# Patient Record
Sex: Female | Born: 1945 | Race: White | Hispanic: No | Marital: Single
Health system: Southern US, Community
[De-identification: ages and names within clinical notes are randomized; demographics above are authoritative.]

## PROBLEM LIST (undated history)

## (undated) DIAGNOSIS — M1990 Unspecified osteoarthritis, unspecified site: Secondary | ICD-10-CM

## (undated) DIAGNOSIS — H524 Presbyopia: Secondary | ICD-10-CM

## (undated) DIAGNOSIS — E119 Type 2 diabetes mellitus without complications: Secondary | ICD-10-CM

## (undated) DIAGNOSIS — E7800 Pure hypercholesterolemia, unspecified: Secondary | ICD-10-CM

## (undated) DIAGNOSIS — H269 Unspecified cataract: Secondary | ICD-10-CM

## (undated) DIAGNOSIS — F0390 Unspecified dementia without behavioral disturbance: Secondary | ICD-10-CM

## (undated) DIAGNOSIS — H9190 Unspecified hearing loss, unspecified ear: Secondary | ICD-10-CM

## (undated) DIAGNOSIS — I10 Essential (primary) hypertension: Secondary | ICD-10-CM

## (undated) DIAGNOSIS — G119 Hereditary ataxia, unspecified: Secondary | ICD-10-CM

## (undated) DIAGNOSIS — IMO0001 Reserved for inherently not codable concepts without codable children: Secondary | ICD-10-CM

## (undated) DIAGNOSIS — F29 Unspecified psychosis not due to a substance or known physiological condition: Secondary | ICD-10-CM

## (undated) DIAGNOSIS — Z794 Long term (current) use of insulin: Secondary | ICD-10-CM

## (undated) HISTORY — DX: Presbyopia: H52.4

## (undated) HISTORY — DX: Unspecified osteoarthritis, unspecified site: M19.90

## (undated) HISTORY — PX: OTHER SURGICAL HISTORY: SHX169

## (undated) HISTORY — DX: Essential (primary) hypertension: I10

---

## 1997-10-23 ENCOUNTER — Encounter (HOSPITAL_COMMUNITY): Admission: RE | Admit: 1997-10-23 | Discharge: 1998-01-21 | Payer: Self-pay | Admitting: Family Medicine

## 1999-06-06 ENCOUNTER — Emergency Department (HOSPITAL_COMMUNITY): Admission: EM | Admit: 1999-06-06 | Discharge: 1999-06-06 | Payer: Self-pay | Admitting: Emergency Medicine

## 2000-05-10 ENCOUNTER — Encounter: Payer: Self-pay | Admitting: Family Medicine

## 2000-05-10 ENCOUNTER — Encounter: Admission: RE | Admit: 2000-05-10 | Discharge: 2000-05-10 | Payer: Self-pay | Admitting: Family Medicine

## 2001-05-16 ENCOUNTER — Encounter: Payer: Self-pay | Admitting: Family Medicine

## 2001-05-16 ENCOUNTER — Encounter: Admission: RE | Admit: 2001-05-16 | Discharge: 2001-05-16 | Payer: Self-pay | Admitting: Family Medicine

## 2001-10-05 ENCOUNTER — Ambulatory Visit (HOSPITAL_COMMUNITY): Admission: RE | Admit: 2001-10-05 | Discharge: 2001-10-05 | Payer: Self-pay | Admitting: Family Medicine

## 2001-10-05 ENCOUNTER — Encounter: Payer: Self-pay | Admitting: Family Medicine

## 2002-08-15 ENCOUNTER — Encounter: Payer: Self-pay | Admitting: Family Medicine

## 2002-08-15 ENCOUNTER — Encounter: Admission: RE | Admit: 2002-08-15 | Discharge: 2002-08-15 | Payer: Self-pay | Admitting: Family Medicine

## 2003-08-18 ENCOUNTER — Encounter: Admission: RE | Admit: 2003-08-18 | Discharge: 2003-08-18 | Payer: Self-pay | Admitting: Family Medicine

## 2004-03-25 ENCOUNTER — Encounter: Admission: RE | Admit: 2004-03-25 | Discharge: 2004-03-25 | Payer: Self-pay | Admitting: General Surgery

## 2004-05-26 ENCOUNTER — Ambulatory Visit (HOSPITAL_COMMUNITY): Admission: RE | Admit: 2004-05-26 | Discharge: 2004-05-26 | Payer: Self-pay | Admitting: General Surgery

## 2004-05-26 ENCOUNTER — Encounter (INDEPENDENT_AMBULATORY_CARE_PROVIDER_SITE_OTHER): Payer: Self-pay | Admitting: *Deleted

## 2005-07-22 ENCOUNTER — Other Ambulatory Visit: Admission: RE | Admit: 2005-07-22 | Discharge: 2005-07-22 | Payer: Self-pay | Admitting: General Surgery

## 2008-06-16 ENCOUNTER — Encounter: Admission: RE | Admit: 2008-06-16 | Discharge: 2008-06-16 | Payer: Self-pay | Admitting: Otolaryngology

## 2008-11-21 ENCOUNTER — Encounter: Admission: RE | Admit: 2008-11-21 | Discharge: 2008-11-21 | Payer: Self-pay | Admitting: Family Medicine

## 2009-01-22 ENCOUNTER — Encounter: Admission: RE | Admit: 2009-01-22 | Discharge: 2009-01-22 | Payer: Self-pay | Admitting: Internal Medicine

## 2009-05-20 ENCOUNTER — Encounter: Admission: RE | Admit: 2009-05-20 | Discharge: 2009-05-20 | Payer: Self-pay | Admitting: Family Medicine

## 2009-11-13 ENCOUNTER — Ambulatory Visit: Payer: Self-pay | Admitting: Cardiology

## 2009-11-13 ENCOUNTER — Inpatient Hospital Stay (HOSPITAL_COMMUNITY): Admission: EM | Admit: 2009-11-13 | Discharge: 2009-11-17 | Payer: Self-pay | Admitting: Emergency Medicine

## 2009-11-27 DIAGNOSIS — E785 Hyperlipidemia, unspecified: Secondary | ICD-10-CM

## 2009-11-30 ENCOUNTER — Ambulatory Visit: Payer: Self-pay | Admitting: Cardiology

## 2009-12-15 ENCOUNTER — Encounter: Admission: RE | Admit: 2009-12-15 | Discharge: 2009-12-15 | Payer: Self-pay | Admitting: Family Medicine

## 2009-12-22 ENCOUNTER — Encounter (INDEPENDENT_AMBULATORY_CARE_PROVIDER_SITE_OTHER): Payer: Self-pay | Admitting: *Deleted

## 2009-12-22 ENCOUNTER — Ambulatory Visit: Payer: Self-pay | Admitting: Internal Medicine

## 2009-12-22 DIAGNOSIS — F79 Unspecified intellectual disabilities: Secondary | ICD-10-CM

## 2009-12-22 DIAGNOSIS — I495 Sick sinus syndrome: Secondary | ICD-10-CM | POA: Insufficient documentation

## 2009-12-22 DIAGNOSIS — F0390 Unspecified dementia without behavioral disturbance: Secondary | ICD-10-CM

## 2010-06-23 ENCOUNTER — Emergency Department (HOSPITAL_COMMUNITY)
Admission: EM | Admit: 2010-06-23 | Discharge: 2010-06-23 | Payer: Self-pay | Source: Home / Self Care | Admitting: Emergency Medicine

## 2010-08-24 NOTE — Procedures (Signed)
Summary: summary report  summary report   Imported By: Mirna Mires 12/14/2009 14:56:22  _____________________________________________________________________  External Attachment:    Type:   Image     Comment:   External Document

## 2010-08-24 NOTE — Miscellaneous (Signed)
  Clinical Lists Changes  Medications: Added new medication of PROVIGIL 200 MG TABS (MODAFINIL) take one daily - Signed Added new medication of SIMVASTATIN 10 MG TABS (SIMVASTATIN) take one daily Added new medication of GALANTAMINE HYDROBROMIDE 8 MG TABS (GALANTAMINE HYDROBROMIDE) take one twice daily Added new medication of NAMENDA 10 MG TABS (MEMANTINE HCL) take one daily

## 2010-08-24 NOTE — Assessment & Plan Note (Signed)
Summary: eph/  f/u 48 hrs holter/ gd   Visit Type:  Follow-up   History of Present Illness: This is a 65 year old female patient with advanced dementia, mental retardation, and severe hearing loss. She was brought to the emergency room because she was found to be bradycardic on routine vital checks. It was felt her bradycardia was likely due to the use of Galantamine which was stopped in the hospital.  She had a Holter monitor as an outpatient which her heart rates range from 48-118 with occasional PVCs, PACs but no pauses or arrhythmias. The patient is asymptomatic with this. She is brought today with an aid and denies complaints.  Problems Prior to Update: 1)  Hyperlipidemia  (ICD-272.4)  Current Medications (verified): 1)  Provigil 200 Mg Tabs (Modafinil) .... Take One Daily 2)  Simvastatin 10 Mg Tabs (Simvastatin) .... Take One Daily 3)  Namenda 10 Mg Tabs (Memantine Hcl) .... Take One Daily 4)  Multivitamins  Caps (Multiple Vitamin) .... Take One Daily 5)  Omeprazole 20 Mg Cpdr (Omeprazole) .... Take One Daily 6)  Zilactin 10 % Gel (Benzyl Alcohol) .... Apply Small Amt. On Tip of Tongue Three Times Daily As Needed 7)  Docusate Sodium 100 Mg Caps (Docusate Sodium) .... Take One Twice Daily  Family History: Reviewed history from 11/27/2009 and no changes required.  Noncontributory.      Social History: Reviewed history from 11/27/2009 and no changes required.   The patient currently lives in a group home in   [place], where she has lived since 1993.      Review of Systems       Patient complains of being cold all the time, and shoulder pain from a fall in the bathroom.  Vital Signs:  Patient profile:   65 year old female Height:      65 inches Weight:      136 pounds BMI:     22.71 Pulse rate:   72 / minute BP sitting:   120 / 66  (right arm)  Vitals Entered By: Jacquelin Hawking, CMA (Dec 22, 2009 1:24 PM)  Physical Exam  General:   Well-nournished, in no acute  distress. Neck: No JVD, HJR, Bruit, or thyroid enlargement Lungs: No tachypnea, clear without wheezing, rales, or rhonchi Cardiovascular: RRR, PMI not displaced, heart sounds normal, no murmurs, gallops, bruit, thrill, or heave. Abdomen: BS normal. Soft without organomegaly, masses, lesions or tenderness. Extremities: without cyanosis, clubbing or edema. Good distal pulses bilateral SKin: Warm, no lesions or rashes  Musculoskeletal: No deformities Neuro: no focal signs    Impression & Recommendations:  Problem # 1:  SINUS BRADYCARDIA (ICD-427.81) Patient had asymptomatic sinus bradycardia which was likely due to the use of galantamine. This was stopped. In reviewing her Holter monitor earlier this month she had heart rates from 48-118 with occasional PVCs and PACs and no pauses. We will continue to hold this medication.  Problem # 2:  MENTAL RETARDATION (ICD-319)  Problem # 3:  DEMENTIA (ICD-294.8)  Problem # 4:  HYPERLIPIDEMIA (ICD-272.4) treated Her updated medication list for this problem includes:    Simvastatin 10 Mg Tabs (Simvastatin) .Marland Kitchen... Take one daily  Patient Instructions: 1)  followup with Dr. Bonnee Quin as needed.

## 2010-10-12 LAB — BASIC METABOLIC PANEL
BUN: 16 mg/dL (ref 6–23)
CO2: 28 mEq/L (ref 19–32)
Glucose, Bld: 101 mg/dL — ABNORMAL HIGH (ref 70–99)
Potassium: 3.7 mEq/L (ref 3.5–5.1)
Sodium: 139 mEq/L (ref 135–145)

## 2010-10-12 LAB — COMPREHENSIVE METABOLIC PANEL
AST: 16 U/L (ref 0–37)
Albumin: 3.7 g/dL (ref 3.5–5.2)
CO2: 30 mEq/L (ref 19–32)
Chloride: 107 mEq/L (ref 96–112)
GFR calc Af Amer: >60 mL/min (ref >60)
Potassium: 3.4 mEq/L — ABNORMAL LOW (ref 3.5–5.1)
Sodium: 140 mEq/L (ref 135–145)
Total Protein: 6.4 g/dL (ref 6.0–8.3)

## 2010-10-12 LAB — DIFFERENTIAL
Basophils Relative: 1 % (ref 0–1)
Eosinophils Relative: 2 % (ref 0–5)
Monocytes Absolute: 0.5 10*3/uL (ref 0.1–1.0)
Neutro Abs: 1.9 10*3/uL (ref 1.7–7.7)
Neutrophils Relative %: 35 % — ABNORMAL LOW (ref 43–77)

## 2010-10-12 LAB — TROPONIN I: Troponin I: 0.01 ng/mL (ref 0.00–0.06)

## 2010-10-12 LAB — CARDIAC PANEL(CRET KIN+CKTOT+MB+TROPI)
CK, MB: 0.9 ng/mL (ref 0.3–4.0)
Total CK: 46 U/L (ref 7–177)
Troponin I: 0.01 ng/mL (ref 0.00–0.06)

## 2010-10-12 LAB — CBC
MCV: 84.7 fL (ref 78.0–100.0)
Platelets: 174 10*3/uL (ref 150–400)
RBC: 4.3 MIL/uL (ref 3.87–5.11)
WBC: 5.6 10*3/uL (ref 4.0–10.5)

## 2010-10-12 LAB — T4, FREE: Free T4: 1.02 ng/dL (ref 0.80–1.80)

## 2010-10-12 LAB — T3, FREE: T3, Free: 3.4 pg/mL (ref 2.3–4.2)

## 2010-10-12 LAB — POCT CARDIAC MARKERS
Myoglobin, poc: 47.2 ng/mL (ref 12–200)
Troponin i, poc: <0.05 ng/mL (ref 0.00–0.09)

## 2010-11-15 ENCOUNTER — Other Ambulatory Visit: Payer: Self-pay | Admitting: Family Medicine

## 2010-11-15 DIAGNOSIS — Z1231 Encounter for screening mammogram for malignant neoplasm of breast: Secondary | ICD-10-CM

## 2010-12-10 NOTE — Op Note (Signed)
Sherri Gross, ROZAR               ACCOUNT NO.:  [id number]   MEDICAL RECORD NO.:  [id number]          PATIENT TYPE:  AMB   LOCATION:  DAY                          FACILITY:  [facility]   PHYSICIAN:  Ollen Gross. [name] Morgans, M.D. 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   POSTOPERATIVE DIAGNOSIS:  Sarcoma of the right arm.   PROCEDURE:  Wide excision of sarcoma of the right arm.   SURGEON:  Ollen Gross. Carolynne Edouard, M.D.   ANESTHESIA:  General endotracheal.   PROCEDURE:  After informed consent was obtained, the patient was brought to  the operating room and placed in a supine position on the operating room  table.  After adequate induction of general anesthesia, the patient was then  placed in a lateral position with the left side down.  All pressure points  were padded.  The patient's right upper arm and shoulder area was prepped  with Betadine and draped in the usual sterile manner.  An approximately 1.5  to 2 cm was measured out all around her previous scar.  This area was then  excised sharply and in an elliptical fashion with a 15 blade knife and  electrocautery.  The dissection was taken down through the subcutaneous fat,  and the muscular fascia was taken with the specimen.  This was all done  sharply with the electrocautery.  Once the specimen had been completely  excised and removed, the specimen was sent to pathology for further  evaluation and oriented.  The wound was then examined and found to be  hemostatic.  The wound was then closed with a combination of interrupted 3-0  nylon vertical mattress stitches as well as simple 3-0 nylon interrupted  stitches.  Neosporin and sterile dressings were then applied.  The patient  tolerated the procedure well.  At the end of the case, all needle, sponge,  and instrument counts were correct.  Patient was then  awakened and taken to  the recovery room in stable condition.      PST/MEDQ  D:  05/27/2004  T:  05/27/2004  Job:  [id number]

## 2010-12-21 ENCOUNTER — Ambulatory Visit
Admission: RE | Admit: 2010-12-21 | Discharge: 2010-12-21 | Disposition: A | Discharge status: Home / Self Care | Payer: Medicare Other | Source: Ambulatory Visit | Attending: Family Medicine | Admitting: Family Medicine

## 2010-12-21 DIAGNOSIS — Z1231 Encounter for screening mammogram for malignant neoplasm of breast: Secondary | ICD-10-CM

## 2011-01-22 ENCOUNTER — Emergency Department (HOSPITAL_COMMUNITY)
Admission: EM | Admit: 2011-01-22 | Discharge: 2011-01-22 | Disposition: A | Discharge status: Home / Self Care | Payer: Medicare Other | Attending: Emergency Medicine | Admitting: Emergency Medicine

## 2011-01-22 ENCOUNTER — Emergency Department (HOSPITAL_COMMUNITY): Payer: Medicare Other

## 2011-01-22 DIAGNOSIS — G309 Alzheimer's disease, unspecified: Secondary | ICD-10-CM | POA: Insufficient documentation

## 2011-01-22 DIAGNOSIS — M25519 Pain in unspecified shoulder: Secondary | ICD-10-CM | POA: Insufficient documentation

## 2011-01-22 DIAGNOSIS — F0280 Dementia in other diseases classified elsewhere without behavioral disturbance: Secondary | ICD-10-CM | POA: Insufficient documentation

## 2011-01-22 DIAGNOSIS — S40019A Contusion of unspecified shoulder, initial encounter: Secondary | ICD-10-CM | POA: Insufficient documentation

## 2011-01-22 DIAGNOSIS — W19XXXA Unspecified fall, initial encounter: Secondary | ICD-10-CM | POA: Insufficient documentation

## 2011-01-22 DIAGNOSIS — F79 Unspecified intellectual disabilities: Secondary | ICD-10-CM | POA: Insufficient documentation

## 2011-11-03 ENCOUNTER — Other Ambulatory Visit: Payer: Self-pay | Admitting: Family Medicine

## 2011-11-03 DIAGNOSIS — Z1231 Encounter for screening mammogram for malignant neoplasm of breast: Secondary | ICD-10-CM

## 2011-12-26 ENCOUNTER — Ambulatory Visit
Admission: RE | Admit: 2011-12-26 | Discharge: 2011-12-26 | Disposition: A | Discharge status: Home / Self Care | Payer: Medicare Other | Source: Ambulatory Visit | Attending: Family Medicine | Admitting: Family Medicine

## 2011-12-26 DIAGNOSIS — Z1231 Encounter for screening mammogram for malignant neoplasm of breast: Secondary | ICD-10-CM

## 2012-04-03 ENCOUNTER — Ambulatory Visit (HOSPITAL_COMMUNITY)
Admission: RE | Admit: 2012-04-03 | Discharge: 2012-04-03 | Disposition: A | Discharge status: Home / Self Care | Payer: Medicare Other | Source: Ambulatory Visit | Attending: Family Medicine | Admitting: Family Medicine

## 2012-04-03 ENCOUNTER — Ambulatory Visit (HOSPITAL_COMMUNITY): Payer: Medicare Other

## 2012-04-03 DIAGNOSIS — R079 Chest pain, unspecified: Secondary | ICD-10-CM | POA: Insufficient documentation

## 2012-10-23 ENCOUNTER — Other Ambulatory Visit: Payer: Self-pay | Admitting: Family Medicine

## 2012-10-23 DIAGNOSIS — Z1231 Encounter for screening mammogram for malignant neoplasm of breast: Secondary | ICD-10-CM

## 2012-12-28 ENCOUNTER — Ambulatory Visit: Payer: Medicare Other

## 2012-12-31 ENCOUNTER — Ambulatory Visit
Admission: RE | Admit: 2012-12-31 | Discharge: 2012-12-31 | Disposition: A | Discharge status: Home / Self Care | Payer: Medicare Other | Source: Ambulatory Visit | Attending: Family Medicine | Admitting: Family Medicine

## 2012-12-31 DIAGNOSIS — Z1231 Encounter for screening mammogram for malignant neoplasm of breast: Secondary | ICD-10-CM

## 2013-05-29 ENCOUNTER — Emergency Department (HOSPITAL_COMMUNITY)
Admission: EM | Admit: 2013-05-29 | Discharge: 2013-05-29 | Disposition: A | Discharge status: Home / Self Care | Payer: Medicare Other | Attending: Emergency Medicine | Admitting: Emergency Medicine

## 2013-05-29 ENCOUNTER — Emergency Department (HOSPITAL_COMMUNITY): Payer: Medicare Other

## 2013-05-29 ENCOUNTER — Encounter (HOSPITAL_COMMUNITY): Payer: Self-pay | Admitting: Emergency Medicine

## 2013-05-29 DIAGNOSIS — Z794 Long term (current) use of insulin: Secondary | ICD-10-CM | POA: Insufficient documentation

## 2013-05-29 DIAGNOSIS — X58XXXA Exposure to other specified factors, initial encounter: Secondary | ICD-10-CM | POA: Insufficient documentation

## 2013-05-29 DIAGNOSIS — Z8639 Personal history of other endocrine, nutritional and metabolic disease: Secondary | ICD-10-CM | POA: Insufficient documentation

## 2013-05-29 DIAGNOSIS — Y929 Unspecified place or not applicable: Secondary | ICD-10-CM | POA: Insufficient documentation

## 2013-05-29 DIAGNOSIS — H9190 Unspecified hearing loss, unspecified ear: Secondary | ICD-10-CM | POA: Insufficient documentation

## 2013-05-29 DIAGNOSIS — IMO0002 Reserved for concepts with insufficient information to code with codable children: Secondary | ICD-10-CM | POA: Insufficient documentation

## 2013-05-29 DIAGNOSIS — E119 Type 2 diabetes mellitus without complications: Secondary | ICD-10-CM | POA: Insufficient documentation

## 2013-05-29 DIAGNOSIS — Y939 Activity, unspecified: Secondary | ICD-10-CM | POA: Insufficient documentation

## 2013-05-29 DIAGNOSIS — F0390 Unspecified dementia without behavioral disturbance: Secondary | ICD-10-CM | POA: Insufficient documentation

## 2013-05-29 DIAGNOSIS — IMO0001 Reserved for inherently not codable concepts without codable children: Secondary | ICD-10-CM

## 2013-05-29 DIAGNOSIS — Z862 Personal history of diseases of the blood and blood-forming organs and certain disorders involving the immune mechanism: Secondary | ICD-10-CM | POA: Insufficient documentation

## 2013-05-29 HISTORY — DX: Long term (current) use of insulin: Z79.4

## 2013-05-29 HISTORY — DX: Unspecified cataract: H26.9

## 2013-05-29 HISTORY — DX: Unspecified hearing loss, unspecified ear: H91.90

## 2013-05-29 HISTORY — DX: Hereditary ataxia, unspecified: G11.9

## 2013-05-29 HISTORY — DX: Unspecified psychosis not due to a substance or known physiological condition: F29

## 2013-05-29 HISTORY — DX: Pure hypercholesterolemia, unspecified: E78.00

## 2013-05-29 HISTORY — DX: Unspecified dementia, unspecified severity, without behavioral disturbance, psychotic disturbance, mood disturbance, and anxiety: F03.90

## 2013-05-29 HISTORY — DX: Type 2 diabetes mellitus without complications: E11.9

## 2013-05-29 HISTORY — DX: Reserved for inherently not codable concepts without codable children: IMO0001

## 2013-05-29 NOTE — ED Notes (Signed)
Per EMS: pt showed up at [facility] adult day care complaining of left hip pain when putting pressure on it. EMS states feels like there is a slight deformity to left hip.

## 2013-05-29 NOTE — ED Notes (Signed)
Patient walked well with care giver and I and care giver said she is a two person assist. But was well balanced on her feet.

## 2013-05-29 NOTE — ED Provider Notes (Signed)
CSN: [id number]     Arrival date & time 05/29/13  1423 History   First MD Initiated Contact with Patient 05/29/13 1459     Chief Complaint  Patient presents with  . Hip Pain    left   (Consider location/radiation/quality/duration/timing/severity/associated sxs/prior Treatment) HPI Comments: Patient brought to the ER for evaluation of hip pain. The patient currently lives in a group home. Staff report no known history of trauma or falls. Patient reportedly went to the clinic today and while at the clinic started to complain of hip pain. There is some discrepancy as to which hip she was complaining of. Staff at the group home felt that she was complaining of the right hip, but he does report that she complained of left hip pain during transport. Upon arrival to the ER, the patient does not complaining of any pain, but does not answer questions appropriately. She has a previous history of dementia and psychosis, cannot provide further information. Level V Caveat due to dementia and psychosis.  Patient is a 67 y.o. female presenting with hip pain.  Hip Pain    Past Medical History  Diagnosis Date  . Dementia   . Hearing loss   . Cerebellar ataxia   . Hypercholesteremia   . Psychotic disorder   . Cataracts, bilateral   . IDDM (insulin dependent diabetes mellitus)    History reviewed. No pertinent past surgical history. No family history on file. History  Substance Use Topics  . Smoking status: Unknown If Ever Smoked  . Smokeless tobacco: Not on file  . Alcohol Use: No   OB History   Grav Para Term Preterm Abortions TAB SAB Ect Mult Living                 Review of Systems  Unable to perform ROS: Dementia    Allergies  Tuberculin ppd  Home Medications  No current outpatient prescriptions on file. BP 187/81  Pulse 61  Temp(Src) 97.9 F (36.6 C) (Oral)  SpO2 98% Physical Exam  Constitutional: She is oriented to person, place, and time. She appears well-developed and  well-nourished. No distress.  HENT:  Head: Normocephalic and atraumatic.  Right Ear: Hearing normal.  Left Ear: Hearing normal.  Nose: Nose normal.  Mouth/Throat: Oropharynx is clear and moist and mucous membranes are normal.  Eyes: Conjunctivae and EOM are normal. Pupils are equal, round, and reactive to light.  Neck: Normal range of motion. Neck supple.  Cardiovascular: Regular rhythm, S1 normal and S2 normal.  Exam reveals no gallop and no friction rub.   No murmur heard. Pulmonary/Chest: Effort normal and breath sounds normal. No respiratory distress. She exhibits no tenderness.  Abdominal: Soft. Normal appearance and bowel sounds are normal. There is no hepatosplenomegaly. There is no tenderness. There is no rebound, no guarding, no tenderness at McBurney's point and negative Murphy's sign. No hernia.  Musculoskeletal: Normal range of motion.  Neurological: She is alert and oriented to person, place, and time. She has normal strength. No cranial nerve deficit or sensory deficit. Coordination normal. GCS eye subscore is 4. GCS verbal subscore is 5. GCS motor subscore is 6.  Skin: Skin is warm, dry and intact. No rash noted. No cyanosis.  Psychiatric: She has a normal mood and affect. Her speech is normal and behavior is normal. Thought content normal.    ED Course  Procedures (including critical care time) Labs Review Labs Reviewed - No data to display Imaging Review Dg Hip Bilateral W/pelvis  05/29/2013  CLINICAL DATA:  Bilateral hip pain, no injury  EXAM: BILATERAL HIP WITH PELVIS - 4+ VIEW  COMPARISON:  None  FINDINGS: Minimal narrowing of the hip joints.  SI joints symmetric.  Bones diffusely demineralized.  No acute fracture, dislocation, or bone destruction.  Soft tissues unremarkable.  IMPRESSION: No acute osseous findings.   Electronically Signed   By: Ulyses Southward M.D.   On: 05/29/2013 16:08    EKG Interpretation   None       MDM  Diagnosis: Hip pain,  resolved  Patient presents to the ER for possible hip injury. Patient had reportedly been complaining of right hip pain earlier today. EMS brought the patient to the ER complaining of left ear pain, according to them. Upon my examination, patient has no pain or tenderness. She has full range of motion of both hips without any pain. X-ray was negative of both hips. She is ambulating without difficulty here in the ER. I do not have any suspicion for occult fracture, no indication for CT scan. Patient will be discharged back to the group home.    Gilda Crease, MD 05/29/13 [phone]

## 2013-12-10 ENCOUNTER — Other Ambulatory Visit: Payer: Self-pay | Admitting: Family Medicine

## 2013-12-10 DIAGNOSIS — Z1231 Encounter for screening mammogram for malignant neoplasm of breast: Secondary | ICD-10-CM

## 2014-01-10 ENCOUNTER — Ambulatory Visit
Admission: RE | Admit: 2014-01-10 | Discharge: 2014-01-10 | Disposition: A | Discharge status: Home / Self Care | Payer: Medicare Other | Source: Ambulatory Visit | Attending: Family Medicine | Admitting: Family Medicine

## 2014-01-10 DIAGNOSIS — Z1231 Encounter for screening mammogram for malignant neoplasm of breast: Secondary | ICD-10-CM

## 2014-01-17 ENCOUNTER — Emergency Department (HOSPITAL_COMMUNITY)
Admission: EM | Admit: 2014-01-17 | Discharge: 2014-01-17 | Disposition: A | Discharge status: Home / Self Care | Payer: Medicare Other | Attending: Emergency Medicine | Admitting: Emergency Medicine

## 2014-01-17 ENCOUNTER — Encounter (HOSPITAL_COMMUNITY): Payer: Self-pay | Admitting: Emergency Medicine

## 2014-01-17 DIAGNOSIS — H9190 Unspecified hearing loss, unspecified ear: Secondary | ICD-10-CM | POA: Diagnosis not present

## 2014-01-17 DIAGNOSIS — R509 Fever, unspecified: Secondary | ICD-10-CM | POA: Diagnosis present

## 2014-01-17 DIAGNOSIS — E7800 Pure hypercholesterolemia, unspecified: Secondary | ICD-10-CM | POA: Diagnosis not present

## 2014-01-17 DIAGNOSIS — F0390 Unspecified dementia without behavioral disturbance: Secondary | ICD-10-CM | POA: Diagnosis not present

## 2014-01-17 DIAGNOSIS — Z79899 Other long term (current) drug therapy: Secondary | ICD-10-CM | POA: Insufficient documentation

## 2014-01-17 DIAGNOSIS — N390 Urinary tract infection, site not specified: Secondary | ICD-10-CM | POA: Insufficient documentation

## 2014-01-17 DIAGNOSIS — E119 Type 2 diabetes mellitus without complications: Secondary | ICD-10-CM | POA: Diagnosis not present

## 2014-01-17 LAB — URINE MICROSCOPIC-ADD ON

## 2014-01-17 LAB — URINALYSIS, ROUTINE W REFLEX MICROSCOPIC
BILIRUBIN URINE: NEGATIVE
Glucose, UA: NEGATIVE mg/dL
KETONES UR: NEGATIVE mg/dL
NITRITE: NEGATIVE
PROTEIN: NEGATIVE mg/dL
Specific Gravity, Urine: 1.024 (ref 1.005–1.030)
UROBILINOGEN UA: 1 mg/dL (ref 0.0–1.0)
pH: 7 (ref 5.0–8.0)

## 2014-01-17 LAB — CBC WITH DIFFERENTIAL/PLATELET
BASOS ABS: 0 10*3/uL (ref 0.0–0.1)
BASOS PCT: 0 % (ref 0–1)
Eosinophils Absolute: 0.1 10*3/uL (ref 0.0–0.7)
Eosinophils Relative: 2 % (ref 0–5)
HEMATOCRIT: 38.7 % (ref 36.0–46.0)
Hemoglobin: 12.8 g/dL (ref 12.0–15.0)
Lymphocytes Relative: 27 % (ref 12–46)
Lymphs Abs: 1.8 10*3/uL (ref 0.7–4.0)
MCH: 27.8 pg (ref 26.0–34.0)
MCHC: 33.1 g/dL (ref 30.0–36.0)
MCV: 84.1 fL (ref 78.0–100.0)
MONO ABS: 0.9 10*3/uL (ref 0.1–1.0)
Monocytes Relative: 14 % — ABNORMAL HIGH (ref 3–12)
NEUTROS ABS: 3.6 10*3/uL (ref 1.7–7.7)
NEUTROS PCT: 57 % (ref 43–77)
Platelets: 171 10*3/uL (ref 150–400)
RBC: 4.6 MIL/uL (ref 3.87–5.11)
RDW: 14.1 % (ref 11.5–15.5)
WBC: 6.5 10*3/uL (ref 4.0–10.5)

## 2014-01-17 LAB — I-STAT CHEM 8, ED
BUN: 18 mg/dL (ref 6–23)
CALCIUM ION: 1.21 mmol/L (ref 1.13–1.30)
Chloride: 102 mEq/L (ref 96–112)
Creatinine, Ser: 0.8 mg/dL (ref 0.50–1.10)
GLUCOSE: 116 mg/dL — AB (ref 70–99)
HEMATOCRIT: 41 % (ref 36.0–46.0)
HEMOGLOBIN: 13.9 g/dL (ref 12.0–15.0)
Potassium: 3.6 mEq/L — ABNORMAL LOW (ref 3.7–5.3)
Sodium: 145 mEq/L (ref 137–147)
TCO2: 25 mmol/L (ref 0–100)

## 2014-01-17 MED ORDER — CEPHALEXIN 500 MG PO CAPS
500.0000 mg | ORAL_CAPSULE | Freq: Once | ORAL | Status: AC
  Administered 2014-01-17: 500 mg via ORAL
  Filled 2014-01-17: qty 1

## 2014-01-17 MED ORDER — CEPHALEXIN 500 MG PO CAPS: 500.0000 mg | ORAL_CAPSULE | Freq: Four times a day (QID) | ORAL | Status: DC

## 2014-01-17 NOTE — ED Provider Notes (Signed)
Level V caveat patient mentally. History is obtained from Sherri Gross manager at a group home the patient was. Patient had temperature 100.4 this morning 1. She has not been coughing. She's had mild nasal congestion. No other associated symptoms. She looks at baseline per [name]. Treated with Tylenol 12 noon today. On exam no distress HEENT exam mucous membranes moist pink neck supple lungs clear auscultation abdomen nondistended nontender all 4 extremities without redness swelling or tenderness neurovascular intact skin warm dry no rash. Perineum and perirectal area appear normal  Doug SouSam Tayja Manzer, MD 01/17/14 1605

## 2014-01-17 NOTE — Discharge Instructions (Signed)

## 2014-01-17 NOTE — ED Notes (Signed)
Pt from group home.  Fever starting today at highest 100.4.  Pt unable to give history .  Group staff member with pt.  Tylenol given at 12 noon.  Unknown for urinary symptoms.  ? Cough/congestion.

## 2014-01-17 NOTE — ED Provider Notes (Signed)
CSN: [id number]     Arrival date & time 01/17/14  1405 History   First MD Initiated Contact with Patient 01/17/14 [phone]     Chief Complaint  Patient presents with  . Fever     (Consider location/radiation/quality/duration/timing/severity/associated sxs/prior Treatment) HPI Level V caveat- pt is non verbal  Patient to the ER from group home, it was noted that she had a temperature of 100.4 per triage nurse and the group home worker tells me 100.1 in the room. The patient is mostly non verbal but is walking. She was given Tylenol at 12 noon and by the time she arrived to the ED the temperature had improved to 98.5. Other members in the group home have coughing, the patient has had some nasal congestion but denies any other complaints. The group home says that she has been using the restroom but did not eat breakfast well this morning. Acting as baseline per family group home workers.    Past Medical History  Diagnosis Date  . Dementia   . Hearing loss   . Cerebellar ataxia   . Hypercholesteremia   . Psychotic disorder   . Cataracts, bilateral   . IDDM (insulin dependent diabetes mellitus)    History reviewed. No pertinent past surgical history. History reviewed. No pertinent family history. History  Substance Use Topics  . Smoking status: Unknown If Ever Smoked  . Smokeless tobacco: Not on file  . Alcohol Use: No   OB History   Grav Para Term Preterm Abortions TAB SAB Ect Mult Living                 Review of Systems  Level V caveat- pt is non verbal  Allergies  Galantamine and Tuberculin ppd  Home Medications   Prior to Admission medications   Medication Sig Start Date End Date Taking? Authorizing Provider  acetaminophen (TYLENOL) 650 MG CR tablet Take 650 mg by mouth every 8 (eight) hours as needed for pain.   Yes Historical Provider, MD  calcium-vitamin D (OSCAL WITH D) 500-200 MG-UNIT per tablet Take 1 tablet by mouth 2 (two) times daily.   Yes Historical Provider,  MD  magnesium hydroxide (MILK OF MAGNESIA) 400 MG/5ML suspension Take 30 mLs by mouth every other day.   Yes Historical Provider, MD  memantine (NAMENDA) 10 MG tablet Take 10 mg by mouth 2 (two) times daily.   Yes Historical Provider, MD  NONFORMULARY OR COMPOUNDED ITEM Apply 1 application topically 2 (two) times daily. Tri fungal cream   Yes Historical Provider, MD  omeprazole (PRILOSEC) 20 MG capsule Take 20 mg by mouth daily.   Yes Historical Provider, MD  polyethylene glycol (MIRALAX / GLYCOLAX) packet Take 17 g by mouth daily.   Yes Historical Provider, MD  simvastatin (ZOCOR) 20 MG tablet Take 20 mg by mouth at bedtime.   Yes Historical Provider, MD  amLODipine (NORVASC) 2.5 MG tablet Take 2.5 mg by mouth daily.    Historical Provider, MD  cephALEXin (KEFLEX) 500 MG capsule Take 1 capsule (500 mg total) by mouth 4 (four) times daily. 01/17/14   Tiffany Irine SealG Greene, PA-C   BP 152/76  Pulse 70  Temp(Src) 98.5 F (36.9 C) (Rectal)  Resp 18  SpO2 96% Physical Exam  Nursing note and vitals reviewed. Constitutional: She appears well-developed and well-nourished. No distress.  HENT:  Head: Normocephalic and atraumatic.  Eyes: Pupils are equal, round, and reactive to light.  Neck: Normal range of motion. Neck supple.  Cardiovascular: Normal rate  and regular rhythm.   Pulmonary/Chest: Effort normal and breath sounds normal.  Abdominal: Soft. There is no tenderness.  Neurological: She is alert.  Skin: Skin is warm and dry.    ED Course  Procedures (including critical care time) Labs Review Labs Reviewed  URINALYSIS, ROUTINE W REFLEX MICROSCOPIC - Abnormal; Notable for the following:    Hgb urine dipstick TRACE (*)    Leukocytes, UA MODERATE (*)    All other components within normal limits  CBC WITH DIFFERENTIAL - Abnormal; Notable for the following:    Monocytes Relative 14 (*)    All other components within normal limits  I-STAT CHEM 8, ED - Abnormal; Notable for the following:     Potassium 3.6 (*)    Glucose, Bld 116 (*)    All other components within normal limits  URINE CULTURE  URINE MICROSCOPIC-ADD ON    Imaging Review No results found.   EKG Interpretation None      MDM   Final diagnoses:  UTI (lower urinary tract infection)    Patient discussed with Dr. Ethelda ChickJacubowitz, who has seen the patient as well. He recommends checking her urine by in and out cath, i-stat chem 8 and cbc w/dif. Otherwise he agrees with my assessment that she appear well.   Patients urine shows possible UTI, vitals re-checked, no fever rectally, BP is 152/76, pulse 70, Pt will be started on Keflex and has been given a PO dose in the ED. Urine culture sent out  68 y.o.Sherri Gross's evaluation in the Emergency Department is complete. It has been determined that no acute conditions requiring further emergency intervention are present at this time. The patient/guardian have been advised of the diagnosis and plan. We have discussed signs and symptoms that warrant return to the ED, such as changes or worsening in symptoms.  Vital signs are stable at discharge. Filed Vitals:   01/17/14 1728  BP: 152/76  Pulse:   Temp: 98.5 F (36.9 C)  Resp:     Patient/guardian has voiced understanding and agreed to follow-up with the PCP or specialist.    Dorthula Matasiffany G Greene, PA-C 01/17/14 1738  Dorthula Matasiffany G Greene, PA-C 01/17/14 1755

## 2014-01-18 LAB — URINE CULTURE
Colony Count: NO GROWTH
Culture: NO GROWTH

## 2014-01-18 NOTE — ED Provider Notes (Signed)
Medical screening examination/treatment/procedure(s) were conducted as a shared visit with non-physician practitioner(s) and myself.  I personally evaluated the patient during the encounter.   EKG Interpretation None       Doug SouSam Jacubowitz, MD 01/18/14 [phone]

## 2014-03-19 ENCOUNTER — Other Ambulatory Visit: Payer: Self-pay | Admitting: Family Medicine

## 2014-03-19 ENCOUNTER — Ambulatory Visit
Admission: RE | Admit: 2014-03-19 | Discharge: 2014-03-19 | Disposition: A | Discharge status: Home / Self Care | Payer: Medicare Other | Source: Ambulatory Visit | Attending: Family Medicine | Admitting: Family Medicine

## 2014-03-19 DIAGNOSIS — M1990 Unspecified osteoarthritis, unspecified site: Secondary | ICD-10-CM

## 2014-06-06 ENCOUNTER — Other Ambulatory Visit: Payer: Self-pay | Admitting: Family Medicine

## 2014-06-06 ENCOUNTER — Ambulatory Visit
Admission: RE | Admit: 2014-06-06 | Discharge: 2014-06-06 | Disposition: A | Discharge status: Home / Self Care | Payer: Medicare Other | Source: Ambulatory Visit | Attending: Family Medicine | Admitting: Family Medicine

## 2014-06-06 DIAGNOSIS — Z9289 Personal history of other medical treatment: Secondary | ICD-10-CM

## 2014-06-06 DIAGNOSIS — M25551 Pain in right hip: Secondary | ICD-10-CM

## 2014-06-06 DIAGNOSIS — M25552 Pain in left hip: Secondary | ICD-10-CM

## 2014-09-29 ENCOUNTER — Emergency Department (HOSPITAL_COMMUNITY)
Admission: EM | Admit: 2014-09-29 | Discharge: 2014-09-29 | Disposition: A | Discharge status: Home / Self Care | Payer: Medicare Other | Attending: Emergency Medicine | Admitting: Emergency Medicine

## 2014-09-29 ENCOUNTER — Encounter (HOSPITAL_COMMUNITY): Payer: Self-pay | Admitting: Emergency Medicine

## 2014-09-29 DIAGNOSIS — H269 Unspecified cataract: Secondary | ICD-10-CM | POA: Diagnosis not present

## 2014-09-29 DIAGNOSIS — S20419A Abrasion of unspecified back wall of thorax, initial encounter: Secondary | ICD-10-CM | POA: Insufficient documentation

## 2014-09-29 DIAGNOSIS — Z8739 Personal history of other diseases of the musculoskeletal system and connective tissue: Secondary | ICD-10-CM | POA: Diagnosis not present

## 2014-09-29 DIAGNOSIS — Z79899 Other long term (current) drug therapy: Secondary | ICD-10-CM | POA: Diagnosis not present

## 2014-09-29 DIAGNOSIS — Y92128 Other place in nursing home as the place of occurrence of the external cause: Secondary | ICD-10-CM | POA: Diagnosis not present

## 2014-09-29 DIAGNOSIS — E119 Type 2 diabetes mellitus without complications: Secondary | ICD-10-CM | POA: Diagnosis not present

## 2014-09-29 DIAGNOSIS — H9190 Unspecified hearing loss, unspecified ear: Secondary | ICD-10-CM | POA: Diagnosis not present

## 2014-09-29 DIAGNOSIS — W19XXXA Unspecified fall, initial encounter: Secondary | ICD-10-CM

## 2014-09-29 DIAGNOSIS — E780 Pure hypercholesterolemia: Secondary | ICD-10-CM | POA: Diagnosis not present

## 2014-09-29 DIAGNOSIS — F0390 Unspecified dementia without behavioral disturbance: Secondary | ICD-10-CM | POA: Diagnosis not present

## 2014-09-29 DIAGNOSIS — Z792 Long term (current) use of antibiotics: Secondary | ICD-10-CM | POA: Diagnosis not present

## 2014-09-29 DIAGNOSIS — Y9389 Activity, other specified: Secondary | ICD-10-CM | POA: Insufficient documentation

## 2014-09-29 DIAGNOSIS — Y9210 Unspecified residential institution as the place of occurrence of the external cause: Secondary | ICD-10-CM

## 2014-09-29 DIAGNOSIS — W1839XA Other fall on same level, initial encounter: Secondary | ICD-10-CM | POA: Insufficient documentation

## 2014-09-29 DIAGNOSIS — S3992XA Unspecified injury of lower back, initial encounter: Secondary | ICD-10-CM | POA: Diagnosis present

## 2014-09-29 DIAGNOSIS — Y998 Other external cause status: Secondary | ICD-10-CM | POA: Diagnosis not present

## 2014-09-29 HISTORY — DX: Unspecified osteoarthritis, unspecified site: M19.90

## 2014-09-29 NOTE — ED Notes (Signed)
MD at bedside. 

## 2014-09-29 NOTE — ED Notes (Signed)
Urine specimen given if needed.

## 2014-09-29 NOTE — Progress Notes (Signed)
CSW met with pt at bedside. Pt was not very communicative. Pt appeared to be extremely hard of hearing. Supervisor of [facility] was present. Supervisor confirms that pt comes from [place], and states that pt has been living there for at least 15 years. Also, she states that the pt has a good support system, which includes her sister.  Supervisor informed CSW that pt presents to [facility] due to fall. Supervisor states the pt turned in the opposite direction of her walker and slid down. Supervisor states that staff was able to catch pt before she fell to the floor.  Supervisor informed CSW that pt needs assistance completing ADL's, However, she states pt has been able to feed herself independently.   Lavern Ellison/Supervisor [phone]  Willette Brace [phone] ED CSW 09/29/2014 4:25 PM

## 2014-09-29 NOTE — ED Provider Notes (Signed)
CSN: [id number]     Arrival date & time 09/29/14  1346 History   First MD Initiated Contact with Patient 09/29/14 1507     Chief Complaint  Patient presents with  . Fall     (Consider location/radiation/quality/duration/timing/severity/associated sxs/prior Treatment) HPI Comments: Ms. Sherri Gross is a 69 year old female with history of moderate mental retardation, ataxia, and dementia who was being assisted via a platform walker at her care facility when she lost her balance. She started to fall and was assisted to the floor by her aide.  During the fall, she stuck her mid-lower back on the wheel of the walker, resulting in a superficial abrasion to her back.  Patient is a 69 y.o. female presenting with fall. The history is provided by a caregiver and medical records. No language interpreter was used.  Fall This is a new problem. The current episode started today. The problem occurs rarely.    Past Medical History  Diagnosis Date  . Dementia   . Hearing loss   . Cerebellar ataxia   . Hypercholesteremia   . Psychotic disorder   . Cataracts, bilateral   . IDDM (insulin dependent diabetes mellitus)   . Arthritis    History reviewed. No pertinent past surgical history. No family history on file. History  Substance Use Topics  . Smoking status: Unknown If Ever Smoked  . Smokeless tobacco: Not on file  . Alcohol Use: No   OB History    No data available     Review of Systems  Unable to perform ROS: Dementia      Allergies  Galantamine and Tuberculin ppd  Home Medications   Prior to Admission medications   Medication Sig Start Date End Date Taking? Authorizing Provider  amLODipine (NORVASC) 2.5 MG tablet Take 2.5 mg by mouth at bedtime.    Yes Historical Provider, MD  BENZYL ALCOHOL, ANESTHETIC, 10 % GEL Use as directed 1 application in the mouth or throat 3 (three) times daily as needed (for ulcers).   Yes Historical Provider, MD  calcium-vitamin D (OSCAL WITH D) 500-200  MG-UNIT per tablet Take 1 tablet by mouth 2 (two) times daily.   Yes Historical Provider, MD  Diphenhyd-Hydrocort-Nystatin (FIRST-DUKES MOUTHWASH) SUSP Use as directed 15 mLs in the mouth or throat 2 (two) times daily.   Yes Historical Provider, MD  magnesium hydroxide (MILK OF MAGNESIA) 400 MG/5ML suspension Take 30 mLs by mouth every other day.   Yes Historical Provider, MD  memantine (NAMENDA) 10 MG tablet Take 10 mg by mouth 2 (two) times daily.   Yes Historical Provider, MD  omeprazole (PRILOSEC) 20 MG capsule Take 20 mg by mouth daily with breakfast.    Yes Historical Provider, MD  polyethylene glycol (MIRALAX / GLYCOLAX) packet Take 17 g by mouth daily with breakfast.    Yes Historical Provider, MD  simvastatin (ZOCOR) 20 MG tablet Take 20 mg by mouth at bedtime.   Yes Historical Provider, MD  Sunscreens (BLISTEX LIP BALM) 2-2.5-6.6 % STCK Apply 1 application topically 3 (three) times daily. Use's for chapped lips   Yes Historical Provider, MD  Vaginal Lubricant (REPLENS) GEL Place 1 applicator vaginally once a week.   Yes Historical Provider, MD  cephALEXin (KEFLEX) 500 MG capsule Take 1 capsule (500 mg total) by mouth 4 (four) times daily. Patient not taking: Reported on 09/29/2014 01/17/14   Marlon Peliffany Greene, PA-C   BP 148/65 mmHg  Pulse 72  Temp(Src) 98.2 F (36.8 C) (Oral)  Resp 20  SpO2 97% Physical Exam  Constitutional: She appears well-developed and well-nourished.  HENT:  Head: Normocephalic and atraumatic.  Neck: Neck supple.  Cardiovascular: Normal rate and regular rhythm.   Pulmonary/Chest: Effort normal and breath sounds normal.  Abdominal: Soft. Bowel sounds are normal.  Musculoskeletal: She exhibits no edema.  Lymphadenopathy:    She has no cervical adenopathy.  Neurological:  Awake, responds to questions with a smile, stating she hurt her back.  She is at her normal baseline per the care provider.  Skin:     Psychiatric: Her mood appears not anxious.  Nursing note  and vitals reviewed.   ED Course  Procedures (including critical care time) Labs Review Labs Reviewed - No data to display  Imaging Review No results found.   EKG Interpretation None     Patient evaluated in ED today after a controlled fall at her care facility.  Patient assisted to floor after losing balance while being assisted in a platform walker.  Patient has a superficial abrasion to the midline aspect of the lower thoracic spine.  No tenderness to palpation of spine on exam.  Witnessed the patient stand to pivot into a wheelchair--she has an unsteady gait, but is at her baseline according to care provider at the bedside.  No recent change in her health status.  Patient discussed with Dr. Donnald Garre.  Patient discharged home to her care facility with instructions/return precautions provided to staff. MDM   Final diagnoses:  None    Fall. Abrasion to back.    Felicie Morn, NP 09/30/14 1610  Arby Barrette, MD 10/03/14 [phone]

## 2014-09-29 NOTE — Discharge Instructions (Signed)

## 2014-09-29 NOTE — ED Notes (Signed)
NP at bedside.

## 2014-09-29 NOTE — ED Notes (Signed)
[facility] Expected date:  Expected time:  Means of arrival:  Comments: EMS- witness fall, "lowered to the ground"

## 2014-09-29 NOTE — ED Notes (Signed)
69 yo from nursing home with c/o of fall. Pt was lowered to the ground with gait belt and ambulatory aid. Pt has an abrasion on lower lumbar area of back. Bleeding controlled. No LOC,BLOOD Thinners. Pt is oriented to self and environment.

## 2014-12-11 ENCOUNTER — Other Ambulatory Visit: Payer: Self-pay

## 2014-12-11 DIAGNOSIS — Z1231 Encounter for screening mammogram for malignant neoplasm of breast: Secondary | ICD-10-CM

## 2015-01-12 ENCOUNTER — Ambulatory Visit
Admission: RE | Admit: 2015-01-12 | Discharge: 2015-01-12 | Disposition: A | Discharge status: Home / Self Care | Payer: Medicare Other | Source: Ambulatory Visit

## 2015-01-12 DIAGNOSIS — Z1231 Encounter for screening mammogram for malignant neoplasm of breast: Secondary | ICD-10-CM

## 2016-02-18 ENCOUNTER — Other Ambulatory Visit: Payer: Self-pay | Admitting: Family Medicine

## 2016-02-18 DIAGNOSIS — Z1231 Encounter for screening mammogram for malignant neoplasm of breast: Secondary | ICD-10-CM

## 2016-03-02 ENCOUNTER — Ambulatory Visit
Admission: RE | Admit: 2016-03-02 | Discharge: 2016-03-02 | Disposition: A | Discharge status: Home / Self Care | Payer: Medicare Other | Source: Ambulatory Visit | Attending: Family Medicine | Admitting: Family Medicine

## 2016-03-02 DIAGNOSIS — Z1231 Encounter for screening mammogram for malignant neoplasm of breast: Secondary | ICD-10-CM

## 2016-03-24 ENCOUNTER — Ambulatory Visit (INDEPENDENT_AMBULATORY_CARE_PROVIDER_SITE_OTHER): Payer: Medicare Other | Admitting: Neurology

## 2016-03-24 ENCOUNTER — Encounter: Payer: Self-pay | Admitting: Neurology

## 2016-03-24 VITALS — BP 122/70 | HR 56 | Temp 98.3°F

## 2016-03-24 DIAGNOSIS — Z8673 Personal history of transient ischemic attack (TIA), and cerebral infarction without residual deficits: Secondary | ICD-10-CM

## 2016-03-24 DIAGNOSIS — G8114 Spastic hemiplegia affecting left nondominant side: Secondary | ICD-10-CM | POA: Diagnosis not present

## 2016-03-24 DIAGNOSIS — I6930 Unspecified sequelae of cerebral infarction: Secondary | ICD-10-CM

## 2016-03-24 NOTE — Patient Instructions (Signed)
Remember to drink plenty of fluid, eat healthy meals and do not skip any meals. Try to eat protein with a every meal and eat a healthy snack such as fruit or nuts in between meals. Try to keep a regular sleep-wake schedule and try to exercise daily, particularly in the form of walking, 20-30 minutes a day, if you can.   As far as diagnostic testing: MRI of the brain  I would like to see you back in 4 months, sooner if we need to. Please call us with any interim questions, concerns, problems, updates or refill requests.   Our phone number is [phone]. We also have an after hours call service for urgent matters and there is a physician on-call for urgent questions. For any emergencies you know to call 911 or go to the nearest emergency room

## 2016-03-24 NOTE — Progress Notes (Signed)
[facility]    Provider:  Dr Lucia Gaskins Referring Provider: Dayna Barker Sherri Began, MD Primary Care Physician:  Lucretia Field  CC:  Possible stroke  HPI:  Sherri Gross is a 70 y.o. female here as a referral from Dr. Dayna Barker for possible stroke. Sherri Gross is a 70 year old female with history of moderate mental retardation, ataxia, and Alzheimer's dementia, hearing loss, hypercholesterolemia, hypertension, myopia, osteoporosis, psychotic disorder . She is here with caregiver who works at the facility. Patient is a poor historian. No family members in attendance. Caretaker has known her for a year. She is very forgetful, she has dementia, significantly hearing impaired. Caretaker says the left hand symptoms have been ongoing over a year unclear when it started or the etiology thought it was arthritic. She cannot open up her hand. Increased tone. Weakness in her arm. The left arm is sore a lot. Left hand is tender to the touch. Worse when they are trying to clean the hand. No skin changes or rashes or swelling. No neck pain or decrease in ROM. Speech is not clear. Left arm worsening she is not able to open hand, becoming more stiff at the hand not the arm, slowly progressively worsening.She can;t wlk without assistance. Left leg is fine. No injuries are known. Caretaker also concerned for her memory and dementia. Unknown family history due to patient's cognitive status; she is a poor historian and cannot obtain this information attempted.  Reviewed notes, labs and imaging from outside physicians, which showed:   Per notes from Dr. Merlyn Lot who is an orthopedic surgeon sending patient here for evaluation of stroke. She was seen for several months of contractures of the left hand digits. No known injuries. Progressively worsening. She describes pain in the digits. It is bothersome especially when attempting extension of the fingers. When they try to place a warm rag in the palm can become  painful. Her symptoms are aggravated by motion and extension. No known injury or history of stroke. 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 Concern was for stroke. Physical therapy and occupational therapy was ordered. She was seen in physical therapy. Reviewed physical therapy notes.Pt was evaluated and she and her caretaker were educated and instructed on passive hand exercises with digit extension, active wrist exercises, and active elbow exercises. Pt's caretaker was also educated on keeping a washcloth in pt's left palm on an ongoing basis for the purposes of pt's hand/digit extension and advised to change the washcloth 1-2x/day to assist with pt hygiene and skin integrity. Caretaker communicated that pt (with the assistance of caretakers) would be working on these advised exercises and activities and would return to therapy should they need further guidance. Reviewed occupational therapy notes as well:Copies of exercise sheets were printed out for caretaker and therapist assisted pt in the performance of 10 reps of all exercises while caretaker observed. Therapist also demonstrated placing a washcloth in pt's left hand to assist with contracture as well as hygiene. Caretaker was educated on Immunologist for pt's left palm so as not to compromise her skin integrity. Pt and caretaker will return to therapy if needed for further guidance.   BUN 18 and creatinine 0.8 in June 2015 (most recent labs available)  Review of Systems: Patient complains of symptoms per HPI as well as the following symptoms: no CP, no SOB. Pertinent negatives per HPI. All others negative.   Social History   Social History  . Marital status: Single  Spouse name: N/A  . Number of children: N/A  . Years of education: N/A   Occupational History  . disabled    Social History Main Topics  . Smoking status: Unknown If Ever Smoked  . Smokeless tobacco: Never Used  . Alcohol use No  . Drug use: No    . Sexual activity: Not on file   Other Topics Concern  . Not on file   Social History Narrative   Lives [facility], [place]      Caffeine use:     No family history on file.  Past Medical History:  Diagnosis Date  . Arthritis   . Cataracts, bilateral   . Cerebellar ataxia (HCC)   . Dementia   . Hearing loss   . Hypercholesteremia   . IDDM (insulin dependent diabetes mellitus) (HCC)   . Psychotic disorder     Past Surgical History:  Procedure Laterality Date  . denies surgical history      Current Outpatient Prescriptions  Medication Sig Dispense Refill  . amLODipine (NORVASC) 2.5 MG tablet Take 2.5 mg by mouth at bedtime.     . benzocaine (ORAJEL) 10 % mucosal gel Use as directed 1 application in the mouth or throat 3 (three) times daily as needed for mouth pain.    . Cholecalciferol (VITAMIN D3) 2000 units TABS Take 2 capsules by mouth daily.    . fenofibrate (TRICOR) 145 MG tablet Take 145 mg by mouth daily.    . magnesium hydroxide (MILK OF MAGNESIA) 400 MG/5ML suspension Take 30 mLs by mouth every other day.    . memantine (NAMENDA) 10 MG tablet Take 10 mg by mouth 2 (two) times daily.    . polyethylene glycol (MIRALAX / GLYCOLAX) packet Take 17 g by mouth daily with breakfast.     . simvastatin (ZOCOR) 20 MG tablet Take 20 mg by mouth at bedtime.    . Sunscreens (BLISTEX LIP BALM) 2-2.5-6.6 % STCK Apply 1 application topically 3 (three) times daily. Use's for chapped lips    . tizanidine (ZANAFLEX) 2 MG capsule Take 2 mg by mouth 2 (two) times daily.    . Triamcinolone Acetonide (TRIAMCINOLONE 0.1 % CREAM : EUCERIN) CREA Apply 1 application topically 2 (two) times daily.    . Vaginal Lubricant (REPLENS) GEL Place 1 applicator vaginally once a week. Every friday     No current facility-administered medications for this visit.     Allergies as of 03/24/2016 - Review Complete 03/24/2016  Allergen Reaction Noted  . Galantamine  05/29/2013  .  Tuberculin ppd  05/29/2013    Vitals: BP 122/70 (BP Location: Right Arm, Patient Position: Sitting, Cuff Size: Normal)   Pulse (!) 56   Temp 98.3 F (36.8 C) (Oral)  Last Weight:  Wt Readings from Last 1 Encounters:  12/22/09 136 lb (61.7 kg)   Last Height:   Ht Readings from Last 1 Encounters:  12/22/09 5\' 5"  (1.651 m)    Physical exam: Exam: Gen: NAD, c                    CV: RRR, no MRG. No Carotid Bruits. No peripheral edema, warm, nontender Eyes: Conjunctivae clear without exudates or hemorrhage  Neuro: Detailed Neurologic Exam  Speech:    Speech is largely mute but dysarthric with impaired comprehension.  Cognition:    The patient is oriented to person,    recent and remote memory impaired;     language dysarthric;  Impaired attention, concentration,     fund of knowledge impaired Cranial Nerves:    The pupils are equal, round, and reactive to light. Attempted fundoscopic exam could not viaualze due to small pupils. Impaired vertical movements. Trigeminal sensation is intact and the muscles of mastication are normal. The face is asymmetric, she has left lower facial droop and left ptosis. Attempted was unable to evaluate the uvula oropharynx for elevation due to patient's left facial droop. Hearing impaired. Voice is hypophonic. Shoulder shrug is normal. The tongue has normal motion without fasciculations.   Coordination:    No dysmetria noted but difficult exam cannot follow directions  Gait:    Cannot walk without assistance or bear weight without assistance attempted  Motor Observation:    Left contracture and fisting left hand with finger flexion and wrist flexion and flexion at the elbow.  Tone:    Increased left hand and arm  Posture:    Posture is slightly stooped    Strength:    Difficult strength exam cannot follow directions due to cognitive deficits but all limbs anti-gravity with  weakness of the left arm     Sensation: Attempted appears  intact to LT and pin prick difficult to examine distribution or asymmetry due to cognitive deficits and impaired communication due to speech     Reflex Exam:  DTR's:    Deep tendon reflexes are brisker on the left  Toes:    The toes are equivocal bilaterally.   Clonus:    Clonus is absent.      Assessment/Plan:  70 year old with left arm weakness, sensory changes, increased tone sent for evaluation of stroke. She does appear to have facial asymmetry mostly on the left, relative weakness as compared to other limbs on the left, increased tone of the left arm. I recommend MRI of the brain if patient can tolerate, can give her 0.5mg  xanax 30-60 minutes prior to exam and can give her addition if needed.  Also recommend labs including LDL and HgbA1c if positive for stroke and management of vascular risk factors. We'll send note to the nursing facility and order MRI of the brain, we'll ask physician therefore recommendations on keeping patient still during MRI if she needs Xanax or other sedating agent. We'll also request any recent lab work including hemoglobin A1c, LDL, CMP or CBC. Will refer to Dr. Riley KillSwartz to see if botox can be used to relax patients left grip for easier maintenance and hygiene.  Cc: [place], [place]  Sherri DeanAntonia Shaelyn Decarli, MD  [facility] [address] [place], [place] [id number]  Phone [phone] Fax [phone]

## 2016-03-27 ENCOUNTER — Encounter: Payer: Self-pay | Admitting: Neurology

## 2016-03-27 DIAGNOSIS — G8114 Spastic hemiplegia affecting left nondominant side: Secondary | ICD-10-CM | POA: Insufficient documentation

## 2016-03-29 ENCOUNTER — Encounter: Payer: Self-pay | Admitting: *Deleted

## 2016-03-29 NOTE — Progress Notes (Signed)
[facility] at [phone]. Received confirmation.

## 2016-04-18 ENCOUNTER — Ambulatory Visit: Payer: Medicare Other | Admitting: Physical Medicine & Rehabilitation

## 2016-07-27 ENCOUNTER — Encounter: Payer: Self-pay | Admitting: Nurse Practitioner

## 2016-07-27 ENCOUNTER — Ambulatory Visit (INDEPENDENT_AMBULATORY_CARE_PROVIDER_SITE_OTHER): Payer: Medicare Other | Admitting: Nurse Practitioner

## 2016-07-27 VITALS — BP 128/70 | HR 66

## 2016-07-27 DIAGNOSIS — R413 Other amnesia: Secondary | ICD-10-CM | POA: Diagnosis not present

## 2016-07-27 DIAGNOSIS — F79 Unspecified intellectual disabilities: Secondary | ICD-10-CM | POA: Diagnosis not present

## 2016-07-27 DIAGNOSIS — G8114 Spastic hemiplegia affecting left nondominant side: Secondary | ICD-10-CM | POA: Diagnosis not present

## 2016-07-27 DIAGNOSIS — E785 Hyperlipidemia, unspecified: Secondary | ICD-10-CM | POA: Diagnosis not present

## 2016-07-27 NOTE — Progress Notes (Signed)
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, 71 year old female returns for follow-up with her caregiver. At baseline she has moderate mental retardation ataxia Alzheimer's dementia hypertension hypercholesterolemia and psychotic disorder. She has weakness in her left arm and hand. . She was initially evaluated for stroke by Dr. Lucia GaskinsAhern 03/06/2016. MRI of the brain was ordered at that time however the group home [facility] called [facility]. Speech is not clear. She ambulates with the assist of 2. Her physical therapies have concluded. She has good appetite and sleeps well according to the caregiver. She  returns for reevaluation  HISTORY 03/24/16 Ulyses AmorAWilma L Gross is a 71 y.o. female here as a referral from Dr. Dayna Barkeroyals for possible stroke. Ms. [name] is a 71 year old female with history of moderate mental retardation, ataxia, and Alzheimer's dementia, hearing loss, hypercholesterolemia, hypertension, myopia, osteoporosis, psychotic disorder . She is here with caregiver who works at the facility. Patient is a poor historian. No family members in attendance. Caretaker has known her for a year. She is very forgetful, she has dementia, significantly hearing impaired. Caretaker says the left hand symptoms have been ongoing over a year unclear when it started or the etiology thought it was arthritic. She cannot open up her hand. Increased tone. Weakness in her arm. The left arm is sore a lot. Left hand is tender to the touch. Worse when they are trying to clean the hand. No skin changes or rashes or swelling. No neck pain or decrease in ROM. Speech is not clear. Left arm worsening she is not able to open hand, becoming more stiff at the hand not the arm, slowly progressively worsening.She  can;t wlk without assistance. Left leg is fine. No injuries are known. Caretaker also concerned for her memory and dementia. Unknown family history due to patient's cognitive status; she is a poor historian and cannot obtain this information attempted.  REVIEW OF SYSTEMS: Full 14 system review of systems performed and notable only for those listed, all others are neg:  Constitutional: neg  Cardiovascular: neg Ear/Nose/Throat: neg  Skin: neg Eyes: neg Respiratory: neg Gastroitestinal: neg  Hematology/Lymphatic: neg  Endocrine: neg Musculoskeletal: Walking difficulty Allergy/Immunology: neg Neurological: Baseline dementia Psychiatric: neg Sleep : neg   ALLERGIES: Allergies  Allergen Reactions  . Galantamine     Unknown per mar  . Tuberculin Ppd     unknown    HOME MEDICATIONS: Outpatient Medications Prior to Visit  Medication Sig Dispense Refill  . amLODipine (NORVASC) 2.5 MG tablet Take 2.5 mg by mouth at bedtime.     . benzocaine (ORAJEL) 10 % mucosal gel Use as directed 1 application in the mouth or throat 3 (three) times daily as needed for mouth pain.    . Cholecalciferol (VITAMIN D3) 2000 units TABS Take 2 capsules by mouth daily.    . fenofibrate (TRICOR) 145 MG tablet Take 145 mg by mouth daily.    . magnesium hydroxide (MILK OF MAGNESIA) 400 MG/5ML suspension Take 30 mLs by mouth every other day.    . memantine (NAMENDA) 10 MG tablet Take 10 mg by mouth 2 (two) times daily.    . polyethylene glycol (MIRALAX / GLYCOLAX) packet Take 17 g by mouth daily with breakfast.     . simvastatin (ZOCOR) 20 MG tablet Take 20 mg by  mouth at bedtime.    . Sunscreens (BLISTEX LIP BALM) 2-2.5-6.6 % STCK Apply 1 application topically 3 (three) times daily. Use's for chapped lips    . tizanidine (ZANAFLEX) 2 MG capsule Take 2 mg by mouth 2 (two) times daily.    . Triamcinolone Acetonide (TRIAMCINOLONE 0.1 % CREAM : EUCERIN) CREA Apply 1 application topically 2 (two) times daily.    .  Vaginal Lubricant (REPLENS) GEL Place 1 applicator vaginally once a week. Every friday     No facility-administered medications prior to visit.     PAST MEDICAL HISTORY: Past Medical History:  Diagnosis Date  . Arthritis   . Cataracts, bilateral   . Cerebellar ataxia (HCC)   . Dementia   . Hearing loss   . Hypercholesteremia   . Hypertension   . IDDM (insulin dependent diabetes mellitus) (HCC)   . Osteoarthritis   . Presbyopia   . Psychotic disorder     PAST SURGICAL HISTORY: Past Surgical History:  Procedure Laterality Date  . denies surgical history      FAMILY HISTORY: History reviewed. No pertinent family history.  SOCIAL HISTORY: Social History   Social History  . Marital status: Single    Spouse name: N/A  . Number of children: N/A  . Years of education: N/A   Occupational History  . disabled    Social History Main Topics  . Smoking status: Unknown If Ever Smoked  . Smokeless tobacco: Never Used  . Alcohol use No  . Drug use: No  . Sexual activity: Not on file   Other Topics Concern  . Not on file   Social History Narrative   Lives [facility] [place]      Caffeine use:      PHYSICAL EXAM  Vitals:   07/27/16 1011  BP: 128/70  Pulse: 66   There is no height or weight on file to calculate BMI.  Generalized: Well developed, in no acute distress  Head: normocephalic and atraumatic,. Oropharynx benign  Neck: Supple, no carotid bruits  Cardiac: Regular rate rhythm, no murmur  Musculoskeletal: Contracture left hand  Neurological examination   Mentation: Alert oriented to person, recent and remote memory impaired language dysarthric,  does not follow  Commands.   Cranial nerve II-XII: Fundoscopic exam unable to visualize Pupils were equal round reactive to light extraocular movements were full, visual field were full on confrontational test. Facial strength with left lower facial droop and mild left ptosis  Uvula tongue  midline. Hearing is impaired ,head turning and shoulder shrug were normal . Motor: Left contracture left hand with finger flexion and wrist flexion and flexion at the elbow , range of motion appears normal other extremities does not follow commands Sensory: Withdraws to pain Coordination: Does not follow commands  Reflexes: Brisker on the left, plantar responses were flexor bilaterally. Gait and Station: In wheelchair not ambulated   DIAGNOSTIC DATA (LABS, IMAGING, TESTING) - I reviewed patient records, labs, notes, testing and imaging myself where available.    ASSESSMENT AND PLAN  71 year old with left arm weakness, sensory changes, increased tone sent for evaluation of stroke. She does appear to have facial asymmetry mostly on the left, relative weakness as compared to other limbs on the left, increased tone of the left arm. MRI of the brain has not been done, can give her 0.5mg  xanax 30-60 minutes prior to exam and can give her addition if needed.   Asked  caregiver to send any recent  labs in the last 6 months  including hemoglobin A1c, LDL, CMP or CBC. Caregiver given the number to schedule with [facility] imaging MRI of the brainThe patient is a current patient of Dr. Lucia Gaskins who is out of the office today . This note is sent to the work in doctor.     Plan Will get recent labs such as hemoglobin A1c and lipid profile   Schedule MRI of the brain this is to be done at a group home order  in the system F/U in 3 to  6 months Call group home when  results are back for further treatment options etc. Greater than 50% of time during this 25 minute visit was spent on explanation of diagnosis, planning of further management, discussion with caregiver and coordination of care Nilda Riggs, [facility], [facility], APRN  [facility] [address] [place], [place] [address] [phone]

## 2016-07-27 NOTE — Patient Instructions (Signed)
Will get labs  Schedule MRI of the brain F/U in 6 months

## 2016-07-28 ENCOUNTER — Encounter: Payer: Self-pay | Admitting: *Deleted

## 2016-07-28 ENCOUNTER — Telehealth: Payer: Self-pay | Admitting: *Deleted

## 2016-07-28 LAB — COMPREHENSIVE METABOLIC PANEL
ALT: 24 IU/L (ref 0–32)
AST: 22 IU/L (ref 0–40)
Albumin/Globulin Ratio: 1.9 (ref 1.2–2.2)
Albumin: 4 g/dL (ref 3.5–4.8)
Alkaline Phosphatase: 67 IU/L (ref 39–117)
BUN/Creatinine Ratio: 28 (ref 12–28)
BUN: 23 mg/dL (ref 8–27)
Bilirubin Total: 0.3 mg/dL (ref 0.0–1.2)
CO2: 24 mmol/L (ref 18–29)
CREATININE: 0.82 mg/dL (ref 0.57–1.00)
Calcium: 9.4 mg/dL (ref 8.7–10.3)
Chloride: 104 mmol/L (ref 96–106)
GFR calc Af Amer: 83 mL/min/{1.73_m2} (ref >59)
GFR, EST NON AFRICAN AMERICAN: 72 mL/min/{1.73_m2} (ref >59)
Globulin, Total: 2.1 g/dL (ref 1.5–4.5)
Glucose: 85 mg/dL (ref 65–99)
Potassium: 4 mmol/L (ref 3.5–5.2)
Sodium: 145 mmol/L — ABNORMAL HIGH (ref 134–144)
TOTAL PROTEIN: 6.1 g/dL (ref 6.0–8.5)

## 2016-07-28 LAB — CBC WITH DIFFERENTIAL/PLATELET
Basophils Absolute: 0 10*3/uL (ref 0.0–0.2)
Basos: 0 %
EOS (ABSOLUTE): 0.2 10*3/uL (ref 0.0–0.4)
EOS: 3 %
HEMATOCRIT: 39 % (ref 34.0–46.6)
Hemoglobin: 13.1 g/dL (ref 11.1–15.9)
IMMATURE GRANULOCYTES: 0 %
Immature Grans (Abs): 0 10*3/uL (ref 0.0–0.1)
Lymphocytes Absolute: 2.9 10*3/uL (ref 0.7–3.1)
Lymphs: 46 %
MCH: 28.8 pg (ref 26.6–33.0)
MCHC: 33.6 g/dL (ref 31.5–35.7)
MCV: 86 fL (ref 79–97)
MONOCYTES: 9 %
Monocytes Absolute: 0.6 10*3/uL (ref 0.1–0.9)
NEUTROS PCT: 42 %
Neutrophils Absolute: 2.7 10*3/uL (ref 1.4–7.0)
PLATELETS: 249 10*3/uL (ref 150–379)
RBC: 4.55 x10E6/uL (ref 3.77–5.28)
RDW: 14.5 % (ref 12.3–15.4)
WBC: 6.4 10*3/uL (ref 3.4–10.8)

## 2016-07-28 NOTE — Telephone Encounter (Signed)
-----   Message from Nilda RiggsNancy Carolyn Martin, NP sent at 07/28/2016  8:11 AM EST ----- Labs ok please call

## 2016-07-28 NOTE — Telephone Encounter (Signed)
Mailed copy of results to pts group home.

## 2016-07-29 NOTE — Progress Notes (Signed)
I agree with the above plan 

## 2016-08-16 ENCOUNTER — Other Ambulatory Visit: Payer: Medicare Other

## 2016-08-17 ENCOUNTER — Ambulatory Visit
Admission: RE | Admit: 2016-08-17 | Discharge: 2016-08-17 | Disposition: A | Discharge status: Home / Self Care | Payer: Medicare Other | Source: Ambulatory Visit | Attending: Neurology | Admitting: Neurology

## 2016-08-17 DIAGNOSIS — I6930 Unspecified sequelae of cerebral infarction: Secondary | ICD-10-CM | POA: Diagnosis not present

## 2016-08-17 DIAGNOSIS — G8114 Spastic hemiplegia affecting left nondominant side: Secondary | ICD-10-CM

## 2016-08-22 ENCOUNTER — Telehealth: Payer: Self-pay | Admitting: *Deleted

## 2016-08-22 NOTE — Telephone Encounter (Signed)
[facility], got busy signal. Will try later.  [facility].

## 2016-08-22 NOTE — Telephone Encounter (Signed)
-----   Message from Anson FretAntonia B Ahern, MD sent at 08/21/2016  2:38 PM EST ----- Patient has severe cerebellar atrophy but this can;t explain her symptoms. She should probably have an MRI of the cervical spine please see if they are willing to do this thanls

## 2016-08-25 NOTE — Telephone Encounter (Signed)
[facility] [place] Relayed I could not contact cecilia. Verified I had correct number.  She asked we fax results to them at [phone] so they can forward to PCP.

## 2016-08-25 NOTE — Telephone Encounter (Signed)
Faxed copy of MRI results to [facility]

## 2017-01-31 ENCOUNTER — Ambulatory Visit (INDEPENDENT_AMBULATORY_CARE_PROVIDER_SITE_OTHER): Payer: Medicare Other | Admitting: Nurse Practitioner

## 2017-01-31 ENCOUNTER — Encounter: Payer: Self-pay | Admitting: Nurse Practitioner

## 2017-01-31 VITALS — BP 133/72 | HR 62 | Wt 153.6 lb

## 2017-01-31 DIAGNOSIS — F0280 Dementia in other diseases classified elsewhere without behavioral disturbance: Secondary | ICD-10-CM | POA: Diagnosis not present

## 2017-01-31 DIAGNOSIS — G8114 Spastic hemiplegia affecting left nondominant side: Secondary | ICD-10-CM | POA: Diagnosis not present

## 2017-01-31 DIAGNOSIS — G309 Alzheimer's disease, unspecified: Secondary | ICD-10-CM

## 2017-01-31 DIAGNOSIS — F79 Unspecified intellectual disabilities: Secondary | ICD-10-CM

## 2017-01-31 NOTE — Patient Instructions (Signed)
Per group home sheet 

## 2017-01-31 NOTE — Progress Notes (Signed)
[facility]  PATIENT: Sherri Gross DOB: 1945/07/30   REASON FOR VISIT: Follow-up for left arm weakness, sensory changes HISTORY FROM: Patient and caregiver from group home    HISTORY OF PRESENT ILLNESS:UPDATE 07/10/2018CM Ms. Sherri RoachKindly, 71 year old female returns for follow-up with her caregiver. At baseline she has moderate mental retardation and Alzheimer's dementia. MRI of the brain was obtained 08/17/2016 Abnormal MRI brain (without) demonstrating: 1. Mild perisylvian and severe cerebellar atrophy. 2. Mild periventricular and subcortical hazy T2FLAIR hyperintensities. 3. No acute findings. Patient requires assistance for transfers and some assistance for performing ADLs. She has good appetite and sleeping well no hallucinations. She is very hard of hearing. She continues to have weakness in the left arm which is been ongoing for several years now. She returns for reevaluation  UPDATE 01/03/2018CM Ms. Sherri RoachKindly, 71 year old female returns for follow-up with her caregiver. At baseline she has moderate mental retardation ataxia Alzheimer's dementia hypertension hypercholesterolemia and psychotic disorder. She has weakness in her left arm and hand. . She was initially evaluated for stroke by Dr. Lucia GaskinsAhern 03/06/2016. MRI of the brain was ordered at that time however the group home [facility] called [facility]. Speech is not clear. She ambulates with the assist of 2. Her physical therapies have concluded. She has good appetite and sleeps well according to the caregiver. She  returns for reevaluation  HISTORY 03/24/16 [name] is a 71 y.o. female here as a referral from Dr. Dayna Barkeroyals for possible stroke. Ms. [name] is a 71 year old female with history of moderate mental retardation, ataxia, and Alzheimer's dementia, hearing loss, hypercholesterolemia, hypertension, myopia, osteoporosis, psychotic disorder . She is here with caregiver who works at  the facility. Patient is a poor historian. No family members in attendance. Caretaker has known her for a year. She is very forgetful, she has dementia, significantly hearing impaired. Caretaker says the left hand symptoms have been ongoing over a year unclear when it started or the etiology thought it was arthritic. She cannot open up her hand. Increased tone. Weakness in her arm. The left arm is sore a lot. Left hand is tender to the touch. Worse when they are trying to clean the hand. No skin changes or rashes or swelling. No neck pain or decrease in ROM. Speech is not clear. Left arm worsening she is not able to open hand, becoming more stiff at the hand not the arm, slowly progressively worsening.She can;t wlk without assistance. Left leg is fine. No injuries are known. Caretaker also concerned for her memory and dementia. Unknown family history due to patient's cognitive status; she is a poor historian and cannot obtain this information attempted.  REVIEW OF SYSTEMS: Full 14 system review of systems performed and notable only for those listed, all others are neg:  Constitutional: neg  Cardiovascular: neg Ear/Nose/Throat: Hard of hearing Skin: neg Eyes: neg Respiratory: neg Gastroitestinal: neg  Hematology/Lymphatic: neg  Endocrine: neg Musculoskeletal: Walking difficulty Allergy/Immunology: neg Neurological: Baseline dementia Psychiatric: neg Sleep : neg   ALLERGIES: Allergies  Allergen Reactions  . Galantamine     Unknown per mar  . Tuberculin Ppd     unknown    HOME MEDICATIONS: Outpatient Medications Prior to Visit  Medication Sig Dispense Refill  . amLODipine (NORVASC) 2.5 MG tablet Take 2.5 mg by mouth at bedtime.     . benzocaine (ORAJEL) 10 % mucosal gel Use as directed 1 application in the mouth or throat 3 (three) times daily as  needed for mouth pain.    . Cholecalciferol (VITAMIN D3) 2000 units TABS Take 2 capsules by mouth daily.    . fenofibrate (TRICOR) 145 MG  tablet Take 145 mg by mouth daily.    . magnesium hydroxide (MILK OF MAGNESIA) 400 MG/5ML suspension Take 30 mLs by mouth every other day.    . memantine (NAMENDA) 10 MG tablet Take 10 mg by mouth 2 (two) times daily.    . polyethylene glycol (MIRALAX / GLYCOLAX) packet Take 17 g by mouth daily with breakfast.     . simvastatin (ZOCOR) 20 MG tablet Take 20 mg by mouth at bedtime.    . Sunscreens (BLISTEX LIP BALM) 2-2.5-6.6 % STCK Apply 1 application topically 3 (three) times daily. Use's for chapped lips    . tizanidine (ZANAFLEX) 2 MG capsule Take 2 mg by mouth 2 (two) times daily.    . Triamcinolone Acetonide (TRIAMCINOLONE 0.1 % CREAM : EUCERIN) CREA Apply 1 application topically 2 (two) times daily.    . Vaginal Lubricant (REPLENS) GEL Place 1 applicator vaginally once a week. Every friday     No facility-administered medications prior to visit.     PAST MEDICAL HISTORY: Past Medical History:  Diagnosis Date  . Arthritis   . Cataracts, bilateral   . Cerebellar ataxia (HCC)   . Dementia   . Hearing loss   . Hypercholesteremia   . Hypertension   . IDDM (insulin dependent diabetes mellitus) (HCC)   . Osteoarthritis   . Presbyopia   . Psychotic disorder     PAST SURGICAL HISTORY: Past Surgical History:  Procedure Laterality Date  . denies surgical history      FAMILY HISTORY: History reviewed. No pertinent family history.  SOCIAL HISTORY: Social History   Social History  . Marital status: Single    Spouse name: N/A  . Number of children: N/A  . Years of education: N/A   Occupational History  . disabled    Social History Main Topics  . Smoking status: Unknown If Ever Smoked  . Smokeless tobacco: Never Used  . Alcohol use No  . Drug use: No  . Sexual activity: Not on file   Other Topics Concern  . Not on file   Social History Narrative   Lives [facility] [place]      Caffeine use:      PHYSICAL EXAM  Vitals:   01/31/17 0949  BP:  133/72  Pulse: 62  Weight: 153 lb 9.6 oz (69.7 kg)   Body mass index is 25.56 kg/m.  Generalized: Well developed, in no acute distress  Head: normocephalic and atraumatic,. Oropharynx benign  Neck: Supple, no carotid bruits  Cardiac: Regular rate rhythm, no murmur  Musculoskeletal: Contracture left hand  Neurological examination   Mentation: Alert oriented to person, recent and remote memory impaired language dysarthric,  does not follow  Commands.   Cranial nerve II-XII: Fundoscopic exam unable to visualize Pupils were equal round reactive to light extraocular movements were full, visual field were full on confrontational test. Facial strength with left lower facial droop and mild left ptosis  Uvula tongue midline. Hearing is impaired ,head turning and shoulder shrug were normal . Motor: Left contracture left hand with finger flexion and wrist flexion and flexion at the elbow , range of motion appears normal other extremities , does not follow commands Sensory: Withdraws to pain Coordination: Does not follow commands  Reflexes: Brisker on the left, plantar responses were flexor bilaterally. Gait and  Station: In wheelchair not ambulated   DIAGNOSTIC DATA (LABS, IMAGING, TESTING) - I reviewed patient records, labs, notes, testing and imaging myself where available.    ASSESSMENT AND PLAN  71 year old with left arm weakness, sensory changes, increased tone sent for evaluation of stroke. She does appear to have facial asymmetry mostly on the left, relative weakness as compared to other limbs on the left, increased tone of the left arm. MRI of the brain was obtained 08/17/2016 Abnormal MRI brain (without) demonstrating: 1. Mild perisylvian and severe cerebellar atrophy. 2. Mild periventricular and subcortical hazy T2FLAIR hyperintensities. 3. No acute findings.      Plan Discussed with Dr. Lucia Gaskins Reviewed MRI of the brain with  Supervisor at [facility] group home. Continue supportive  care Discussed with Dr. Lucia Gaskins no further follow-up planned  Greater than 50% of time during this 25 minute visit was spent on explanation of diagnosis, reviewing MRI of the brain planning of further management, discussion with caregiver and coordination of care Nilda Riggs, [facility], [facility], APRN  [facility] [address] [place], [place] [address] [phone]

## 2017-02-04 NOTE — Progress Notes (Signed)
Personally  participated in, made any corrections needed, and agree with history, physical, neuro exam,assessment and plan as stated.     Bhavya Grand, MD [facility]     

## 2017-05-25 ENCOUNTER — Other Ambulatory Visit: Payer: Self-pay | Admitting: Family Medicine

## 2017-05-25 DIAGNOSIS — Z1231 Encounter for screening mammogram for malignant neoplasm of breast: Secondary | ICD-10-CM

## 2017-06-22 ENCOUNTER — Other Ambulatory Visit: Payer: Self-pay | Admitting: Family Medicine

## 2017-06-22 ENCOUNTER — Ambulatory Visit
Admission: RE | Admit: 2017-06-22 | Discharge: 2017-06-22 | Disposition: A | Discharge status: Home / Self Care | Payer: Medicare Other | Source: Ambulatory Visit | Attending: Family Medicine | Admitting: Family Medicine

## 2017-06-22 DIAGNOSIS — Z1231 Encounter for screening mammogram for malignant neoplasm of breast: Secondary | ICD-10-CM

## 2018-05-03 ENCOUNTER — Other Ambulatory Visit: Payer: Self-pay | Admitting: Family Medicine

## 2018-05-03 DIAGNOSIS — Z1231 Encounter for screening mammogram for malignant neoplasm of breast: Secondary | ICD-10-CM

## 2018-06-25 ENCOUNTER — Ambulatory Visit
Admission: RE | Admit: 2018-06-25 | Discharge: 2018-06-25 | Disposition: A | Discharge status: Home / Self Care | Payer: Medicare Other | Source: Ambulatory Visit | Attending: Family Medicine | Admitting: Family Medicine

## 2018-06-25 DIAGNOSIS — Z1231 Encounter for screening mammogram for malignant neoplasm of breast: Secondary | ICD-10-CM

## 2019-08-13 ENCOUNTER — Other Ambulatory Visit: Payer: Self-pay | Admitting: Family Medicine

## 2019-08-13 DIAGNOSIS — Z1231 Encounter for screening mammogram for malignant neoplasm of breast: Secondary | ICD-10-CM

## 2019-10-09 ENCOUNTER — Other Ambulatory Visit: Payer: Self-pay

## 2019-10-09 ENCOUNTER — Ambulatory Visit
Admission: RE | Admit: 2019-10-09 | Discharge: 2019-10-09 | Disposition: A | Discharge status: Home / Self Care | Payer: Medicare Other | Source: Ambulatory Visit | Attending: Family Medicine | Admitting: Family Medicine

## 2019-10-09 DIAGNOSIS — Z1231 Encounter for screening mammogram for malignant neoplasm of breast: Secondary | ICD-10-CM

## 2020-03-02 ENCOUNTER — Inpatient Hospital Stay (HOSPITAL_COMMUNITY)
Admission: EM | Admit: 2020-03-02 | Discharge: 2020-03-09 | DRG: 689 | Disposition: A | Discharge status: Home / Self Care | Payer: Medicare Other | Attending: Internal Medicine | Admitting: Internal Medicine

## 2020-03-02 ENCOUNTER — Encounter (HOSPITAL_COMMUNITY): Payer: Self-pay

## 2020-03-02 ENCOUNTER — Emergency Department (HOSPITAL_COMMUNITY): Payer: Medicare Other

## 2020-03-02 ENCOUNTER — Other Ambulatory Visit: Payer: Self-pay

## 2020-03-02 DIAGNOSIS — Z794 Long term (current) use of insulin: Secondary | ICD-10-CM

## 2020-03-02 DIAGNOSIS — E119 Type 2 diabetes mellitus without complications: Secondary | ICD-10-CM | POA: Diagnosis present

## 2020-03-02 DIAGNOSIS — I69934 Monoplegia of upper limb following unspecified cerebrovascular disease affecting left non-dominant side: Secondary | ICD-10-CM

## 2020-03-02 DIAGNOSIS — E7800 Pure hypercholesterolemia, unspecified: Secondary | ICD-10-CM | POA: Diagnosis present

## 2020-03-02 DIAGNOSIS — N390 Urinary tract infection, site not specified: Principal | ICD-10-CM | POA: Diagnosis present

## 2020-03-02 DIAGNOSIS — G309 Alzheimer's disease, unspecified: Secondary | ICD-10-CM | POA: Diagnosis not present

## 2020-03-02 DIAGNOSIS — Z6820 Body mass index (BMI) 20.0-20.9, adult: Secondary | ICD-10-CM

## 2020-03-02 DIAGNOSIS — U071 COVID-19: Secondary | ICD-10-CM | POA: Diagnosis not present

## 2020-03-02 DIAGNOSIS — F71 Moderate intellectual disabilities: Secondary | ICD-10-CM | POA: Diagnosis present

## 2020-03-02 DIAGNOSIS — F79 Unspecified intellectual disabilities: Secondary | ICD-10-CM

## 2020-03-02 DIAGNOSIS — I10 Essential (primary) hypertension: Secondary | ICD-10-CM | POA: Diagnosis present

## 2020-03-02 DIAGNOSIS — Z8673 Personal history of transient ischemic attack (TIA), and cerebral infarction without residual deficits: Secondary | ICD-10-CM

## 2020-03-02 DIAGNOSIS — G9340 Encephalopathy, unspecified: Secondary | ICD-10-CM | POA: Diagnosis not present

## 2020-03-02 DIAGNOSIS — Z993 Dependence on wheelchair: Secondary | ICD-10-CM

## 2020-03-02 DIAGNOSIS — G9349 Other encephalopathy: Secondary | ICD-10-CM | POA: Diagnosis present

## 2020-03-02 DIAGNOSIS — F0390 Unspecified dementia without behavioral disturbance: Secondary | ICD-10-CM | POA: Diagnosis present

## 2020-03-02 DIAGNOSIS — Z1612 Extended spectrum beta lactamase (ESBL) resistance: Secondary | ICD-10-CM | POA: Diagnosis present

## 2020-03-02 DIAGNOSIS — R64 Cachexia: Secondary | ICD-10-CM | POA: Diagnosis present

## 2020-03-02 DIAGNOSIS — J189 Pneumonia, unspecified organism: Secondary | ICD-10-CM

## 2020-03-02 DIAGNOSIS — F0280 Dementia in other diseases classified elsewhere without behavioral disturbance: Secondary | ICD-10-CM | POA: Diagnosis present

## 2020-03-02 DIAGNOSIS — B9620 Unspecified Escherichia coli [E. coli] as the cause of diseases classified elsewhere: Secondary | ICD-10-CM | POA: Diagnosis present

## 2020-03-02 DIAGNOSIS — H9190 Unspecified hearing loss, unspecified ear: Secondary | ICD-10-CM | POA: Diagnosis present

## 2020-03-02 LAB — SARS CORONAVIRUS 2 BY RT PCR (HOSPITAL ORDER, PERFORMED IN ~~LOC~~ HOSPITAL LAB): SARS Coronavirus 2: POSITIVE — AB

## 2020-03-02 LAB — COMPREHENSIVE METABOLIC PANEL
ALT: 16 U/L (ref 0–44)
AST: 23 U/L (ref 15–41)
Albumin: 4.1 g/dL (ref 3.5–5.0)
Alkaline Phosphatase: 46 U/L (ref 38–126)
Anion gap: 11 (ref 5–15)
BUN: 24 mg/dL — ABNORMAL HIGH (ref 8–23)
CO2: 24 mmol/L (ref 22–32)
Calcium: 8.9 mg/dL (ref 8.9–10.3)
Chloride: 108 mmol/L (ref 98–111)
Creatinine, Ser: 0.58 mg/dL (ref 0.44–1.00)
GFR calc Af Amer: >60 mL/min (ref >60)
GFR calc non Af Amer: >60 mL/min (ref >60)
Glucose, Bld: 108 mg/dL — ABNORMAL HIGH (ref 70–99)
Potassium: 3.5 mmol/L (ref 3.5–5.1)
Sodium: 143 mmol/L (ref 135–145)
Total Bilirubin: 0.5 mg/dL (ref 0.3–1.2)
Total Protein: 7.3 g/dL (ref 6.5–8.1)

## 2020-03-02 LAB — CBC
HCT: 40.5 % (ref 36.0–46.0)
Hemoglobin: 12.9 g/dL (ref 12.0–15.0)
MCH: 29.8 pg (ref 26.0–34.0)
MCHC: 31.9 g/dL (ref 30.0–36.0)
MCV: 93.5 fL (ref 80.0–100.0)
Platelets: 162 10*3/uL (ref 150–400)
RBC: 4.33 MIL/uL (ref 3.87–5.11)
RDW: 13.5 % (ref 11.5–15.5)
WBC: 7.6 10*3/uL (ref 4.0–10.5)
nRBC: 0 % (ref 0.0–0.2)

## 2020-03-02 LAB — URINALYSIS, ROUTINE W REFLEX MICROSCOPIC
Bilirubin Urine: NEGATIVE
Glucose, UA: NEGATIVE mg/dL
Hgb urine dipstick: NEGATIVE
Ketones, ur: NEGATIVE mg/dL
Leukocytes,Ua: NEGATIVE
Nitrite: POSITIVE — AB
Protein, ur: 30 mg/dL — AB
Specific Gravity, Urine: 1.021 (ref 1.005–1.030)
pH: 6 (ref 5.0–8.0)

## 2020-03-02 NOTE — ED Notes (Signed)
[facility] attempted to be contacted no answer. HIPAA appropriate voicemail was left.

## 2020-03-02 NOTE — ED Provider Notes (Signed)
[facility] EMERGENCY DEPARTMENT Provider Note  CSN: [id number] Arrival date & time: 03/02/20 1748    History Chief Complaint  Patient presents with  . Altered Mental Status    HPI  Sherri Gross is a 74 y.o. female with history of moderate mental retardation, prior stroke and alzheimer's, brought to the ED via EMS from her LTCF with a report of change in baseline mental status, increased somnolence and decreased PO intake since yesterday. Patient does not provide any additional history. Noted to have a cough on arrival, is reportedly vaccinated for Covid. Noted to have a cough on arrival.    Past Medical History:  Diagnosis Date  . Arthritis   . Cataracts, bilateral   . Cerebellar ataxia (HCC)   . Dementia (HCC)   . Hearing loss   . Hypercholesteremia   . Hypertension   . IDDM (insulin dependent diabetes mellitus)   . Osteoarthritis   . Presbyopia   . Psychotic disorder [facility])     Past Surgical History:  Procedure Laterality Date  . denies surgical history      No family history on file.  Social History   Tobacco Use  . Smoking status: Unknown If Ever Smoked  . Smokeless tobacco: Never Used  Substance Use Topics  . Alcohol use: No  . Drug use: No     Home Medications Prior to Admission medications   Medication Sig Start Date End Date Taking? Authorizing Provider  amLODipine (NORVASC) 2.5 MG tablet Take 2.5 mg by mouth at bedtime.     [provider]  benzocaine (ORAJEL) 10 % mucosal gel Use as directed 1 application in the mouth or throat 3 (three) times daily as needed for mouth pain.    [provider]  Cholecalciferol (VITAMIN D3) 2000 units TABS Take 2 capsules by mouth daily.    [provider]  fenofibrate (TRICOR) 145 MG tablet Take 145 mg by mouth daily.    [provider]  magnesium hydroxide (MILK OF MAGNESIA) 400 MG/5ML suspension Take 30 mLs by mouth every other day.    [provider]  memantine  (NAMENDA) 10 MG tablet Take 10 mg by mouth 2 (two) times daily.    [provider]  polyethylene glycol (MIRALAX / GLYCOLAX) packet Take 17 g by mouth daily with breakfast.     [provider]  simvastatin (ZOCOR) 20 MG tablet Take 20 mg by mouth at bedtime.    [provider]  Sunscreens (BLISTEX LIP BALM) 2-2.5-6.6 % STCK Apply 1 application topically 3 (three) times daily. Use's for chapped lips    [provider]  tizanidine (ZANAFLEX) 2 MG capsule Take 2 mg by mouth 2 (two) times daily.    [provider]  Triamcinolone Acetonide (TRIAMCINOLONE 0.1 % CREAM : EUCERIN) CREA Apply 1 application topically 2 (two) times daily.    [provider]  Vaginal Lubricant (REPLENS) GEL Place 1 applicator vaginally once a week. Every friday    [provider]  vitamin B-12 (CYANOCOBALAMIN) 1000 MCG tablet Take 1,000 mcg by mouth daily.    [provider]     Allergies    Galantamine and Tuberculin ppd   Review of Systems   Review of Systems Unable to assess due to mental status.    Physical Exam BP (!) 125/57   Pulse 86   Temp 99.2 F (37.3 C) (Oral)   Resp 18   SpO2 97%   Physical Exam Vitals and nursing  note reviewed.  Constitutional:      Appearance: Normal appearance.  HENT:     Head: Normocephalic and atraumatic.     Nose: Nose normal.     Mouth/Throat:     Mouth: Mucous membranes are dry.  Eyes:     Extraocular Movements: Extraocular movements intact.     Conjunctiva/sclera: Conjunctivae normal.  Cardiovascular:     Rate and Rhythm: Normal rate.  Pulmonary:     Effort: Pulmonary effort is normal. No respiratory distress.     Breath sounds: Rhonchi present.  Abdominal:     General: Abdomen is flat.     Palpations: Abdomen is soft.     Tenderness: There is no abdominal tenderness.  Musculoskeletal:        General: No swelling.     Cervical back: Neck supple.  Skin:    General: Skin is warm and dry.   Neurological:     Mental Status: She is alert.     Comments: Contracture of LUE  Psychiatric:     Comments: Unable to assess      ED Results / Procedures / Treatments   Labs (all labs ordered are listed, but only abnormal results are displayed) Labs Reviewed  SARS CORONAVIRUS 2 BY RT PCR (HOSPITAL ORDER, PERFORMED IN [facility]) - Abnormal; Notable for the following components:      Result Value   SARS Coronavirus 2 POSITIVE (*)    All other components within normal limits  COMPREHENSIVE METABOLIC PANEL - Abnormal; Notable for the following components:   Glucose, Bld 108 (*)    BUN 24 (*)    All other components within normal limits  URINALYSIS, ROUTINE W REFLEX MICROSCOPIC - Abnormal; Notable for the following components:   Protein, ur 30 (*)    Nitrite POSITIVE (*)    Bacteria, UA MANY (*)    All other components within normal limits  CULTURE, BLOOD (ROUTINE X 2)  CULTURE, BLOOD (ROUTINE X 2)  URINE CULTURE  CBC    EKG None  Radiology DG Chest 2 View  Result Date: 03/02/2020 CLINICAL DATA:  Rhonchi. EXAM: CHEST - 2 VIEW COMPARISON:  June 06, 2014 FINDINGS: There is an airspace opacity in the right middle lobe. There is no pneumothorax. No large pleural effusion. The heart size is normal. Aortic calcifications are noted. There are multiple lucencies overlying the patient's hepatic dome which are favored to represent interposed loops of bowel. There is osteopenia without evidence for an acute displaced compression fracture. IMPRESSION: Right middle lobe airspace opacity compatible with pneumonia. Follow-up to radiologic resolution is recommended. Electronically Signed   By: Katherine Mantle M.D.   On: 03/02/2020 18:42    Procedures Procedures  Medications Ordered in the ED Medications - No data to display   MDM Rules/Calculators/A&P MDM CXR is concerning for pneumonia. Will check labs, including Covid. No signs of sepsis with initial vitals. Will  await Covid before deciding on Abx.  ED Course  I have reviewed the triage vital signs and the nursing notes.  Pertinent labs & imaging results that were available during my care of the patient were reviewed by me and considered in my medical decision making (see chart for details).  Clinical Course as of Aug 10 0000  Mon Mar 02, 2020  2034 UA with positive nitrites and many bacteria but no WBC or LE. Equivocal for UTI, will send for culture.    [CS]  2216 CBC is normal   [CS]  2238  CMP is normal.    [CS]  2332 Patient's Covid is positive. RN will attempt to determine whether her LTCF is capable of caring for her and quarantining her.    [CS]  2345 Per RN, no answer at her facility. Will discuss admission with Hospitalist.    [CS]  2359 Spoke with Dr. Antionette Char, Hospitalist, who will evaluate the patient for admission.    [CS]    Clinical Course User Index [CS] Pollyann Savoy, MD    Final Clinical Impression(s) / ED Diagnoses Final diagnoses:  COVID-19    Rx / DC Orders ED Discharge Orders    None       Pollyann Savoy, MD 03/03/20 0000

## 2020-03-02 NOTE — ED Notes (Signed)
Pt's In and Out cath completed at 2003 for specimen collection.

## 2020-03-02 NOTE — ED Triage Notes (Signed)
Per EMS,  Pt is coming from [facility] [phone] [facility]). Called today due to generalized weakness, altered mental status. Yesterday found pt unresponsive but eventually woke her up thinking she was in a deep sleep. Pt also now has poor PO intake, and now incontinent. Rhonchi noted in both lower lung fields, and has a cough. Pt is vaccinated against covid.

## 2020-03-02 NOTE — ED Notes (Signed)
Urine culture sent with specimen 

## 2020-03-03 ENCOUNTER — Encounter (HOSPITAL_COMMUNITY): Payer: Self-pay | Admitting: Family Medicine

## 2020-03-03 DIAGNOSIS — N390 Urinary tract infection, site not specified: Secondary | ICD-10-CM | POA: Diagnosis present

## 2020-03-03 DIAGNOSIS — Z6820 Body mass index (BMI) 20.0-20.9, adult: Secondary | ICD-10-CM | POA: Diagnosis not present

## 2020-03-03 DIAGNOSIS — G9349 Other encephalopathy: Secondary | ICD-10-CM | POA: Diagnosis present

## 2020-03-03 DIAGNOSIS — G9340 Encephalopathy, unspecified: Secondary | ICD-10-CM | POA: Diagnosis present

## 2020-03-03 DIAGNOSIS — U071 COVID-19: Secondary | ICD-10-CM | POA: Diagnosis present

## 2020-03-03 DIAGNOSIS — G309 Alzheimer's disease, unspecified: Secondary | ICD-10-CM | POA: Diagnosis present

## 2020-03-03 DIAGNOSIS — H9190 Unspecified hearing loss, unspecified ear: Secondary | ICD-10-CM | POA: Diagnosis present

## 2020-03-03 DIAGNOSIS — F0280 Dementia in other diseases classified elsewhere without behavioral disturbance: Secondary | ICD-10-CM | POA: Diagnosis present

## 2020-03-03 DIAGNOSIS — R64 Cachexia: Secondary | ICD-10-CM | POA: Diagnosis present

## 2020-03-03 DIAGNOSIS — Z1612 Extended spectrum beta lactamase (ESBL) resistance: Secondary | ICD-10-CM | POA: Diagnosis present

## 2020-03-03 DIAGNOSIS — F71 Moderate intellectual disabilities: Secondary | ICD-10-CM | POA: Diagnosis present

## 2020-03-03 DIAGNOSIS — E7800 Pure hypercholesterolemia, unspecified: Secondary | ICD-10-CM | POA: Diagnosis present

## 2020-03-03 DIAGNOSIS — B9620 Unspecified Escherichia coli [E. coli] as the cause of diseases classified elsewhere: Secondary | ICD-10-CM | POA: Diagnosis present

## 2020-03-03 DIAGNOSIS — E119 Type 2 diabetes mellitus without complications: Secondary | ICD-10-CM | POA: Diagnosis present

## 2020-03-03 DIAGNOSIS — I69934 Monoplegia of upper limb following unspecified cerebrovascular disease affecting left non-dominant side: Secondary | ICD-10-CM | POA: Diagnosis not present

## 2020-03-03 DIAGNOSIS — J189 Pneumonia, unspecified organism: Secondary | ICD-10-CM | POA: Diagnosis present

## 2020-03-03 DIAGNOSIS — I10 Essential (primary) hypertension: Secondary | ICD-10-CM | POA: Diagnosis present

## 2020-03-03 DIAGNOSIS — Z993 Dependence on wheelchair: Secondary | ICD-10-CM | POA: Diagnosis not present

## 2020-03-03 DIAGNOSIS — Z794 Long term (current) use of insulin: Secondary | ICD-10-CM | POA: Diagnosis not present

## 2020-03-03 LAB — MAGNESIUM: Magnesium: 1.9 mg/dL (ref 1.7–2.4)

## 2020-03-03 LAB — HEPATITIS B SURFACE ANTIGEN: Hepatitis B Surface Ag: NONREACTIVE

## 2020-03-03 LAB — FIBRINOGEN: Fibrinogen: 514 mg/dL — ABNORMAL HIGH (ref 210–475)

## 2020-03-03 LAB — D-DIMER, QUANTITATIVE
D-Dimer, Quant: 0.75 ug/mL-FEU — ABNORMAL HIGH (ref 0.00–0.50)
D-Dimer, Quant: 0.8 ug/mL-FEU — ABNORMAL HIGH (ref 0.00–0.50)

## 2020-03-03 LAB — PROCALCITONIN: Procalcitonin: <0.1 ng/mL

## 2020-03-03 LAB — C-REACTIVE PROTEIN
CRP: 3.9 mg/dL — ABNORMAL HIGH (ref <1.0)
CRP: 3.8 mg/dL — ABNORMAL HIGH (ref <1.0)

## 2020-03-03 LAB — STREP PNEUMONIAE URINARY ANTIGEN: Strep Pneumo Urinary Antigen: NEGATIVE

## 2020-03-03 LAB — ABO/RH: ABO/RH(D): O POS

## 2020-03-03 LAB — LACTATE DEHYDROGENASE: LDH: 172 U/L (ref 98–192)

## 2020-03-03 MED ORDER — SODIUM CHLORIDE 0.9 % IV SOLN: INTRAVENOUS | Status: DC

## 2020-03-03 MED ORDER — ENOXAPARIN SODIUM 40 MG/0.4ML ~~LOC~~ SOLN
40.0000 mg | SUBCUTANEOUS | Status: DC
  Administered 2020-03-03 – 2020-03-09 (×7): 40 mg via SUBCUTANEOUS
  Filled 2020-03-03 (×7): qty 0.4

## 2020-03-03 MED ORDER — SODIUM CHLORIDE 0.9 % IV SOLN: INTRAVENOUS | Status: AC

## 2020-03-03 MED ORDER — SODIUM CHLORIDE 0.9 % IV SOLN
2.0000 g | Freq: Every day | INTRAVENOUS | Status: DC
  Administered 2020-03-03 – 2020-03-04 (×3): 2 g via INTRAVENOUS
  Filled 2020-03-03 (×2): qty 20
  Filled 2020-03-03: qty 2

## 2020-03-03 MED ORDER — SODIUM CHLORIDE 0.9 % IV SOLN
500.0000 mg | Freq: Every day | INTRAVENOUS | Status: DC
  Administered 2020-03-03 – 2020-03-04 (×3): 500 mg via INTRAVENOUS
  Filled 2020-03-03 (×3): qty 500

## 2020-03-03 NOTE — H&P (Signed)
History and Physical    Sherri Gross PJK:[id number] DOB: Mar 18, 1946 DOA: 03/02/2020  PCP: Lucretia Field   Patient coming from: [facility]   Chief Complaint: Lethargy   HPI: Sherri Gross is a 74 y.o. female with medical history significant for history of CVA with left arm weakness, dementia, intellectual disability, and reported history of psychosis, now presenting to the emergency department from her group home for evaluation of lethargy.  Patient was reportedly difficult to wake on 03/01/2020, eventually woke up but has been lethargic.  Patient is unable to contribute much of anything to the history and her contacts cannot be reached at the phone numbers listed.  ED Course: Upon arrival to the ED, patient is found to be afebrile, saturating upper 90s on room air, and with normal heart and respiratory rates and stable blood pressure.  Chemistry panel is notable for an elevated BUN to creatinine ratio, CBC is unremarkable, urinalysis is nitrite positive, and Covid PCR is positive.  Hospitalists were asked to admit.  Review of Systems:  All other systems reviewed and apart from HPI, are negative.  Past Medical History:  Diagnosis Date  . Arthritis   . Cataracts, bilateral   . Cerebellar ataxia (HCC)   . Dementia (HCC)   . Hearing loss   . Hypercholesteremia   . Hypertension   . IDDM (insulin dependent diabetes mellitus)   . Osteoarthritis   . Presbyopia   . Psychotic disorder [facility])     Past Surgical History:  Procedure Laterality Date  . denies surgical history      Social History:   has an unknown smoking status. She has never used smokeless tobacco. She reports that she does not drink alcohol and does not use drugs.  Allergies  Allergen Reactions  . Galantamine     Unknown per mar  . Tuberculin Ppd     unknown    History reviewed. No pertinent family history.   Prior to Admission medications   Medication Sig Start Date End Date Taking?  Authorizing Provider  AMITIZA 24 MCG capsule Take 24 mcg by mouth 2 (two) times daily. 09/13/19   [provider]  amLODipine (NORVASC) 2.5 MG tablet Take 2.5 mg by mouth at bedtime.     [provider]  benzocaine (ORAJEL) 10 % mucosal gel Use as directed 1 application in the mouth or throat 3 (three) times daily as needed for mouth pain.    [provider]  Cholecalciferol (VITAMIN D3) 2000 units TABS Take 2 capsules by mouth daily.    [provider]  fenofibrate (TRICOR) 145 MG tablet Take 145 mg by mouth daily.    [provider]  magnesium hydroxide (MILK OF MAGNESIA) 400 MG/5ML suspension Take 30 mLs by mouth every other day.    [provider]  memantine (NAMENDA) 10 MG tablet Take 10 mg by mouth 2 (two) times daily.    [provider]  polyethylene glycol (MIRALAX / GLYCOLAX) packet Take 17 g by mouth daily with breakfast.     [provider]  simvastatin (ZOCOR) 20 MG tablet Take 20 mg by mouth at bedtime.    [provider]  tizanidine (ZANAFLEX) 2 MG capsule Take 2 mg by mouth 2 (two) times daily.    [provider]  Vaginal Lubricant (REPLENS) GEL Place 1 applicator vaginally once a week. Every friday    [provider]  vitamin B-12 (CYANOCOBALAMIN) 1000 MCG tablet Take 1,000 mcg by mouth  daily.    [provider]    Physical Exam: Vitals:   03/02/20 1803 03/02/20 2200 03/02/20 2257 03/03/20 0020  BP:  (!) 161/74 (!) 125/57 (!) 132/59  Pulse:  85 86 84  Resp:  19 18 18   Temp:      TempSrc:      SpO2: 97% 98% 97% 94%     Constitutional: NAD, lethargic  Eyes: PERTLA, lids and conjunctivae normal ENMT: Mucous membranes are moist. Posterior pharynx clear of any exudate or lesions.   Neck: normal, supple, no masses, no thyromegaly Respiratory: no wheezing, no crackles. No accessory muscle use.  Cardiovascular: S1 & S2 heard, regular rate and rhythm. No extremity edema.     Abdomen: No distension, no tenderness, soft. Bowel sounds active.  Musculoskeletal: no clubbing / cyanosis. No joint deformity upper and lower extremities.   Skin: no significant rashes, lesions, ulcers. Warm, dry, well-perfused. Neurologic: no gross facial asymmetry. Sensation to light touch intact. Moving all extremities.  Psychiatric: Sleeping, wakes to voice or light touch. Makes eye-contact briefly before returning to sleep.    Labs and Imaging on Admission: I have personally reviewed following labs and imaging studies  CBC: Recent Labs  Lab 03/02/20 2156  WBC 7.6  HGB 12.9  HCT 40.5  MCV 93.5  PLT 162   Basic Metabolic Panel: Recent Labs  Lab 03/02/20 2156  NA 143  K 3.5  CL 108  CO2 24  GLUCOSE 108*  BUN 24*  CREATININE 0.58  CALCIUM 8.9   GFR: CrCl cannot be calculated (Unknown ideal weight.). Liver Function Tests: Recent Labs  Lab 03/02/20 2156  AST 23  ALT 16  ALKPHOS 46  BILITOT 0.5  PROT 7.3  ALBUMIN 4.1   No results for input(s): LIPASE, AMYLASE in the last 168 hours. No results for input(s): AMMONIA in the last 168 hours. Coagulation Profile: No results for input(s): INR, PROTIME in the last 168 hours. Cardiac Enzymes: No results for input(s): CKTOTAL, CKMB, CKMBINDEX, TROPONINI in the last 168 hours. BNP (last 3 results) No results for input(s): PROBNP in the last 8760 hours. HbA1C: No results for input(s): HGBA1C in the last 72 hours. CBG: No results for input(s): GLUCAP in the last 168 hours. Lipid Profile: No results for input(s): CHOL, HDL, LDLCALC, TRIG, CHOLHDL, LDLDIRECT in the last 72 hours. Thyroid Function Tests: No results for input(s): TSH, T4TOTAL, FREET4, T3FREE, THYROIDAB in the last 72 hours. Anemia Panel: No results for input(s): VITAMINB12, FOLATE, FERRITIN, TIBC, IRON, RETICCTPCT in the last 72 hours. Urine analysis:    Component Value Date/Time   COLORURINE YELLOW 03/02/2020 2004   APPEARANCEUR CLEAR 03/02/2020  2004   LABSPEC 1.021 03/02/2020 2004   PHURINE 6.0 03/02/2020 2004   GLUCOSEU NEGATIVE 03/02/2020 2004   HGBUR NEGATIVE 03/02/2020 2004   BILIRUBINUR NEGATIVE 03/02/2020 2004   KETONESUR NEGATIVE 03/02/2020 2004   PROTEINUR 30 (A) 03/02/2020 2004   UROBILINOGEN 1.0 01/17/2014 1616   NITRITE POSITIVE (A) 03/02/2020 2004   LEUKOCYTESUR NEGATIVE 03/02/2020 2004   Sepsis Labs: @LABRCNTIP (procalcitonin:4,lacticidven:4) ) Recent Results (from the past 240 hour(s))  SARS Coronavirus 2 by RT PCR (hospital order, performed in [facility] hospital lab) Nasopharyngeal Nasopharyngeal Swab     Status: Abnormal   Collection Time: 03/02/20  9:56 PM   Specimen: Nasopharyngeal Swab  Result Value Ref Range Status   SARS Coronavirus 2 POSITIVE (A) NEGATIVE Final    Comment: RESULT CALLED TO, READ BACK BY AND VERIFIED WITH: RN I HODGES  AT 2332 03/02/20 CRUICKSHANK A (NOTE) SARS-CoV-2 target nucleic acids are DETECTED  SARS-CoV-2 RNA is generally detectable in upper respiratory specimens  during the acute phase of infection.  Positive results are indicative  of the presence of the identified virus, but do not rule out bacterial infection or co-infection with other pathogens not detected by the test.  Clinical correlation with patient history and  other diagnostic information is necessary to determine patient infection status.  The expected result is negative.  Fact Sheet for Patients:   BoilerBrush.com.cy   Fact Sheet for Healthcare Providers:   https://pope.com/    This test is not yet approved or cleared by the [place] FDA and  has been authorized for detection and/or diagnosis of SARS-CoV-2 by FDA under an Emergency Use Authorization (EUA).  This EUA will remain in effect (meaning t his test can be used) for the duration of  the COVID-19 declaration under Section 564(b)(1) of the Act, 21 U.S.C. section 360-bbb-3(b)(1), unless the  authorization is terminated or revoked sooner.  Performed at [facility], [address] [place], [place] [address]      Radiological Exams on Admission: DG Chest 2 View  Result Date: 03/02/2020 CLINICAL DATA:  Rhonchi. EXAM: CHEST - 2 VIEW COMPARISON:  June 06, 2014 FINDINGS: There is an airspace opacity in the right middle lobe. There is no pneumothorax. No large pleural effusion. The heart size is normal. Aortic calcifications are noted. There are multiple lucencies overlying the patient's hepatic dome which are favored to represent interposed loops of bowel. There is osteopenia without evidence for an acute displaced compression fracture. IMPRESSION: Right middle lobe airspace opacity compatible with pneumonia. Follow-up to radiologic resolution is recommended. Electronically Signed   By: Katherine Mantle M.D.   On: 03/02/2020 18:42    Assessment/Plan   1. Acute encephalopathy  - Presents from group home with lethargy and is found to have positive COVID pcr  - She has chronic left arm weakness and cognitive impairment  - Likely secondary to COVID infection and/or dehydration  - Hydrate gently with IVF overnight, continue supportive care   2. COVID-19  - Presents with lethargy, is found to have positive COVID pcr and RML opacity on CXR without tachypnea or hypoxia  - Continue isolation, check inflammatory markers, start remdesivir if she develops respiratory symptoms and steroids if hypoxic    3. ?Pneumonia  - Presents with lethargy, is found to be positive for COVID, is not tachypneic or hypoxic, but has RML opacity on CXR  - Blood cultures were collected in ED  - Check procalcitonin and strep pneumo and legionella antigens, start Rocephin and azithromycin for now and consider discontinuing if procalcitonin low    4. Dementia; intellectual disability  - Baseline unclear, contacts could not be reached and message was left with caretaker  - Continue  supportive care, follow-up pharmacy medication-reconciliation   5. History of CVA  - Patient has residual left UE weakness  - Had been on statin previously, pharmacy medication-reconciliation pending     DVT prophylaxis: Lovenox   Code Status: Full for now, patient encephalopathic and listed contacts could not be reached  Family Communication: Discussed with patient  Disposition Plan:  Patient is from: [employer]  Anticipated d/c is to: TBD  Anticipated d/c date is: 03/04/20  Patient currently: Encephalopathic  Consults called: None  Admission status: Observation     Briscoe Deutscher, MD [facility]  03/03/2020, 1:07 AM

## 2020-03-03 NOTE — ED Notes (Signed)
Pt asleep and resting comfortably at this time.  

## 2020-03-03 NOTE — ED Notes (Addendum)
IVs do not pull back and Pt stuck x2 for labs.  Limited blood sent to lab.

## 2020-03-03 NOTE — ED Notes (Addendum)
Unable to arouse patient enough to ambulate at this time. Vital signs are stable. MD notified.

## 2020-03-03 NOTE — Progress Notes (Signed)
PROGRESS NOTE    Sherri PullingWilma L Gross  NGE:[id number] DOB: 10-18-1945 DOA: 03/02/2020 PCP: Sherri Gross, Sherri Gross   Brief Narrative:  Sherri PullingWilma L Gross is a 74 y.o. female with medical history significant for history of CVA with left arm weakness, dementia, intellectual disability, and reported history of psychosis, now presenting to the emergency department from her group home for evaluation of lethargy.  Patient was reportedly difficult to wake on 03/01/2020, eventually woke up but has been lethargic.  Patient is unable to contribute much of anything to the history and her contacts cannot be reached at the phone numbers listed. Upon arrival to the ED, patient is found to be afebrile, saturating upper 90s on room air, and with normal heart and respiratory rates and stable blood pressure.  Chemistry panel is notable for an elevated BUN to creatinine ratio, CBC is unremarkable, urinalysis is nitrite positive, and Covid PCR is positive. Patient received dose #1 on 08/02/2019 and dose #2 on 08/30/2019 for COVID-19 vaccine. Hospitalists were asked to admit.  Assessment & Plan:   Principal Problem:   Acute encephalopathy Active Problems:   Dementia (HCC)   Intellectual disability   COVID-19   Pneumonia   Acute encephalopathy, unclear etiology, POA Rule out infectious process, rule out polypharmacy  - Patient presents from group home with lethargy - Covid PCR positive, patient previously vaccinated on 08/02/2019 and 08/30/2019 - Unclear if in the setting of polypharmacy, dehydration or acute symptomatic COVID-19 - She has chronic left arm weakness and cognitive impairment  - Baseline per Sherri Gross/Sherri Gross at group home. She can feed herself - sometimes will help bathe herself; talks occasionally but not typically to strangers; can hold up fingers #2 vs #1 for bathroom - Continue IVF while poorly taking PO   Community acquired pneumonia versus acute COVID-19, POA - Presents with lethargy, is found to have positive COVID  pcr and RML opacity on CXR without tachypnea or hypoxia  -Right middle lobe infiltrate, continue azithromycin, ceftriaxone - Continue isolation - patient remains without hypoxia, overt pulmonary symptoms, inflammatory markers flat; unlikely acute symptomatic Covid infection SpO2: 96 %   Recent Labs    03/03/20 0147 03/03/20 0531 03/03/20 0811  DDIMER 0.75*  --  0.80*  LDH 172  --   --   CRP 3.9* 3.8*  --     Lab Results  Component Value Date   SARSCOV2NAA POSITIVE (A) 03/02/2020   Dementia; intellectual disability  - Baseline as above, able to feed herself, occasionally speaks, can assist with bathing  - Continue supportive care  History of CVA  - Patient has residual left UE weakness  - Had been on statin previously, pharmacy medication-reconciliation pending    DVT prophylaxis: Lovenox   Code Status: Full Family Communication: Spoke with group home  Status is: Inpatient  Dispo: The patient is from: Group home              Anticipated d/c is to: To be determined              Anticipated d/c date is: 48 to 72 hours pending clinical course              Patient currently not medically stable for discharge given ongoing mental status changes, lethargy, poor p.o. intake requiring IV fluids and possibly acute infectious process as yet to be ruled out  Consultants:   None  Procedures:   None  Antimicrobials:  Azithromycin, ceftriaxone  Subjective: No acute issues or events overnight, this morning  patient remains nonverbal, noninteractive, will open her eyes to stimulus and track but otherwise would not follow commands, appears not to be far from baseline.  Review of systems otherwise severely limited.  Objective: Vitals:   03/03/20 0814 03/03/20 1104 03/03/20 1136 03/03/20 1438  BP: (!) 126/59 (!) 126/59 (!) 141/62 134/61  Pulse: 73 73 75 65  Resp: 15 15 17 19   Temp: 99.2 F (37.3 C) 99.2 F (37.3 C)  99.2 F (37.3 C)  TempSrc: Oral Oral  Oral  SpO2: 98%  98% 99% 96%    Intake/Output Summary (Last 24 hours) at 03/03/2020 1501 Last data filed at 03/03/2020 1134 Gross per 24 hour  Intake 350.86 ml  Output 600 ml  Net -249.14 ml   There were no vitals filed for this visit.  Examination:  General: Hectic appearing elderly female resting comfortably in bed, No acute distress. HEENT:  Normocephalic atraumatic.  Sclerae nonicteric, noninjected.  Extraocular movements intact bilaterally. Neck:  Without mass or deformity.  Trachea is midline. Lungs: Right basilar rhonchi without overt wheeze or rales Heart:  Regular rate and rhythm.  Without murmurs, rubs, or gallops. Abdomen:  Soft, nontender, nondistended.  Without guarding or rebound. Extremities: Without cyanosis, clubbing, edema, or obvious deformity. Vascular:  Dorsalis pedis and posterior tibial pulses palpable bilaterally. Skin:  Warm and dry, no erythema, no ulcerations.  Data Reviewed: I have personally reviewed following labs and imaging studies  CBC: Recent Labs  Lab 03/02/20 2156  WBC 7.6  HGB 12.9  HCT 40.5  MCV 93.5  PLT 162   Basic Metabolic Panel: Recent Labs  Lab 03/02/20 2156 03/03/20 0531  NA 143  --   K 3.5  --   CL 108  --   CO2 24  --   GLUCOSE 108*  --   BUN 24*  --   CREATININE 0.58  --   CALCIUM 8.9  --   MG  --  1.9   GFR: CrCl cannot be calculated (Unknown ideal weight.). Liver Function Tests: Recent Labs  Lab 03/02/20 2156  AST 23  ALT 16  ALKPHOS 46  BILITOT 0.5  PROT 7.3  ALBUMIN 4.1   No results for input(s): LIPASE, AMYLASE in the last 168 hours. No results for input(s): AMMONIA in the last 168 hours. Coagulation Profile: No results for input(s): INR, PROTIME in the last 168 hours. Cardiac Enzymes: No results for input(s): CKTOTAL, CKMB, CKMBINDEX, TROPONINI in the last 168 hours. BNP (last 3 results) No results for input(s): PROBNP in the last 8760 hours. HbA1C: No results for input(s): HGBA1C in the last 72  hours. CBG: No results for input(s): GLUCAP in the last 168 hours. Lipid Profile: No results for input(s): CHOL, HDL, LDLCALC, TRIG, CHOLHDL, LDLDIRECT in the last 72 hours. Thyroid Function Tests: No results for input(s): TSH, T4TOTAL, FREET4, T3FREE, THYROIDAB in the last 72 hours. Anemia Panel: No results for input(s): VITAMINB12, FOLATE, FERRITIN, TIBC, IRON, RETICCTPCT in the last 72 hours. Sepsis Labs: Recent Labs  Lab 03/03/20 0147  PROCALCITON <0.10    Recent Results (from the past 240 hour(s))  SARS Coronavirus 2 by RT PCR (hospital order, performed in [facility] hospital lab) Nasopharyngeal Nasopharyngeal Swab     Status: Abnormal   Collection Time: 03/02/20  9:56 PM   Specimen: Nasopharyngeal Swab  Result Value Ref Range Status   SARS Coronavirus 2 POSITIVE (A) NEGATIVE Final    Comment: RESULT CALLED TO, READ BACK BY AND VERIFIED WITH: RN I HODGES  AT 2332 03/02/20 CRUICKSHANK A (NOTE) SARS-CoV-2 target nucleic acids are DETECTED  SARS-CoV-2 RNA is generally detectable in upper respiratory specimens  during the acute phase of infection.  Positive results are indicative  of the presence of the identified virus, but do not rule out bacterial infection or co-infection with other pathogens not detected by the test.  Clinical correlation with patient history and  other diagnostic information is necessary to determine patient infection status.  The expected result is negative.  Fact Sheet for Patients:   BoilerBrush.com.cy   Fact Sheet for Healthcare Providers:   https://pope.com/    This test is not yet approved or cleared by the [place] FDA and  has been authorized for detection and/or diagnosis of SARS-CoV-2 by FDA under an Emergency Use Authorization (EUA).  This EUA will remain in effect (meaning t his test can be used) for the duration of  the COVID-19 declaration under Section 564(b)(1) of the Act, 21 U.S.C.  section 360-bbb-3(b)(1), unless the authorization is terminated or revoked sooner.  Performed at [facility], [address] [place], [place] [address]   Culture, blood (routine x 2)     Status: None (Preliminary result)   Collection Time: 03/02/20  9:57 PM   Specimen: BLOOD  Result Value Ref Range Status   Specimen Description   Final    BLOOD BLOOD RIGHT FOREARM Performed at [facility], [address] [place], [place] [address]    Special Requests   Final    BOTTLES DRAWN AEROBIC AND ANAEROBIC Blood Culture results may not be optimal due to an inadequate volume of blood received in culture bottles Performed at [facility], [address], [place], [place] [address]    Culture   Final    NO GROWTH < 12 HOURS Performed at [facility], [address], [place], [place] [address]    Report Status PENDING  Incomplete  Culture, blood (routine x 2)     Status: None (Preliminary result)   Collection Time: 03/02/20  9:59 PM   Specimen: BLOOD  Result Value Ref Range Status   Specimen Description   Final    BLOOD BLOOD LEFT FOREARM Performed at [facility], [address], [place], [place] [address]    Special Requests   Final    BOTTLES DRAWN AEROBIC AND ANAEROBIC Blood Culture adequate volume Performed at [facility], [address] [place], [place] [address]    Culture   Final    NO GROWTH < 12 HOURS Performed at [facility], [address], [place], [place] [address]    Report Status PENDING  Incomplete         Radiology Studies: DG Chest 2 View  Result Date: 03/02/2020 CLINICAL DATA:  Rhonchi. EXAM: CHEST - 2 VIEW COMPARISON:  June 06, 2014 FINDINGS: There is an airspace opacity in the right middle lobe. There is no pneumothorax. No large pleural effusion. The heart size is normal. Aortic calcifications are noted. There are multiple lucencies overlying  the patient's hepatic dome which are favored to represent interposed loops of bowel. There is osteopenia without evidence for an acute displaced compression fracture. IMPRESSION: Right middle lobe airspace opacity compatible with pneumonia. Follow-up to radiologic resolution is recommended. Electronically Signed   By: Katherine Mantle Gross.D.   On: 03/02/2020 18:42   Scheduled Meds: . enoxaparin (LOVENOX) injection  40 mg Subcutaneous Q24H   Continuous Infusions: . sodium  chloride 75 mL/hr at 03/03/20 1134  . azithromycin Stopped (03/03/20 0258)  . cefTRIAXone (ROCEPHIN)  IV Stopped (03/03/20 0220)     LOS: 0 days   Time spent:  Azucena Fallen, DO [facility]  If 7PM-7AM, please contact night-coverage [email or web address]  03/03/2020, 3:01 PM

## 2020-03-03 NOTE — Progress Notes (Signed)
Call to [facility] Department and obtained dates of vaccination. Per Angelique Blonder at [place], patient received dose #1 on 08/02/2019 and dose #2 on 08/30/2019. Patient received Moderna for both doses.

## 2020-03-03 NOTE — ED Notes (Signed)
Report called for pt transfer to the floor. Spoke with receiving nurse Samara Deist

## 2020-03-04 ENCOUNTER — Encounter (HOSPITAL_COMMUNITY): Payer: Self-pay | Admitting: Internal Medicine

## 2020-03-04 DIAGNOSIS — G9340 Encephalopathy, unspecified: Secondary | ICD-10-CM | POA: Diagnosis not present

## 2020-03-04 LAB — URINE CULTURE: Culture: >=100000 — AB

## 2020-03-04 LAB — COMPREHENSIVE METABOLIC PANEL WITH GFR
ALT: 17 U/L (ref 0–44)
AST: 25 U/L (ref 15–41)
Albumin: 3.2 g/dL — ABNORMAL LOW (ref 3.5–5.0)
Alkaline Phosphatase: 38 U/L (ref 38–126)
Anion gap: 14 (ref 5–15)
BUN: 15 mg/dL (ref 8–23)
CO2: 22 mmol/L (ref 22–32)
Calcium: 8.4 mg/dL — ABNORMAL LOW (ref 8.9–10.3)
Chloride: 105 mmol/L (ref 98–111)
Creatinine, Ser: 0.53 mg/dL (ref 0.44–1.00)
GFR calc Af Amer: >60 mL/min
GFR calc non Af Amer: >60 mL/min
Glucose, Bld: 78 mg/dL (ref 70–99)
Potassium: 3.1 mmol/L — ABNORMAL LOW (ref 3.5–5.1)
Sodium: 141 mmol/L (ref 135–145)
Total Bilirubin: 0.8 mg/dL (ref 0.3–1.2)
Total Protein: 6 g/dL — ABNORMAL LOW (ref 6.5–8.1)

## 2020-03-04 LAB — D-DIMER, QUANTITATIVE: D-Dimer, Quant: 0.57 ug/mL-FEU — ABNORMAL HIGH (ref 0.00–0.50)

## 2020-03-04 LAB — CBC WITH DIFFERENTIAL/PLATELET
Abs Immature Granulocytes: 0.01 10*3/uL (ref 0.00–0.07)
Basophils Absolute: 0 10*3/uL (ref 0.0–0.1)
Basophils Relative: 0 %
Eosinophils Absolute: 0 10*3/uL (ref 0.0–0.5)
Eosinophils Relative: 0 %
HCT: 38.7 % (ref 36.0–46.0)
Hemoglobin: 12.2 g/dL (ref 12.0–15.0)
Immature Granulocytes: 0 %
Lymphocytes Relative: 43 %
Lymphs Abs: 2 10*3/uL (ref 0.7–4.0)
MCH: 29.9 pg (ref 26.0–34.0)
MCHC: 31.5 g/dL (ref 30.0–36.0)
MCV: 94.9 fL (ref 80.0–100.0)
Monocytes Absolute: 0.6 10*3/uL (ref 0.1–1.0)
Monocytes Relative: 12 %
Neutro Abs: 2 10*3/uL (ref 1.7–7.7)
Neutrophils Relative %: 45 %
Platelets: 153 10*3/uL (ref 150–400)
RBC: 4.08 MIL/uL (ref 3.87–5.11)
RDW: 13.2 % (ref 11.5–15.5)
WBC: 4.7 10*3/uL (ref 4.0–10.5)
nRBC: 0 % (ref 0.0–0.2)

## 2020-03-04 LAB — C-REACTIVE PROTEIN: CRP: 2.3 mg/dL — ABNORMAL HIGH (ref <1.0)

## 2020-03-04 LAB — LEGIONELLA PNEUMOPHILA SEROGP 1 UR AG: L. pneumophila Serogp 1 Ur Ag: NEGATIVE

## 2020-03-04 MED ORDER — ADULT MULTIVITAMIN W/MINERALS CH
1.0000 | ORAL_TABLET | Freq: Every day | ORAL | Status: DC
  Administered 2020-03-04 – 2020-03-09 (×6): 1 via ORAL
  Filled 2020-03-04 (×6): qty 1

## 2020-03-04 MED ORDER — ENSURE ENLIVE PO LIQD
237.0000 mL | Freq: Two times a day (BID) | ORAL | Status: DC
  Administered 2020-03-05 – 2020-03-09 (×8): 237 mL via ORAL

## 2020-03-04 MED ORDER — POTASSIUM CHLORIDE CRYS ER 20 MEQ PO TBCR
20.0000 meq | EXTENDED_RELEASE_TABLET | Freq: Two times a day (BID) | ORAL | Status: DC
  Administered 2020-03-04 – 2020-03-09 (×11): 20 meq via ORAL
  Filled 2020-03-04 (×11): qty 1

## 2020-03-04 NOTE — Progress Notes (Signed)
Initial Nutrition Assessment  INTERVENTION:   -Ensure Enlive po BID, each supplement provides 350 kcal and 20 grams of protein -Multivitamin with minerals daily -Magic cup BID with meals, each supplement provides 290 kcal and 9 grams of protein   NUTRITION DIAGNOSIS:   Increased nutrient needs related to acute illness (COVID-19 pneumonia) as evidenced by estimated needs.  GOAL:   Patient will meet greater than or equal to 90% of their needs  MONITOR:   PO intake, Supplement acceptance, Labs, Weight trends, I & O's  REASON FOR ASSESSMENT:   Malnutrition Screening Tool    ASSESSMENT:   74 y.o. female with medical history significant for history of CVA with left arm weakness, dementia, intellectual disability, and reported history of psychosis, now presenting to the emergency department from her group home for evaluation of lethargy.  Patient was reportedly difficult to wake on 03/01/2020, eventually woke up but has been lethargic.  8/9: COVID+  Patient with dementia, disoriented x 4. Not able to gather nutrition history from patient.  Pt with history of CVA. Per facility, pt is able to feed herself. Has had poor PO recently.  Pt consumed 10% of breakfast this morning.   Will order nutritional supplements given increased needs from pneumonia from COVID-19.  Per weight records, pt weighed 153 lbs in 2018. Current weight: 123 lbs.   Medications: KLOR-CON Labs reviewed: Low K  NUTRITION - FOCUSED PHYSICAL EXAM:  Unable to complete  Diet Order:   Diet Order            Diet Heart Room service appropriate? Yes; Fluid consistency: Thin  Diet effective now                 EDUCATION NEEDS:   No education needs have been identified at this time  Skin:  Skin Assessment: Reviewed RN Assessment  Last BM:  PTA  Height:   Ht Readings from Last 1 Encounters:  03/04/20 5\' 5"  (1.651 m)    Weight:   Wt Readings from Last 1 Encounters:  03/03/20 56 kg   BMI:  Body  mass index is 20.54 kg/m.  Estimated Nutritional Needs:   Kcal:  1600-1800  Protein:  70-85g  Fluid:  1.8L/day   05/03/20, MS, RD, LDN Inpatient Clinical Dietitian Contact information available via Amion

## 2020-03-04 NOTE — Plan of Care (Signed)

## 2020-03-04 NOTE — Progress Notes (Signed)
PROGRESS NOTE    Sherri Gross  HEN:[id number] DOB: 02-14-1946 DOA: 03/02/2020 PCP: Lucretia Field   Brief Narrative:  Sherri Gross is a 74 y.o. female with medical history significant for history of CVA with left arm weakness, dementia, intellectual disability, and reported history of psychosis, now presenting to the emergency department from her group home for evaluation of lethargy.  Patient was reportedly difficult to wake on 03/01/2020, eventually woke up but has been lethargic.  Patient is unable to contribute much of anything to the history and her contacts cannot be reached at the phone numbers listed. Upon arrival to the ED, patient is found to be afebrile, saturating upper 90s on room air, and with normal heart and respiratory rates and stable blood pressure.  Chemistry panel is notable for an elevated BUN to creatinine ratio, CBC is unremarkable, urinalysis is nitrite positive, and Covid PCR is positive. Patient received dose #1 on 08/02/2019 and dose #2 on 08/30/2019 for COVID-19 vaccine. Hospitalists were asked to admit.  Assessment & Plan:   Principal Problem:   Acute encephalopathy Active Problems:   Dementia (HCC)   Intellectual disability   COVID-19   Pneumonia  Acute encephalopathy, unclear etiology, POA Rule out infectious process, rule out polypharmacy  - Patient presents from group home with lethargy - Covid PCR positive, patient previously vaccinated on 08/02/2019 and 08/30/2019 - Unclear if in the setting of polypharmacy, dehydration or acute symptomatic COVID-19 - She has chronic left arm weakness and cognitive impairment  - Baseline per Tamika/Linda at group home. She can feed herself - sometimes will help bathe herself; talks occasionally but not typically to strangers; can hold up fingers #2 vs #1 for bathroom - Continue IVF while poorly taking PO   Community acquired pneumonia versus acute COVID-19, POA - Presents with lethargy, is found to have positive COVID  pcr and RML opacity on CXR without tachypnea or hypoxia  - Right middle lobe infiltrate, continue azithromycin, ceftriaxone - Continue isolation - patient remains without hypoxia, overt pulmonary symptoms, inflammatory markers flat; unlikely acute symptomatic Covid infection SpO2: 96 % Recent Labs    03/03/20 0147 03/03/20 0531 03/03/20 0811 03/04/20 0341  DDIMER 0.75*  --  0.80* 0.57*  LDH 172  --   --   --   CRP 3.9* 3.8*  --  2.3*   Lab Results  Component Value Date   SARSCOV2NAA POSITIVE (A) 03/02/2020   Dementia; intellectual disability  - Baseline as above, able to feed herself, occasionally speaks, can assist with bathing  - Continue supportive care  History of CVA  - Patient has residual left UE weakness  - Had been on statin previously, pharmacy medication-reconciliation pending    DVT prophylaxis: Lovenox   Code Status: Full Family Communication: Spoke with group home  Status is: Inpatient  Dispo: The patient is from: Group home              Anticipated d/c is to: To be determined              Anticipated d/c date is: 48 to 72 hours pending clinical course              Patient currently not medically stable for discharge given ongoing mental status changes, lethargy, poor p.o. intake requiring IV fluids and possibly acute infectious process as yet to be ruled out  Consultants:   None  Procedures:   None  Antimicrobials:  Azithromycin, ceftriaxone  Subjective: No acute issues or  events overnight, this morning patient remains nonverbal, poorly interactive, will open her eyes to stimulus and track but otherwise would not follow commands, appears not to be far from baseline.  Review of systems otherwise severely limited.  Objective: Vitals:   03/03/20 2345 03/03/20 2350 03/04/20 0441 03/04/20 0728  BP: (!) 146/74  (!) 148/69   Pulse: 76  72   Resp: 19  18   Temp: 99.1 F (37.3 C)  98.3 F (36.8 C)   TempSrc: Oral Oral Oral   SpO2: 93%  96%     Weight:  56 kg    Height:    5\' 5"  (1.651 m)    Intake/Output Summary (Last 24 hours) at 03/04/2020 0736 Last data filed at 03/03/2020 1134 Gross per 24 hour  Intake --  Output 600 ml  Net -600 ml   Filed Weights   03/03/20 2350  Weight: 56 kg    Examination:  General: cachetic appearing elderly female resting comfortably in bed, No acute distress. HEENT:  Normocephalic atraumatic.  Sclerae nonicteric, noninjected.  Extraocular movements intact bilaterally. Neck:  Without mass or deformity.  Trachea is midline. Lungs: Right basilar rhonchi without overt wheeze or rales Heart:  Regular rate and rhythm.  Without murmurs, rubs, or gallops. Abdomen:  Soft, nontender, nondistended.  Without guarding or rebound. Extremities: Without cyanosis, clubbing, edema, or obvious deformity. Vascular:  Dorsalis pedis and posterior tibial pulses palpable bilaterally. Skin:  Warm and dry, no erythema, no ulcerations.  Data Reviewed: I have personally reviewed following labs and imaging studies  CBC: Recent Labs  Lab 03/02/20 2156 03/04/20 0341  WBC 7.6 4.7  NEUTROABS  --  2.0  HGB 12.9 12.2  HCT 40.5 38.7  MCV 93.5 94.9  PLT 162 153   Basic Metabolic Panel: Recent Labs  Lab 03/02/20 2156 03/03/20 0531 03/04/20 0341  NA 143  --  141  K 3.5  --  3.1*  CL 108  --  105  CO2 24  --  22  GLUCOSE 108*  --  78  BUN 24*  --  15  CREATININE 0.58  --  0.53  CALCIUM 8.9  --  8.4*  MG  --  1.9  --    GFR: Estimated Creatinine Clearance: 54.5 mL/min (by C-G formula based on SCr of 0.53 mg/dL). Liver Function Tests: Recent Labs  Lab 03/02/20 2156 03/04/20 0341  AST 23 25  ALT 16 17  ALKPHOS 46 38  BILITOT 0.5 0.8  PROT 7.3 6.0*  ALBUMIN 4.1 3.2*   No results for input(s): LIPASE, AMYLASE in the last 168 hours. No results for input(s): AMMONIA in the last 168 hours. Coagulation Profile: No results for input(s): INR, PROTIME in the last 168 hours. Cardiac Enzymes: No results  for input(s): CKTOTAL, CKMB, CKMBINDEX, TROPONINI in the last 168 hours. BNP (last 3 results) No results for input(s): PROBNP in the last 8760 hours. HbA1C: No results for input(s): HGBA1C in the last 72 hours. CBG: No results for input(s): GLUCAP in the last 168 hours. Lipid Profile: No results for input(s): CHOL, HDL, LDLCALC, TRIG, CHOLHDL, LDLDIRECT in the last 72 hours. Thyroid Function Tests: No results for input(s): TSH, T4TOTAL, FREET4, T3FREE, THYROIDAB in the last 72 hours. Anemia Panel: No results for input(s): VITAMINB12, FOLATE, FERRITIN, TIBC, IRON, RETICCTPCT in the last 72 hours. Sepsis Labs: Recent Labs  Lab 03/03/20 0147  PROCALCITON <0.10    Recent Results (from the past 240 hour(s))  Urine culture  Status: Abnormal (Preliminary result)   Collection Time: 03/02/20  8:04 PM   Specimen: Urine, Clean Catch  Result Value Ref Range Status   Specimen Description   Final    URINE, CLEAN CATCH Performed at [facility], [address] [place], [place] [address]    Special Requests   Final    NONE Performed at [facility], [address] [place], [place] [address]    Culture (A)  Final    >=100,000 COLONIES/mL GRAM NEGATIVE RODS IDENTIFICATION AND SUSCEPTIBILITIES TO FOLLOW Performed at [facility], [address] [place], [place] [address]    Report Status PENDING  Incomplete  SARS Coronavirus 2 by RT PCR (hospital order, performed in [facility] hospital lab) Nasopharyngeal Nasopharyngeal Swab     Status: Abnormal   Collection Time: 03/02/20  9:56 PM   Specimen: Nasopharyngeal Swab  Result Value Ref Range Status   SARS Coronavirus 2 POSITIVE (A) NEGATIVE Final    Comment: RESULT CALLED TO, READ BACK BY AND VERIFIED WITH: RN I HODGES AT 2332 03/02/20 CRUICKSHANK A (NOTE) SARS-CoV-2 target nucleic acids are DETECTED  SARS-CoV-2 RNA is generally detectable in upper respiratory specimens  during the acute  phase of infection.  Positive results are indicative  of the presence of the identified virus, but do not rule out bacterial infection or co-infection with other pathogens not detected by the test.  Clinical correlation with patient history and  other diagnostic information is necessary to determine patient infection status.  The expected result is negative.  Fact Sheet for Patients:   BoilerBrush.com.cyhttps://www.fda.gov/media/136312/download   Fact Sheet for Healthcare Providers:   https://pope.com/https://www.fda.gov/media/136313/download    This test is not yet approved or cleared by the [place] FDA and  has been authorized for detection and/or diagnosis of SARS-CoV-2 by FDA under an Emergency Use Authorization (EUA).  This EUA will remain in effect (meaning t his test can be used) for the duration of  the COVID-19 declaration under Section 564(b)(1) of the Act, 21 U.S.C. section 360-bbb-3(b)(1), unless the authorization is terminated or revoked sooner.  Performed at [facility], [address] [place], [place] [address]   Culture, blood (routine x 2)     Status: None (Preliminary result)   Collection Time: 03/02/20  9:57 PM   Specimen: BLOOD  Result Value Ref Range Status   Specimen Description   Final    BLOOD BLOOD RIGHT FOREARM Performed at [facility], [address] [place], [place] [address]    Special Requests   Final    BOTTLES DRAWN AEROBIC AND ANAEROBIC Blood Culture results may not be optimal due to an inadequate volume of blood received in culture bottles Performed at [facility], [address] [place], [place] [address]    Culture   Final    NO GROWTH < 12 HOURS Performed at [facility], [address] [place], [place] [address]    Report Status PENDING  Incomplete  Culture, blood (routine x 2)     Status: None (Preliminary result)   Collection Time: 03/02/20  9:59 PM   Specimen: BLOOD  Result Value Ref Range  Status   Specimen Description   Final    BLOOD BLOOD LEFT FOREARM Performed at [facility], [address] [place], [place] [address]    Special Requests   Final    BOTTLES DRAWN AEROBIC AND ANAEROBIC Blood Culture adequate volume Performed at  [facility], [address], [place], [place] [address]    Culture   Final    NO GROWTH < 12 HOURS Performed at [facility], [address] [place], [place] [address]    Report Status PENDING  Incomplete         Radiology Studies: DG Chest 2 View  Result Date: 03/02/2020 CLINICAL DATA:  Rhonchi. EXAM: CHEST - 2 VIEW COMPARISON:  June 06, 2014 FINDINGS: There is an airspace opacity in the right middle lobe. There is no pneumothorax. No large pleural effusion. The heart size is normal. Aortic calcifications are noted. There are multiple lucencies overlying the patient's hepatic dome which are favored to represent interposed loops of bowel. There is osteopenia without evidence for an acute displaced compression fracture. IMPRESSION: Right middle lobe airspace opacity compatible with pneumonia. Follow-up to radiologic resolution is recommended. Electronically Signed   By: Katherine Mantle M.D.   On: 03/02/2020 18:42   Scheduled Meds: . enoxaparin (LOVENOX) injection  40 mg Subcutaneous Q24H   Continuous Infusions: . sodium chloride 75 mL/hr at 03/03/20 2357  . azithromycin 500 mg (03/03/20 2359)  . cefTRIAXone (ROCEPHIN)  IV 2 g (03/03/20 2223)     LOS: 1 day   Time spent:  Azucena Fallen, DO [facility]  If 7PM-7AM, please contact night-coverage [email or web address]  03/04/2020, 7:36 AM

## 2020-03-04 NOTE — Plan of Care (Signed)
  Problem: Education: Goal: Knowledge of General Education information will improve Description: Including pain rating scale, medication(s)/side effects and non-pharmacologic comfort measures 03/04/2020 2043 by Rick Duff, RN Outcome: Progressing 03/04/2020 2042 by Rick Duff, RN Outcome: Progressing   Problem: Health Behavior/Discharge Planning: Goal: Ability to manage health-related needs will improve 03/04/2020 2043 by Rick Duff, RN Outcome: Progressing 03/04/2020 2042 by Rick Duff, RN Outcome: Progressing   Problem: Clinical Measurements: Goal: Ability to maintain clinical measurements within normal limits will improve 03/04/2020 2043 by Rick Duff, RN Outcome: Progressing 03/04/2020 2042 by Rick Duff, RN Outcome: Progressing Goal: Will remain free from infection 03/04/2020 2043 by Rick Duff, RN Outcome: Progressing 03/04/2020 2042 by Rick Duff, RN Outcome: Progressing Goal: Diagnostic test results will improve 03/04/2020 2043 by Rick Duff, RN Outcome: Progressing 03/04/2020 2042 by Rick Duff, RN Outcome: Progressing Goal: Respiratory complications will improve 03/04/2020 2043 by Rick Duff, RN Outcome: Progressing 03/04/2020 2042 by Rick Duff, RN Outcome: Progressing Goal: Cardiovascular complication will be avoided 03/04/2020 2043 by Rick Duff, RN Outcome: Progressing 03/04/2020 2042 by Rick Duff, RN Outcome: Progressing   Problem: Activity: Goal: Risk for activity intolerance will decrease 03/04/2020 2043 by Rick Duff, RN Outcome: Progressing 03/04/2020 2042 by Rick Duff, RN Outcome: Progressing   Problem: Nutrition: Goal: Adequate nutrition will be maintained 03/04/2020 2043 by Rick Duff, RN Outcome: Progressing 03/04/2020 2042 by Rick Duff, RN Outcome: Progressing   Problem: Coping: Goal: Level of anxiety will decrease 03/04/2020 2043 by Rick Duff, RN Outcome:  Progressing 03/04/2020 2042 by Rick Duff, RN Outcome: Progressing   Problem: Elimination: Goal: Will not experience complications related to bowel motility 03/04/2020 2043 by Rick Duff, RN Outcome: Progressing 03/04/2020 2042 by Rick Duff, RN Outcome: Progressing Goal: Will not experience complications related to urinary retention 03/04/2020 2043 by Rick Duff, RN Outcome: Progressing 03/04/2020 2042 by Rick Duff, RN Outcome: Progressing   Problem: Pain Managment: Goal: General experience of comfort will improve 03/04/2020 2043 by Rick Duff, RN Outcome: Progressing 03/04/2020 2042 by Rick Duff, RN Outcome: Progressing   Problem: Safety: Goal: Ability to remain free from injury will improve 03/04/2020 2043 by Rick Duff, RN Outcome: Progressing 03/04/2020 2042 by Rick Duff, RN Outcome: Progressing   Problem: Skin Integrity: Goal: Risk for impaired skin integrity will decrease 03/04/2020 2043 by Rick Duff, RN Outcome: Progressing 03/04/2020 2042 by Rick Duff, RN Outcome: Progressing   Problem: Education: Goal: Knowledge of risk factors and measures for prevention of condition will improve Outcome: Progressing   Problem: Coping: Goal: Psychosocial and spiritual needs will be supported Outcome: Progressing   Problem: Respiratory: Goal: Will maintain a patent airway Outcome: Progressing Goal: Complications related to the disease process, condition or treatment will be avoided or minimized Outcome: Progressing

## 2020-03-05 DIAGNOSIS — G9340 Encephalopathy, unspecified: Secondary | ICD-10-CM | POA: Diagnosis not present

## 2020-03-05 LAB — COMPREHENSIVE METABOLIC PANEL
ALT: 14 U/L (ref 0–44)
AST: 24 U/L (ref 15–41)
Albumin: 3 g/dL — ABNORMAL LOW (ref 3.5–5.0)
Alkaline Phosphatase: 39 U/L (ref 38–126)
Anion gap: 8 (ref 5–15)
BUN: 12 mg/dL (ref 8–23)
CO2: 25 mmol/L (ref 22–32)
Calcium: 8.2 mg/dL — ABNORMAL LOW (ref 8.9–10.3)
Chloride: 105 mmol/L (ref 98–111)
Creatinine, Ser: 0.51 mg/dL (ref 0.44–1.00)
GFR calc Af Amer: >60 mL/min (ref >60)
GFR calc non Af Amer: >60 mL/min (ref >60)
Glucose, Bld: 98 mg/dL (ref 70–99)
Potassium: 3.2 mmol/L — ABNORMAL LOW (ref 3.5–5.1)
Sodium: 138 mmol/L (ref 135–145)
Total Bilirubin: 0.6 mg/dL (ref 0.3–1.2)
Total Protein: 5.3 g/dL — ABNORMAL LOW (ref 6.5–8.1)

## 2020-03-05 LAB — CBC WITH DIFFERENTIAL/PLATELET
Abs Immature Granulocytes: 0.01 10*3/uL (ref 0.00–0.07)
Basophils Absolute: 0 10*3/uL (ref 0.0–0.1)
Basophils Relative: 0 %
Eosinophils Absolute: 0.1 10*3/uL (ref 0.0–0.5)
Eosinophils Relative: 1 %
HCT: 34.4 % — ABNORMAL LOW (ref 36.0–46.0)
Hemoglobin: 11.4 g/dL — ABNORMAL LOW (ref 12.0–15.0)
Immature Granulocytes: 0 %
Lymphocytes Relative: 46 %
Lymphs Abs: 1.9 10*3/uL (ref 0.7–4.0)
MCH: 30 pg (ref 26.0–34.0)
MCHC: 33.1 g/dL (ref 30.0–36.0)
MCV: 90.5 fL (ref 80.0–100.0)
Monocytes Absolute: 0.4 10*3/uL (ref 0.1–1.0)
Monocytes Relative: 10 %
Neutro Abs: 1.8 10*3/uL (ref 1.7–7.7)
Neutrophils Relative %: 43 %
Platelets: 139 10*3/uL — ABNORMAL LOW (ref 150–400)
RBC: 3.8 MIL/uL — ABNORMAL LOW (ref 3.87–5.11)
RDW: 13.2 % (ref 11.5–15.5)
WBC: 4.3 10*3/uL (ref 4.0–10.5)
nRBC: 0 % (ref 0.0–0.2)

## 2020-03-05 LAB — D-DIMER, QUANTITATIVE: D-Dimer, Quant: 0.59 ug/mL-FEU — ABNORMAL HIGH (ref 0.00–0.50)

## 2020-03-05 LAB — C-REACTIVE PROTEIN: CRP: 0.8 mg/dL (ref <1.0)

## 2020-03-05 MED ORDER — AZITHROMYCIN 250 MG PO TABS
500.0000 mg | ORAL_TABLET | Freq: Every day | ORAL | Status: AC
  Administered 2020-03-05 – 2020-03-07 (×3): 500 mg via ORAL
  Filled 2020-03-05 (×3): qty 2

## 2020-03-05 MED ORDER — SODIUM CHLORIDE 0.9 % IV SOLN
1.0000 g | Freq: Three times a day (TID) | INTRAVENOUS | Status: AC
  Administered 2020-03-05 – 2020-03-07 (×8): 1 g via INTRAVENOUS
  Filled 2020-03-05 (×9): qty 1

## 2020-03-05 NOTE — Progress Notes (Signed)
PROGRESS NOTE    Sherri Gross  ZOX:[id number] DOB: 1946-02-27 DOA: 03/02/2020 PCP: Lucretia Fieldoyals, Hoover M   Brief Narrative:  Sherri Gross is a 74 y.o. female with medical history significant for history of CVA with left arm weakness, dementia, intellectual disability, and reported history of psychosis, now presenting to the emergency department from her group home for evaluation of lethargy.  Patient was reportedly difficult to wake on 03/01/2020, eventually woke up but has been lethargic.  Patient is unable to contribute much of anything to the history and her contacts cannot be reached at the phone numbers listed. Upon arrival to the ED, patient is found to be afebrile, saturating upper 90s on room air, and with normal heart and respiratory rates and stable blood pressure.  Chemistry panel is notable for an elevated BUN to creatinine ratio, CBC is unremarkable, urinalysis is nitrite positive, and Covid PCR is positive. Patient received dose #1 on 08/02/2019 and dose #2 on 08/30/2019 for COVID-19 vaccine. Hospitalists were asked to admit.  Assessment & Plan:   Principal Problem:   Acute encephalopathy Active Problems:   Dementia (HCC)   Intellectual disability   COVID-19   Pneumonia   Acute encephalopathy, unclear etiology, POA Rule out infectious process, rule out polypharmacy  - Patient presents from group home with lethargy - Covid PCR positive, patient previously vaccinated on 08/02/2019 and 08/30/2019 - Unclear if in the setting of polypharmacy, dehydration or acute symptomatic COVID-19 - She has chronic left arm weakness and cognitive impairment  - Baseline per Tamika/Linda at group home. She can feed herself - sometimes will help bathe herself; talks occasionally but not typically to strangers; can hold up fingers #2 vs #1 for bathroom - Continue IVF while poorly taking PO   Community acquired pneumonia versus acute COVID-19, POA - Presents with lethargy, is found to have positive COVID  pcr and RML opacity on CXR without tachypnea or hypoxia  - Right middle lobe infiltrate, continue azithromycin, ceftriaxone - Continue isolation - patient remains without hypoxia, overt pulmonary symptoms, inflammatory markers flat; unlikely acute symptomatic Covid infection SpO2: 93 % Recent Labs    03/03/20 0147 03/03/20 0147 03/03/20 0531 03/03/20 0811 03/04/20 0341 03/05/20 0425  DDIMER 0.75*   < >  --  0.80* 0.57* 0.59*  LDH 172  --   --   --   --   --   CRP 3.9*   < > 3.8*  --  2.3* 0.8   < > = values in this interval not displayed.   Lab Results  Component Value Date   SARSCOV2NAA POSITIVE (A) 03/02/2020   ESBL E.Coli UTI, likely POA - Not covered by ceftriaxone as above given resistances (was initially thought it would cover possible UTI) - Initiate zosyn x3 days. - Initial UA was unimpressive however culture did result in ESBL E. coli, greater than 100,000 colonies  Dementia; intellectual disability  - Baseline as above, able to feed herself, occasionally speaks, can assist with bathing  - Continue supportive care  History of CVA  - Patient has residual left UE weakness  - Had been on statin previously, pharmacy medication-reconciliation pending    DVT prophylaxis: Lovenox   Code Status: Full Family Communication: Spoke with group home  Status is: Inpatient  Dispo: The patient is from: Group home              Anticipated d/c is to: To be determined  Anticipated d/c date is: 48 to 72 hours pending clinical course              Patient currently not medically stable for discharge given ongoing mental status changes, lethargy, poor p.o. intake requiring IV fluids and possibly acute infectious process as yet to be ruled out  Consultants:   None  Procedures:   None  Antimicrobials:  Azithromycin, ceftriaxone  Subjective: No acute issues or events overnight, this morning patient remains nonverbal, poorly interactive, will open her eyes to  stimulus and track but otherwise would not follow commands, appears not to be far from baseline.  Review of systems otherwise severely limited.  Objective: Vitals:   03/04/20 0826 03/04/20 2007 03/04/20 2048 03/05/20 0535  BP: (!) 142/67 (!) 147/77 (!) 143/72 (!) 178/68  Pulse: 70 76 72 67  Resp: 18 18 16 16   Temp: 99.7 F (37.6 C) 100 F (37.8 C) 98.7 F (37.1 C) (!) 100.5 F (38.1 C)  TempSrc: Oral Oral Oral Oral  SpO2: 97% 93% 94% 93%  Weight:      Height:        Intake/Output Summary (Last 24 hours) at 03/05/2020 0743 Last data filed at 03/05/2020 0600 Gross per 24 hour  Intake 3580.87 ml  Output 100 ml  Net 3480.87 ml   Filed Weights   03/03/20 2350  Weight: 56 kg    Examination:  General: cachetic appearing elderly female resting comfortably in bed, No acute distress. HEENT:  Normocephalic atraumatic.  Sclerae nonicteric, noninjected.  Extraocular movements intact bilaterally. Neck:  Without mass or deformity.  Trachea is midline. Lungs: Right basilar rhonchi without overt wheeze or rales Heart:  Regular rate and rhythm.  Without murmurs, rubs, or gallops. Abdomen:  Soft, nontender, nondistended.  Without guarding or rebound. Extremities: Without cyanosis, clubbing, edema, or obvious deformity. Vascular:  Dorsalis pedis and posterior tibial pulses palpable bilaterally. Skin:  Warm and dry, no erythema, no ulcerations.  Data Reviewed: I have personally reviewed following labs and imaging studies  CBC: Recent Labs  Lab 03/02/20 2156 03/04/20 0341 03/05/20 0425  WBC 7.6 4.7 4.3  NEUTROABS  --  2.0 1.8  HGB 12.9 12.2 11.4*  HCT 40.5 38.7 34.4*  MCV 93.5 94.9 90.5  PLT 162 153 139*   Basic Metabolic Panel: Recent Labs  Lab 03/02/20 2156 03/03/20 0531 03/04/20 0341 03/05/20 0425  NA 143  --  141 138  K 3.5  --  3.1* 3.2*  CL 108  --  105 105  CO2 24  --  22 25  GLUCOSE 108*  --  78 98  BUN 24*  --  15 12  CREATININE 0.58  --  0.53 0.51  CALCIUM  8.9  --  8.4* 8.2*  MG  --  1.9  --   --    GFR: Estimated Creatinine Clearance: 54.5 mL/min (by C-G formula based on SCr of 0.51 mg/dL). Liver Function Tests: Recent Labs  Lab 03/02/20 2156 03/04/20 0341 03/05/20 0425  AST 23 25 24   ALT 16 17 14   ALKPHOS 46 38 39  BILITOT 0.5 0.8 0.6  PROT 7.3 6.0* 5.3*  ALBUMIN 4.1 3.2* 3.0*   No results for input(s): LIPASE, AMYLASE in the last 168 hours. No results for input(s): AMMONIA in the last 168 hours. Coagulation Profile: No results for input(s): INR, PROTIME in the last 168 hours. Cardiac Enzymes: No results for input(s): CKTOTAL, CKMB, CKMBINDEX, TROPONINI in the last 168 hours. BNP (last 3 results) No  results for input(s): PROBNP in the last 8760 hours. HbA1C: No results for input(s): HGBA1C in the last 72 hours. CBG: No results for input(s): GLUCAP in the last 168 hours. Lipid Profile: No results for input(s): CHOL, HDL, LDLCALC, TRIG, CHOLHDL, LDLDIRECT in the last 72 hours. Thyroid Function Tests: No results for input(s): TSH, T4TOTAL, FREET4, T3FREE, THYROIDAB in the last 72 hours. Anemia Panel: No results for input(s): VITAMINB12, FOLATE, FERRITIN, TIBC, IRON, RETICCTPCT in the last 72 hours. Sepsis Labs: Recent Labs  Lab 03/03/20 0147  PROCALCITON <0.10    Recent Results (from the past 240 hour(s))  Urine culture     Status: Abnormal   Collection Time: 03/02/20  8:04 PM   Specimen: Urine, Clean Catch  Result Value Ref Range Status   Specimen Description   Final    URINE, CLEAN CATCH Performed at [facility], [address], [place], [place] [address]    Special Requests   Final    NONE Performed at [facility], [address] [place], [place] [address]    Culture (A)  Final    >=100,000 COLONIES/mL ESCHERICHIA COLI Confirmed Extended Spectrum Beta-Lactamase Producer (ESBL).  In bloodstream infections from ESBL organisms, carbapenems are preferred over  piperacillin/tazobactam. They are shown to have a lower risk of mortality.    Report Status 03/04/2020 FINAL  Final   Organism ID, Bacteria ESCHERICHIA COLI (A)  Final      Susceptibility   Escherichia coli - MIC*    AMPICILLIN >=32 RESISTANT Resistant     CEFAZOLIN >=64 RESISTANT Resistant     CEFTRIAXONE >=64 RESISTANT Resistant     CIPROFLOXACIN >=4 RESISTANT Resistant     GENTAMICIN <=1 SENSITIVE Sensitive     IMIPENEM <=0.25 SENSITIVE Sensitive     NITROFURANTOIN <=16 SENSITIVE Sensitive     TRIMETH/SULFA >=320 RESISTANT Resistant     AMPICILLIN/SULBACTAM 4 SENSITIVE Sensitive     PIP/TAZO <=4 SENSITIVE Sensitive     * >=100,000 COLONIES/mL ESCHERICHIA COLI  SARS Coronavirus 2 by RT PCR (hospital order, performed in [facility] hospital lab) Nasopharyngeal Nasopharyngeal Swab     Status: Abnormal   Collection Time: 03/02/20  9:56 PM   Specimen: Nasopharyngeal Swab  Result Value Ref Range Status   SARS Coronavirus 2 POSITIVE (A) NEGATIVE Final    Comment: RESULT CALLED TO, READ BACK BY AND VERIFIED WITH: RN I HODGES AT 2332 03/02/20 CRUICKSHANK A (NOTE) SARS-CoV-2 target nucleic acids are DETECTED  SARS-CoV-2 RNA is generally detectable in upper respiratory specimens  during the acute phase of infection.  Positive results are indicative  of the presence of the identified virus, but do not rule out bacterial infection or co-infection with other pathogens not detected by the test.  Clinical correlation with patient history and  other diagnostic information is necessary to determine patient infection status.  The expected result is negative.  Fact Sheet for Patients:   BoilerBrush.com.cy   Fact Sheet for Healthcare Providers:   https://pope.com/    This test is not yet approved or cleared by the [place] FDA and  has been authorized for detection and/or diagnosis of SARS-CoV-2 by FDA under an Emergency Use Authorization  (EUA).  This EUA will remain in effect (meaning t his test can be used) for the duration of  the COVID-19 declaration under Section 564(b)(1) of the Act, 21 U.S.C. section 360-bbb-3(b)(1), unless the authorization is terminated or revoked sooner.  Performed at [facility], [address] [place], [place] [address]   Culture, blood (routine x 2)     Status: None (Preliminary result)   Collection Time: 03/02/20  9:57 PM   Specimen: BLOOD  Result Value Ref Range Status   Specimen Description   Final    BLOOD BLOOD RIGHT FOREARM Performed at [facility], [address] [place], [place] [address]    Special Requests   Final    BOTTLES DRAWN AEROBIC AND ANAEROBIC Blood Culture results may not be optimal due to an inadequate volume of blood received in culture bottles Performed at [facility], [address], [place], [place] [address]    Culture   Final    NO GROWTH 2 DAYS Performed at [facility], [address], [place], [place] [address]    Report Status PENDING  Incomplete  Culture, blood (routine x 2)     Status: None (Preliminary result)   Collection Time: 03/02/20  9:59 PM   Specimen: BLOOD  Result Value Ref Range Status   Specimen Description   Final    BLOOD BLOOD LEFT FOREARM Performed at [facility], [address], [place], [place] [address]    Special Requests   Final    BOTTLES DRAWN AEROBIC AND ANAEROBIC Blood Culture adequate volume Performed at [facility], [address], [place], [place] [address]    Culture   Final    NO GROWTH 2 DAYS Performed at [facility], [address], [place], [place] [address]    Report Status PENDING  Incomplete    Radiology Studies: No results found. Scheduled Meds:  enoxaparin (LOVENOX) injection  40 mg Subcutaneous Q24H   feeding supplement (ENSURE ENLIVE)  237 mL Oral BID BM   multivitamin with minerals  1  tablet Oral Daily   potassium chloride  20 mEq Oral BID   Continuous Infusions:  sodium chloride 75 mL/hr at 03/05/20 0659   azithromycin Stopped (03/04/20 2334)   cefTRIAXone (ROCEPHIN)  IV Stopped (03/04/20 2206)     LOS: 2 days   Time spent:  Azucena Fallen, DO [facility]  If 7PM-7AM, please contact night-coverage [email or web address]  03/05/2020, 7:43 AM

## 2020-03-05 NOTE — Progress Notes (Signed)
Pharmacy IV to PO conversion  This patient is receiving azithromycin by the intravenous route. Based on criteria approved by the Pharmacy and Therapeutics Committee, and the Infectious Disease Division, the antibiotic(s) is/are being converted to equivalent oral dose form(s). These criteria include:   Patient being treated for a respiratory tract infection, urinary tract infection, cellulitis, or Clostridium Difficile Associated Diarrhea  The patient is not neutropenic and does not exhibit a GI malabsorption state  The patient is eating (either orally or per tube) and/or has been taking other orally administered medications for at least 24 hours.  The patient is improving clinically (physician assessment and a 24-hour Tmax of <=100.5 F)  If you have any questions about this conversion, please contact the Pharmacy Department (ext [phone]).  Thank you.  Bernadene Person, PharmD, BCPS [phone] 03/05/2020, 12:06 PM

## 2020-03-05 NOTE — Progress Notes (Signed)
Pharmacy Antibiotic Note  Sherri Gross is a 74 y.o. female admitted on 03/02/2020 with ESBL Ecoli UTI.  Pharmacy has been consulted for meropenem dosing.  Plan: Meropenem 1g IV q8 per current renal function To treat with 3 days of meropenem per consult   Height: 5\' 5"  (165.1 cm) Weight: 56 kg (123 lb 7.3 oz) IBW/kg (Calculated) : 57  Temp (24hrs), Avg:99.7 F (37.6 C), Min:98.7 F (37.1 C), Max:100.5 F (38.1 C)  Recent Labs  Lab 03/02/20 2156 03/04/20 0341 03/05/20 0425  WBC 7.6 4.7 4.3  CREATININE 0.58 0.53 0.51    Estimated Creatinine Clearance: 54.5 mL/min (by C-G formula based on SCr of 0.51 mg/dL).    Allergies  Allergen Reactions  . Galantamine     Unknown per mar  . Tuberculin Ppd     unknown    Antimicrobials this admission: 8/10 zithromax >>  8/10 rocephin >> 8/12 8/12 meropenem >>  Dose adjustments this admission:   Microbiology results: 8/12 BCx: ngtd 8/9 UCx: ESBL Ecoli  8/9 COVID: positive  Thank you for allowing pharmacy to be a part of this patient's care.  10/9 03/05/2020 1:52 PM

## 2020-03-05 NOTE — Plan of Care (Signed)

## 2020-03-05 NOTE — Plan of Care (Signed)
°  Problem: Safety: Goal: Ability to remain free from injury will improve Outcome: Progressing   Problem: Skin Integrity: Goal: Risk for impaired skin integrity will decrease Outcome: Progressing   Problem: Nutrition: Goal: Adequate nutrition will be maintained Outcome: Not Progressing

## 2020-03-06 DIAGNOSIS — G9340 Encephalopathy, unspecified: Secondary | ICD-10-CM | POA: Diagnosis not present

## 2020-03-06 LAB — CBC WITH DIFFERENTIAL/PLATELET
Abs Immature Granulocytes: 0.02 10*3/uL (ref 0.00–0.07)
Basophils Absolute: 0 10*3/uL (ref 0.0–0.1)
Basophils Relative: 0 %
Eosinophils Absolute: 0.1 10*3/uL (ref 0.0–0.5)
Eosinophils Relative: 2 %
HCT: 35.1 % — ABNORMAL LOW (ref 36.0–46.0)
Hemoglobin: 11.5 g/dL — ABNORMAL LOW (ref 12.0–15.0)
Immature Granulocytes: 1 %
Lymphocytes Relative: 48 %
Lymphs Abs: 1.9 10*3/uL (ref 0.7–4.0)
MCH: 29.6 pg (ref 26.0–34.0)
MCHC: 32.8 g/dL (ref 30.0–36.0)
MCV: 90.5 fL (ref 80.0–100.0)
Monocytes Absolute: 0.5 10*3/uL (ref 0.1–1.0)
Monocytes Relative: 12 %
Neutro Abs: 1.5 10*3/uL — ABNORMAL LOW (ref 1.7–7.7)
Neutrophils Relative %: 37 %
Platelets: 105 10*3/uL — ABNORMAL LOW (ref 150–400)
RBC: 3.88 MIL/uL (ref 3.87–5.11)
RDW: 13.4 % (ref 11.5–15.5)
WBC: 3.9 10*3/uL — ABNORMAL LOW (ref 4.0–10.5)
nRBC: 0 % (ref 0.0–0.2)

## 2020-03-06 LAB — COMPREHENSIVE METABOLIC PANEL
ALT: 16 U/L (ref 0–44)
AST: 24 U/L (ref 15–41)
Albumin: 2.9 g/dL — ABNORMAL LOW (ref 3.5–5.0)
Alkaline Phosphatase: 39 U/L (ref 38–126)
Anion gap: 6 (ref 5–15)
BUN: 11 mg/dL (ref 8–23)
CO2: 25 mmol/L (ref 22–32)
Calcium: 8.7 mg/dL — ABNORMAL LOW (ref 8.9–10.3)
Chloride: 108 mmol/L (ref 98–111)
Creatinine, Ser: 0.53 mg/dL (ref 0.44–1.00)
GFR calc Af Amer: >60 mL/min (ref >60)
GFR calc non Af Amer: >60 mL/min (ref >60)
Glucose, Bld: 108 mg/dL — ABNORMAL HIGH (ref 70–99)
Potassium: 3.6 mmol/L (ref 3.5–5.1)
Sodium: 139 mmol/L (ref 135–145)
Total Bilirubin: 0.4 mg/dL (ref 0.3–1.2)
Total Protein: 5.3 g/dL — ABNORMAL LOW (ref 6.5–8.1)

## 2020-03-06 LAB — D-DIMER, QUANTITATIVE: D-Dimer, Quant: 0.68 ug/mL-FEU — ABNORMAL HIGH (ref 0.00–0.50)

## 2020-03-06 LAB — C-REACTIVE PROTEIN: CRP: 0.7 mg/dL (ref <1.0)

## 2020-03-06 NOTE — TOC Initial Note (Signed)
Transition of Care [facility]) - Initial/Assessment Note    Patient Details  Name: Sherri Gross MRN: [id number] Date of Birth: 1945/12/31  Transition of [facility]) CM/SW Contact:    Darleene Cleaver, LCSW Phone Number: 03/06/2020, 8:48 PM  Clinical Narrative:                  Patient is a 74 year old female who has dementia, and lives in a group home.  Patient has been at group home for several years.  Patient's legal guardian is her sister, and patient has a group home RN who is on call.  Patient is Covid +, CSW completed assessment by speaking to group home RN Okey Dupre.  Patient is normally wheelchair bound, does not speak very much, and needs assistance with ADLs and sometimes feeding herself.  Patient has never had oxygen, her PCP comes to the house weekly.  CSW discussed with group home RN, and she stated they have nurses staffing in the group home and they do not feel patient would benefit from [facility].  Per Victorino Dike, patient can return on Monday if she is medically ready for discharge, she will need the DC summary and group home FL2 faxed to [phone].  CSW updated physician that patient can return to group home on Monday if she is medically ready.  CSW to continue to follow patient's progress throughout discharge planning.  Expected Discharge Plan: Group Home Barriers to Discharge: Other (comment), Continued Medical Work up (Group Home can not accept back until Monday.)   Patient Goals and CMS Choice Patient states their goals for this hospitalization and ongoing recovery are:: To return back to [facility] group home. CMS Medicare.gov Compare Post Acute Care list provided to:: Patient Represenative (must comment) Choice offered to / list presented to : NA (On call RN for group home.)  Expected Discharge Plan and Services Expected Discharge Plan: Group Home In-house Referral: NA   Post Acute Care Choice: NA Living arrangements for the past 2 months: Group Home                    DME Agency: NA       HH Arranged: NA          Prior Living Arrangements/Services Living arrangements for the past 2 months: Group Home Lives with:: Other (Comment) (Group Home) Patient language and need for interpreter reviewed:: Yes Do you feel safe going back to the place where you live?: Yes      Need for Family Participation in Patient Care: Yes (Comment) Care giver support system in place?: Yes (comment)   Criminal Activity/Legal Involvement Pertinent to Current Situation/Hospitalization: No - Comment as needed  Activities of Daily Living Home Assistive Devices/Equipment: Wheelchair ADL Screening (condition at time of admission) Patient's cognitive ability adequate to safely complete daily activities?: No Is the patient deaf or have difficulty hearing?: Yes (HOH) Does the patient have difficulty seeing, even when wearing glasses/contacts?: Yes (hx bilateral cataracts) Does the patient have difficulty concentrating, remembering, or making decisions?: Yes Patient able to express need for assistance with ADLs?: No Does the patient have difficulty dressing or bathing?: Yes Independently performs ADLs?: No Communication: Dependent Is this a change from baseline?: Change from baseline, expected to last <3 days Dressing (OT): Needs assistance Is this a change from baseline?: Pre-admission baseline Grooming: Needs assistance Is this a change from baseline?: Pre-admission baseline Feeding: Needs assistance Is this a change from baseline?: Pre-admission baseline Bathing: Needs assistance  Is this a change from baseline?: Pre-admission baseline Toileting: Needs assistance Is this a change from baseline?: Pre-admission baseline In/Out Bed: Needs assistance Is this a change from baseline?: Pre-admission baseline Walks in Home: Dependent Is this a change from baseline?: Pre-admission baseline Does the patient have difficulty walking or climbing stairs?:  Yes Weakness of Legs: Both Weakness of Arms/Hands: Both (hx of left arm being weaker per computer notes 07/27/16)  Permission Sought/Granted Permission sought to share information with : Facility Medical sales representative, Family Supports Permission granted to share information with : Yes, Release of Information Signed  Share Information with NAME: [facility],  Nurse Other   [phone] or Redd,[name] Sister   [phone]  Permission granted to share info w AGENCY: [facility] group home        Emotional Assessment Appearance:: Appears stated age   Affect (typically observed): Accepting, Calm, Appropriate   Alcohol / Substance Use: Not Applicable Psych Involvement: No (comment)  Admission diagnosis:  Acute encephalopathy [G93.40] COVID-19 [U07.1] Patient Active Problem List   Diagnosis Date Noted  . Acute encephalopathy 03/03/2020  . COVID-19 03/03/2020  . Pneumonia 03/03/2020  . Memory loss 07/27/2016  . Left spastic hemiparesis (HCC) 03/27/2016  . Dementia (HCC) 12/22/2009  . Intellectual disability 12/22/2009  . 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 HYPERLIPIDEMIA 11/27/2009   PCP:  Lucretia Field Pharmacy:   [phone] - [place], [place] - [address] AT [facility] [address]  [place] [address] Phone: [phone] Fax: [phone]     Social Determinants of Health (SDOH) Interventions    Readmission Risk Interventions No flowsheet data found.

## 2020-03-06 NOTE — Care Management Important Message (Signed)
Important Message  Patient Details IM Letter given to the Patient Name: Sherri Gross MRN: [id number] Date of Birth: 1946/06/13   Medicare Important Message Given:  Yes     Caren Macadam 03/06/2020, 10:45 AM

## 2020-03-06 NOTE — Plan of Care (Signed)

## 2020-03-06 NOTE — Progress Notes (Signed)
PROGRESS NOTE    Sherri Gross  IHK:[id number] DOB: April 05, 1946 DOA: 03/02/2020 PCP: Lucretia Field   Brief Narrative:  Sherri Gross is a 74 y.o. female with medical history significant for history of CVA with left arm weakness, dementia, intellectual disability, and reported history of psychosis, now presenting to the emergency department from her group home for evaluation of lethargy.  Patient was reportedly difficult to wake on 03/01/2020, eventually woke up but has been lethargic.  Patient is unable to contribute much of anything to the history and her contacts cannot be reached at the phone numbers listed. Upon arrival to the ED, patient is found to be afebrile, saturating upper 90s on room air, and with normal heart and respiratory rates and stable blood pressure.  Chemistry panel is notable for an elevated BUN to creatinine ratio, CBC is unremarkable, urinalysis is nitrite positive, and Covid PCR is positive. Patient received dose #1 on 08/02/2019 and dose #2 on 08/30/2019 for COVID-19 vaccine. Hospitalists were asked to admit.  Assessment & Plan:   Principal Problem:   Acute encephalopathy Active Problems:   Dementia (HCC)   Intellectual disability   COVID-19   Pneumonia   Acute encephalopathy, unclear etiology, POA, resolving Likely secondary to infectious process, rule out polypharmacy  - Patient presents from group home with lethargy, change from baseline which is alert can feed herself, assistance with ADLs, wheelchair-bound can typically hold up 1-2 fingers to indicate need for bathroom use: This is per [facility] [facility] and Bonita Quin at the group home. - Covid PCR positive, patient previously vaccinated on 08/02/2019 and 08/30/2019 - this appears to be incidental given patient has no inflammatory markers or hypoxia or chest x-ray findings consistent with acute Covid pneumonia - Likely secondary to ESBL UTI as below with notable covid positive PCR - She has chronic left arm weakness and cognitive  impairment  - Continue IVF while poorly taking PO   ESBL E.Coli UTI, likely POA - Likely the etiology of above encephalopathy - Not covered by ceftriaxone as above given resistances (was initially thought it would cover possible UTI) - Initiate Meropenem per discussion with pharmacy - Initial UA was unimpressive however culture did result in ESBL E. coli, greater than 100,000 colonies  Community acquired pneumonia versus acute COVID-19, POA - Presents with lethargy, is found to have positive COVID pcr and RML opacity on CXR without tachypnea or hypoxia  - Right middle lobe infiltrate, continue azithromycin, ceftriaxone - Continue isolation - patient remains without hypoxia, overt pulmonary symptoms, inflammatory markers flat; unlikely acute symptomatic Covid infection SpO2: 96 % Recent Labs    03/04/20 0341 03/05/20 0425 03/06/20 0447  DDIMER 0.57* 0.59* 0.68*  CRP 2.3* 0.8 0.7   Lab Results  Component Value Date   SARSCOV2NAA POSITIVE (A) 03/02/2020    Dementia; intellectual disability  - Baseline as above, able to feed herself, occasionally speaks, can assist with bathing  - Continue supportive care  History of CVA  - Patient has residual left UE weakness  - Had been on statin previously, pharmacy medication-reconciliation pending    DVT prophylaxis: Lovenox   Code Status: Full Family Communication: Spoke with group home  Status is: Inpatient  Dispo: The patient is from: Group home              Anticipated d/c is to: To be determined              Anticipated d/c date is: 24-48 hours pending clinical course  Patient currently not medically stable for discharge given ongoing mental status changes, lethargy, poor p.o. intake requiring IV fluids and possibly acute infectious process as yet to be ruled out  Consultants:   None  Procedures:   None  Antimicrobials:  Azithromycin, meropenem  Subjective: No acute issues or events overnight, much more  awake and alert this morning attempting to verbalize and say hello, mouthing "bathroom" at one point.  Review of systems is severely limited given her baseline but patient is certainly much more awake and alert this morning.   Objective: Vitals:   03/05/20 1511 03/05/20 2108 03/06/20 0605 03/06/20 0638  BP: 125/73 (!) 152/71 (!) 160/83 (!) 145/72  Pulse: 68 90 75 72  Resp: 19 20 16 18   Temp: 98 F (36.7 C) (!) 101.1 F (38.4 C) 99.1 F (37.3 C) 99.9 F (37.7 C)  TempSrc: Oral Oral Oral Oral  SpO2: 99% 96% 95% 96%  Weight:      Height:        Intake/Output Summary (Last 24 hours) at 03/06/2020 0742 Last data filed at 03/06/2020 0600 Gross per 24 hour  Intake 1456.77 ml  Output 1050 ml  Net 406.77 ml   Filed Weights   03/03/20 2350  Weight: 56 kg    Examination:  General: cachetic appearing elderly female resting comfortably in bed, awake alert tracking with eyes and following simple commands. HEENT:  Normocephalic atraumatic.  Sclerae nonicteric, noninjected.  Extraocular movements intact bilaterally. Neck:  Without mass or deformity.  Trachea is midline. Lungs: Right basilar rhonchi without overt wheeze or rales Heart:  Regular rate and rhythm.  Without murmurs, rubs, or gallops. Abdomen:  Soft, nontender, nondistended.  Without guarding or rebound. Extremities: Without cyanosis, clubbing, edema, or obvious deformity. Vascular:  Dorsalis pedis and posterior tibial pulses palpable bilaterally. Skin:  Warm and dry, no erythema, no ulcerations.  Data Reviewed: I have personally reviewed following labs and imaging studies  CBC: Recent Labs  Lab 03/02/20 2156 03/04/20 0341 03/05/20 0425 03/06/20 0447  WBC 7.6 4.7 4.3 3.9*  NEUTROABS  --  2.0 1.8 1.5*  HGB 12.9 12.2 11.4* 11.5*  HCT 40.5 38.7 34.4* 35.1*  MCV 93.5 94.9 90.5 90.5  PLT 162 153 139* 105*   Basic Metabolic Panel: Recent Labs  Lab 03/02/20 2156 03/03/20 0531 03/04/20 0341 03/05/20 0425  03/06/20 0447  NA 143  --  141 138 139  K 3.5  --  3.1* 3.2* 3.6  CL 108  --  105 105 108  CO2 24  --  22 25 25   GLUCOSE 108*  --  78 98 108*  BUN 24*  --  15 12 11   CREATININE 0.58  --  0.53 0.51 0.53  CALCIUM 8.9  --  8.4* 8.2* 8.7*  MG  --  1.9  --   --   --    GFR: Estimated Creatinine Clearance: 54.5 mL/min (by C-G formula based on SCr of 0.53 mg/dL). Liver Function Tests: Recent Labs  Lab 03/02/20 2156 03/04/20 0341 03/05/20 0425 03/06/20 0447  AST 23 25 24 24   ALT 16 17 14 16   ALKPHOS 46 38 39 39  BILITOT 0.5 0.8 0.6 0.4  PROT 7.3 6.0* 5.3* 5.3*  ALBUMIN 4.1 3.2* 3.0* 2.9*   No results for input(s): LIPASE, AMYLASE in the last 168 hours. No results for input(s): AMMONIA in the last 168 hours. Coagulation Profile: No results for input(s): INR, PROTIME in the last 168 hours. Cardiac Enzymes: No results for  input(s): CKTOTAL, CKMB, CKMBINDEX, TROPONINI in the last 168 hours. BNP (last 3 results) No results for input(s): PROBNP in the last 8760 hours. HbA1C: No results for input(s): HGBA1C in the last 72 hours. CBG: No results for input(s): GLUCAP in the last 168 hours. Lipid Profile: No results for input(s): CHOL, HDL, LDLCALC, TRIG, CHOLHDL, LDLDIRECT in the last 72 hours. Thyroid Function Tests: No results for input(s): TSH, T4TOTAL, FREET4, T3FREE, THYROIDAB in the last 72 hours. Anemia Panel: No results for input(s): VITAMINB12, FOLATE, FERRITIN, TIBC, IRON, RETICCTPCT in the last 72 hours. Sepsis Labs: Recent Labs  Lab 03/03/20 0147  PROCALCITON <0.10    Recent Results (from the past 240 hour(s))  Urine culture     Status: Abnormal   Collection Time: 03/02/20  8:04 PM   Specimen: Urine, Clean Catch  Result Value Ref Range Status   Specimen Description   Final    URINE, CLEAN CATCH Performed at [facility], [address] [place], [place] [address]    Special Requests   Final    NONE Performed at [facility], [address] [place], [place] [address]    Culture (A)  Final    >=100,000 COLONIES/mL ESCHERICHIA COLI Confirmed Extended Spectrum Beta-Lactamase Producer (ESBL).  In bloodstream infections from ESBL organisms, carbapenems are preferred over piperacillin/tazobactam. They are shown to have a lower risk of mortality.    Report Status 03/04/2020 FINAL  Final   Organism ID, Bacteria ESCHERICHIA COLI (A)  Final      Susceptibility   Escherichia coli - MIC*    AMPICILLIN >=32 RESISTANT Resistant     CEFAZOLIN >=64 RESISTANT Resistant     CEFTRIAXONE >=64 RESISTANT Resistant     CIPROFLOXACIN >=4 RESISTANT Resistant     GENTAMICIN <=1 SENSITIVE Sensitive     IMIPENEM <=0.25 SENSITIVE Sensitive     NITROFURANTOIN <=16 SENSITIVE Sensitive     TRIMETH/SULFA >=320 RESISTANT Resistant     AMPICILLIN/SULBACTAM 4 SENSITIVE Sensitive     PIP/TAZO <=4 SENSITIVE Sensitive     * >=100,000 COLONIES/mL ESCHERICHIA COLI  SARS Coronavirus 2 by RT PCR (hospital order, performed in [facility] hospital lab) Nasopharyngeal Nasopharyngeal Swab     Status: Abnormal   Collection Time: 03/02/20  9:56 PM   Specimen: Nasopharyngeal Swab  Result Value Ref Range Status   SARS Coronavirus 2 POSITIVE (A) NEGATIVE Final    Comment: RESULT CALLED TO, READ BACK BY AND VERIFIED WITH: RN I HODGES AT 2332 03/02/20 CRUICKSHANK A (NOTE) SARS-CoV-2 target nucleic acids are DETECTED  SARS-CoV-2 RNA is generally detectable in upper respiratory specimens  during the acute phase of infection.  Positive results are indicative  of the presence of the identified virus, but do not rule out bacterial infection or co-infection with other pathogens not detected by the test.  Clinical correlation with patient history and  other diagnostic information is necessary to determine patient infection status.  The expected result is negative.  Fact Sheet for Patients:   BoilerBrush.com.cyhttps://www.fda.gov/media/136312/download   Fact Sheet  for Healthcare Providers:   https://pope.com/https://www.fda.gov/media/136313/download    This test is not yet approved or cleared by the [place] FDA and  has been authorized for detection and/or diagnosis of SARS-CoV-2 by FDA under an Emergency Use Authorization (EUA).  This EUA will remain in effect (meaning t his test can be used) for the duration of  the COVID-19 declaration under Section 564(b)(1) of the Act, 21 U.S.C. section 360-bbb-3(b)(1), unless the authorization  is terminated or revoked sooner.  Performed at [facility], [address], [place], [place] [address]   Culture, blood (routine x 2)     Status: None (Preliminary result)   Collection Time: 03/02/20  9:57 PM   Specimen: BLOOD  Result Value Ref Range Status   Specimen Description   Final    BLOOD BLOOD RIGHT FOREARM Performed at [facility], [address], [place], [place] [address]    Special Requests   Final    BOTTLES DRAWN AEROBIC AND ANAEROBIC Blood Culture results may not be optimal due to an inadequate volume of blood received in culture bottles Performed at [facility], [address] [place], [place] [address]    Culture   Final    NO GROWTH 3 DAYS Performed at [facility], [address], [place], [place] [address]    Report Status PENDING  Incomplete  Culture, blood (routine x 2)     Status: None (Preliminary result)   Collection Time: 03/02/20  9:59 PM   Specimen: BLOOD  Result Value Ref Range Status   Specimen Description   Final    BLOOD BLOOD LEFT FOREARM Performed at [facility], [address], [place], [place] [address]    Special Requests   Final    BOTTLES DRAWN AEROBIC AND ANAEROBIC Blood Culture adequate volume Performed at [facility], [address], [place], [place] [address]    Culture   Final    NO GROWTH 3 DAYS Performed at [facility], [address] [place], [place]  [address]    Report Status PENDING  Incomplete    Radiology Studies: No results found. Scheduled Meds: . azithromycin  500 mg Oral QHS  . enoxaparin (LOVENOX) injection  40 mg Subcutaneous Q24H  . feeding supplement (ENSURE ENLIVE)  237 mL Oral BID BM  . multivitamin with minerals  1 tablet Oral Daily  . potassium chloride  20 mEq Oral BID   Continuous Infusions: . sodium chloride 75 mL/hr at 03/06/20 0143  . meropenem (MERREM) IV 1 g (03/05/20 2349)     LOS: 3 days   Time spent:  Azucena Fallen, DO [facility]  If 7PM-7AM, please contact night-coverage [email or web address]  03/06/2020, 7:42 AM

## 2020-03-07 DIAGNOSIS — G9340 Encephalopathy, unspecified: Secondary | ICD-10-CM | POA: Diagnosis not present

## 2020-03-07 LAB — CBC WITH DIFFERENTIAL/PLATELET
Abs Immature Granulocytes: 0.02 10*3/uL (ref 0.00–0.07)
Basophils Absolute: 0 10*3/uL (ref 0.0–0.1)
Basophils Relative: 0 %
Eosinophils Absolute: 0.1 10*3/uL (ref 0.0–0.5)
Eosinophils Relative: 3 %
HCT: 34.2 % — ABNORMAL LOW (ref 36.0–46.0)
Hemoglobin: 11.1 g/dL — ABNORMAL LOW (ref 12.0–15.0)
Immature Granulocytes: 1 %
Lymphocytes Relative: 40 %
Lymphs Abs: 1.7 10*3/uL (ref 0.7–4.0)
MCH: 29.8 pg (ref 26.0–34.0)
MCHC: 32.5 g/dL (ref 30.0–36.0)
MCV: 91.9 fL (ref 80.0–100.0)
Monocytes Absolute: 0.5 10*3/uL (ref 0.1–1.0)
Monocytes Relative: 11 %
Neutro Abs: 1.9 10*3/uL (ref 1.7–7.7)
Neutrophils Relative %: 45 %
Platelets: 89 10*3/uL — ABNORMAL LOW (ref 150–400)
RBC: 3.72 MIL/uL — ABNORMAL LOW (ref 3.87–5.11)
RDW: 13.4 % (ref 11.5–15.5)
WBC: 4.2 10*3/uL (ref 4.0–10.5)
nRBC: 0 % (ref 0.0–0.2)

## 2020-03-07 LAB — COMPREHENSIVE METABOLIC PANEL
ALT: 15 U/L (ref 0–44)
AST: 29 U/L (ref 15–41)
Albumin: 2.7 g/dL — ABNORMAL LOW (ref 3.5–5.0)
Alkaline Phosphatase: 43 U/L (ref 38–126)
Anion gap: 6 (ref 5–15)
BUN: 13 mg/dL (ref 8–23)
CO2: 22 mmol/L (ref 22–32)
Calcium: 8 mg/dL — ABNORMAL LOW (ref 8.9–10.3)
Chloride: 104 mmol/L (ref 98–111)
Creatinine, Ser: 0.49 mg/dL (ref 0.44–1.00)
GFR calc Af Amer: >60 mL/min (ref >60)
GFR calc non Af Amer: >60 mL/min (ref >60)
Glucose, Bld: 111 mg/dL — ABNORMAL HIGH (ref 70–99)
Potassium: 3.9 mmol/L (ref 3.5–5.1)
Sodium: 132 mmol/L — ABNORMAL LOW (ref 135–145)
Total Bilirubin: 0.4 mg/dL (ref 0.3–1.2)
Total Protein: 4.9 g/dL — ABNORMAL LOW (ref 6.5–8.1)

## 2020-03-07 LAB — CULTURE, BLOOD (ROUTINE X 2)
Culture: NO GROWTH
Culture: NO GROWTH
Special Requests: ADEQUATE

## 2020-03-07 NOTE — Discharge Summary (Addendum)
Physician Discharge Summary  MAKHIYA Gross MEQ:[id number] DOB: 03-May-1946 DOA: 03/02/2020  PCP: Lucretia Field  Admit date: 03/02/2020 Discharge date: 03/07/2020  Admitted From: Group home Disposition: Group home  Recommendations for Outpatient Follow-up:  1. 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 Patient was reportedly difficult to wake on 03/01/2020, eventually woke up but has been lethargic. Patient is unable to contribute much of anything to the history and her contacts cannot be reached at the phone numbers listed.Upon arrival to the ED, patient is found to be afebrile, saturating upper 90s on room air, and with normal heart and respiratory rates and stable blood pressure. Chemistry panel is notable for an elevated BUN to creatinine ratio, CBC is unremarkable, urinalysis is nitrite positive, and Covid PCR is positive. Patient received dose #1 on 08/02/2019 and dose #2 on 08/30/2019 for COVID-19 vaccine. Hospitalistswere asked to admit.  Patient presented with altered mental status from baseline, tested positive for COVID-19 despite 2 doses of vaccine but remained without hypoxia or respiratory symptoms, patient's urine culture was positive for ESBL E. coli and likely the cause of her mental status changes.  Covid positive testing was incidental finding.  At this time patient has completed meropenem per discussion with pharmacy and otherwise back to baseline stable and agreeable for discharge home.  Awaiting transport to facility.  Patient medically stable for discharge - facility states they do not have  RN on staff this weekend to monitor the patient or administer any new medications. We discussed the patient does not have any new medications, she does not require any monitoring as her COVID-19 diagnosis is incidental finding. She was here for a simple UTI and altered mental status which have now resolved and she would otherwise be stable for discharge. Call back late afternoon - now staff is saying there is no "safe place" for her to go until Monday despite the fact that we updated them the patient would be medically stable Friday the 13th. They are unable to explain what will change Monday that will make her discharge "safe" and that whatever needs to be done cannot be done today. Multiple phone calls with the facility were made - on call staff would not allow me to call administrator at their facility.   Discharge Diagnoses:  Principal Problem:   Acute encephalopathy Active Problems:   Dementia (HCC)   Intellectual disability   COVID-19   Pneumonia    Discharge Instructions  Discharge Instructions    Diet - low sodium heart healthy   Complete by: As directed    Increase activity slowly   Complete by: As directed      Allergies as of 03/07/2020      Reactions   Galantamine    Unknown per mar   Tuberculin Ppd    unknown      Medication List    TAKE these medications   amLODipine 2.5 MG tablet Commonly known as: NORVASC Take 2.5 mg by mouth at bedtime.   fenofibrate 145 MG tablet Commonly known as: TRICOR Take 145 mg by mouth daily.   memantine 10 MG tablet Commonly known as: NAMENDA Take 10 mg by mouth 2 (two)  times daily.   Replens Gel Place 1 applicator vaginally once a week. Every Mon   tizanidine 2 MG capsule Commonly known as: ZANAFLEX Take 2 mg by mouth as directed. Take on MWF & Sat   vitamin B-12 1000 MCG tablet Commonly known as: CYANOCOBALAMIN Take 1,000 mcg by mouth daily.   Vitamin D3 50 MCG (2000 UT) Tabs Take 2 capsules by mouth daily.        Allergies  Allergen Reactions  . Galantamine     Unknown per mar  . Tuberculin Ppd     unknown    Consultations: None  Procedures/Studies: DG Chest 2 View  Result Date: 03/02/2020 CLINICAL DATA:  Rhonchi. EXAM: CHEST - 2 VIEW COMPARISON:  June 06, 2014 FINDINGS: There is an airspace opacity in the right middle lobe. There is no pneumothorax. No large pleural effusion. The heart size is normal. Aortic calcifications are noted. There are multiple lucencies overlying the patient's hepatic dome which are favored to represent interposed loops of bowel. There is osteopenia without evidence for an acute displaced compression fracture. IMPRESSION: Right middle lobe airspace opacity compatible with pneumonia. Follow-up to radiologic resolution is recommended. Electronically Signed   By: Katherine Mantlehristopher  Green M.D.   On: 03/02/2020 18:42     Subjective: Review of systems severely limited given patient's baseline mental status but no acute issues or events noted overnight by staffing.   Discharge Exam: Vitals:   03/07/20 0439 03/07/20 0900  BP: 140/87   Pulse: 73   Resp: 18   Temp: 100.1 F (37.8 C)   SpO2: 94% 93%   Vitals:   03/06/20 1538 03/06/20 2219 03/07/20 0439 03/07/20 0900  BP: 98/73 136/85 140/87   Pulse: 72 73 73   Resp: 19 20 18    Temp: 99.2 F (37.3 C) 99 F (37.2 C) 100.1 F (37.8 C)   TempSrc: Axillary Oral Oral   SpO2: 97% 95% 94% 93%  Weight:      Height:        General: Pt is alert, awake, not in acute distress, tracks movements, follows simple commands, tolerating p.o. safely. Cardiovascular: RRR, S1/S2 +, no rubs, no gallops Respiratory: CTA bilaterally, no wheezing, no rhonchi Abdominal: Soft, NT, ND, bowel sounds + Extremities: no edema, no cyanosis    The results of significant diagnostics from this hospitalization (including imaging, microbiology, ancillary and laboratory) are listed below for reference.     Microbiology: Recent Results  (from the past 240 hour(s))  Urine culture     Status: Abnormal   Collection Time: 03/02/20  8:04 PM   Specimen: Urine, Clean Catch  Result Value Ref Range Status   Specimen Description   Final    URINE, CLEAN CATCH Performed at [facility], [address] [place], [place] [address]    Special Requests   Final    NONE Performed at [facility], [address] [place], [place] [id number]    Culture (A)  Final    >=100,000 COLONIES/mL ESCHERICHIA COLI Confirmed Extended Spectrum Beta-Lactamase Producer (ESBL).  In bloodstream infections from ESBL organisms, carbapenems are preferred over piperacillin/tazobactam. They are shown to have a lower risk of mortality.    Report Status 03/04/2020 FINAL  Final   Organism ID, Bacteria ESCHERICHIA COLI (A)  Final      Susceptibility   Escherichia coli - MIC*    AMPICILLIN >=32 RESISTANT Resistant     CEFAZOLIN >=64 RESISTANT Resistant     CEFTRIAXONE >=64 RESISTANT  Resistant     CIPROFLOXACIN >=4 RESISTANT Resistant     GENTAMICIN <=1 SENSITIVE Sensitive     IMIPENEM <=0.25 SENSITIVE Sensitive     NITROFURANTOIN <=16 SENSITIVE Sensitive     TRIMETH/SULFA >=320 RESISTANT Resistant     AMPICILLIN/SULBACTAM 4 SENSITIVE Sensitive     PIP/TAZO <=4 SENSITIVE Sensitive     * >=100,000 COLONIES/mL ESCHERICHIA COLI  SARS Coronavirus 2 by RT PCR (hospital order, performed in [facility] hospital lab) Nasopharyngeal Nasopharyngeal Swab     Status: Abnormal   Collection Time: 03/02/20  9:56 PM   Specimen: Nasopharyngeal Swab  Result Value Ref Range Status   SARS Coronavirus 2 POSITIVE (A) NEGATIVE Final    Comment: RESULT CALLED TO, READ BACK BY AND VERIFIED WITH: RN I HODGES AT 2332 03/02/20 CRUICKSHANK A (NOTE) SARS-CoV-2 target nucleic acids are DETECTED  SARS-CoV-2 RNA is generally detectable in upper respiratory specimens  during the acute phase of infection.  Positive results are indicative  of the  presence of the identified virus, but do not rule out bacterial infection or co-infection with other pathogens not detected by the test.  Clinical correlation with patient history and  other diagnostic information is necessary to determine patient infection status.  The expected result is negative.  Fact Sheet for Patients:   BoilerBrush.com.cy   Fact Sheet for Healthcare Providers:   https://pope.com/    This test is not yet approved or cleared by the [place] FDA and  has been authorized for detection and/or diagnosis of SARS-CoV-2 by FDA under an Emergency Use Authorization (EUA).  This EUA will remain in effect (meaning t his test can be used) for the duration of  the COVID-19 declaration under Section 564(b)(1) of the Act, 21 U.S.C. section 360-bbb-3(b)(1), unless the authorization is terminated or revoked sooner.  Performed at [facility], [address], [place], [place] [address]   Culture, blood (routine x 2)     Status: None (Preliminary result)   Collection Time: 03/02/20  9:57 PM   Specimen: BLOOD  Result Value Ref Range Status   Specimen Description   Final    BLOOD BLOOD RIGHT FOREARM Performed at [facility], [address], [place], [place] [address]    Special Requests   Final    BOTTLES DRAWN AEROBIC AND ANAEROBIC Blood Culture results may not be optimal due to an inadequate volume of blood received in culture bottles Performed at [facility], [address], [place], [place] [address]    Culture   Final    NO GROWTH 4 DAYS Performed at [facility], [address] [place], [place] [address]    Report Status PENDING  Incomplete  Culture, blood (routine x 2)     Status: None (Preliminary result)   Collection Time: 03/02/20  9:59 PM   Specimen: BLOOD  Result Value Ref Range Status   Specimen Description   Final    BLOOD BLOOD LEFT  FOREARM Performed at [facility], [address] [place], [place] [address]    Special Requests   Final    BOTTLES DRAWN AEROBIC AND ANAEROBIC Blood Culture adequate volume Performed at [facility], [address] [place], [place] [address]    Culture   Final    NO GROWTH 4 DAYS Performed at [facility], [address] [place], [place] [address]    Report Status PENDING  Incomplete     Labs:  BNP (last 3 results) No results for input(s): BNP in the last 8760 hours. Basic Metabolic Panel: Recent Labs  Lab 03/02/20 2156 03/03/20 0531 03/04/20 0341 03/05/20 0425 03/06/20 0447 03/07/20 0419  NA 143  --  141 138 139 132*  K 3.5  --  3.1* 3.2* 3.6 3.9  CL 108  --  105 105 108 104  CO2 24  --  GLUCOSE 108*  --  78 98 108* 111*  BUN 24*  --  CREATININE 0.58  --  0.53 0.51 0.53 0.49  CALCIUM 8.9  --  8.4* 8.2* 8.7* 8.0*  MG  --  1.9  --   --   --   --    Liver Function Tests: Recent Labs  Lab 03/02/20 2156 03/04/20 0341 03/05/20 0425 03/06/20 0447 03/07/20 0419  AST ALT ALKPHOS 46 38 39 39 43  BILITOT 0.5 0.8 0.6 0.4 0.4  PROT 7.3 6.0* 5.3* 5.3* 4.9*  ALBUMIN 4.1 3.2* 3.0* 2.9* 2.7*   No results for input(s): LIPASE, AMYLASE in the last 168 hours. No results for input(s): AMMONIA in the last 168 hours. CBC: Recent Labs  Lab 03/02/20 2156 03/04/20 0341 03/05/20 0425 03/06/20 0447 03/07/20 0419  WBC 7.6 4.7 4.3 3.9* 4.2  NEUTROABS  --  2.0 1.8 1.5* 1.9  HGB 12.9 12.2 11.4* 11.5* 11.1*  HCT 40.5 38.7 34.4* 35.1* 34.2*  MCV 93.5 94.9 90.5 90.5 91.9  PLT 162 153 139* 105* 89*   Cardiac Enzymes: No results for input(s): CKTOTAL, CKMB, CKMBINDEX, TROPONINI in the last 168 hours. BNP: Invalid input(s): POCBNP CBG: No results for input(s): GLUCAP in the last 168 hours. D-Dimer Recent Labs    03/05/20 0425 03/06/20 0447  DDIMER 0.59* 0.68*   Hgb  A1c No results for input(s): HGBA1C in the last 72 hours. Lipid Profile No results for input(s): CHOL, HDL, LDLCALC, TRIG, CHOLHDL, LDLDIRECT in the last 72 hours. Thyroid function studies No results for input(s): TSH, T4TOTAL, T3FREE, THYROIDAB in the last 72 hours.  Invalid input(s): FREET3 Anemia work up No results for input(s): VITAMINB12, FOLATE, FERRITIN, TIBC, IRON, RETICCTPCT in the last 72 hours. Urinalysis    Component Value Date/Time   COLORURINE YELLOW 03/02/2020 2004   APPEARANCEUR CLEAR 03/02/2020 2004   LABSPEC 1.021 03/02/2020 2004   PHURINE 6.0 03/02/2020 2004   GLUCOSEU NEGATIVE 03/02/2020 2004   HGBUR NEGATIVE 03/02/2020 2004   BILIRUBINUR NEGATIVE 03/02/2020 2004   KETONESUR NEGATIVE 03/02/2020 2004   PROTEINUR 30 (A) 03/02/2020 2004   UROBILINOGEN 1.0 01/17/2014 1616   NITRITE POSITIVE (A) 03/02/2020 2004   LEUKOCYTESUR NEGATIVE 03/02/2020 2004   Sepsis Labs Invalid input(s): PROCALCITONIN,  WBC,  LACTICIDVEN Microbiology Recent Results (from the past 240 hour(s))  Urine culture     Status: Abnormal   Collection Time: 03/02/20  8:04 PM   Specimen: Urine, Clean Catch  Result Value Ref Range Status   Specimen Description   Final    URINE, CLEAN CATCH Performed at [facility], [address] [place], [place] [address]    Special Requests   Final    NONE Performed at [facility], [address], [place], [place] [address]    Culture (A)  Final    >=100,000 COLONIES/mL ESCHERICHIA COLI Confirmed Extended Spectrum Beta-Lactamase Producer (ESBL).  In bloodstream infections from ESBL organisms, carbapenems are preferred  over piperacillin/tazobactam. They are shown to have a lower risk of mortality.    Report Status 03/04/2020 FINAL  Final   Organism ID, Bacteria ESCHERICHIA COLI (A)  Final      Susceptibility   Escherichia coli - MIC*    AMPICILLIN >=32 RESISTANT Resistant     CEFAZOLIN >=64 RESISTANT Resistant      CEFTRIAXONE >=64 RESISTANT Resistant     CIPROFLOXACIN >=4 RESISTANT Resistant     GENTAMICIN <=1 SENSITIVE Sensitive     IMIPENEM <=0.25 SENSITIVE Sensitive     NITROFURANTOIN <=16 SENSITIVE Sensitive     TRIMETH/SULFA >=320 RESISTANT Resistant     AMPICILLIN/SULBACTAM 4 SENSITIVE Sensitive     PIP/TAZO <=4 SENSITIVE Sensitive     * >=100,000 COLONIES/mL ESCHERICHIA COLI  SARS Coronavirus 2 by RT PCR (hospital order, performed in [facility] hospital lab) Nasopharyngeal Nasopharyngeal Swab     Status: Abnormal   Collection Time: 03/02/20  9:56 PM   Specimen: Nasopharyngeal Swab  Result Value Ref Range Status   SARS Coronavirus 2 POSITIVE (A) NEGATIVE Final    Comment: RESULT CALLED TO, READ BACK BY AND VERIFIED WITH: RN I HODGES AT 2332 03/02/20 CRUICKSHANK A (NOTE) SARS-CoV-2 target nucleic acids are DETECTED  SARS-CoV-2 RNA is generally detectable in upper respiratory specimens  during the acute phase of infection.  Positive results are indicative  of the presence of the identified virus, but do not rule out bacterial infection or co-infection with other pathogens not detected by the test.  Clinical correlation with patient history and  other diagnostic information is necessary to determine patient infection status.  The expected result is negative.  Fact Sheet for Patients:   BoilerBrush.com.cy   Fact Sheet for Healthcare Providers:   https://pope.com/    This test is not yet approved or cleared by the [place] FDA and  has been authorized for detection and/or diagnosis of SARS-CoV-2 by FDA under an Emergency Use Authorization (EUA).  This EUA will remain in effect (meaning t his test can be used) for the duration of  the COVID-19 declaration under Section 564(b)(1) of the Act, 21 U.S.C. section 360-bbb-3(b)(1), unless the authorization is terminated or revoked sooner.  Performed at [facility], [address] [place], [place] [address]   Culture, blood (routine x 2)     Status: None (Preliminary result)   Collection Time: 03/02/20  9:57 PM   Specimen: BLOOD  Result Value Ref Range Status   Specimen Description   Final    BLOOD BLOOD RIGHT FOREARM Performed at [facility], [address], [place], [place] [address]    Special Requests   Final    BOTTLES DRAWN AEROBIC AND ANAEROBIC Blood Culture results may not be optimal due to an inadequate volume of blood received in culture bottles Performed at [facility], [address] [place], [place] [address]    Culture   Final    NO GROWTH 4 DAYS Performed at [facility], [address], [place], [place] [address]    Report Status PENDING  Incomplete  Culture, blood (routine x 2)     Status: None (Preliminary result)   Collection Time: 03/02/20  9:59 PM   Specimen: BLOOD  Result Value Ref Range Status   Specimen Description   Final    BLOOD BLOOD LEFT FOREARM Performed at [facility], [address] [place], [place] [address]    Special Requests   Final  BOTTLES DRAWN AEROBIC AND ANAEROBIC Blood Culture adequate volume Performed at [facility], [address] [place], [place] [address]    Culture   Final    NO GROWTH 4 DAYS Performed at [facility], [address], [place], [place] [address]    Report Status PENDING  Incomplete   Time coordinating discharge: Over 30 minutes  SIGNED:  Azucena Fallen, DO [facility] 03/07/2020, 12:02 PM Pager   If 7PM-7AM, please contact night-coverage [email or web address]

## 2020-03-07 NOTE — Progress Notes (Signed)
CSW spoke with [place], RN of [facility] who states this patient cannot return back to the group home this weekend due to there being no nursing staff on site. Tabitha reports she is just on call for emergency's and that since this patient is COVID positive she would require monitoring by the RN.  Edwin Dada, MSW, LCSW-A Transitions of Care  Clinical Social Worker  [facility] Emergency Departments  Medical ICU [phone]

## 2020-03-08 ENCOUNTER — Encounter (HOSPITAL_COMMUNITY): Payer: Self-pay | Admitting: Internal Medicine

## 2020-03-08 DIAGNOSIS — G9340 Encephalopathy, unspecified: Secondary | ICD-10-CM | POA: Diagnosis not present

## 2020-03-08 LAB — COMPREHENSIVE METABOLIC PANEL
ALT: 20 U/L (ref 0–44)
AST: 33 U/L (ref 15–41)
Albumin: 3.3 g/dL — ABNORMAL LOW (ref 3.5–5.0)
Alkaline Phosphatase: 47 U/L (ref 38–126)
Anion gap: 8 (ref 5–15)
BUN: 12 mg/dL (ref 8–23)
CO2: 23 mmol/L (ref 22–32)
Calcium: 8.9 mg/dL (ref 8.9–10.3)
Chloride: 108 mmol/L (ref 98–111)
Creatinine, Ser: 0.54 mg/dL (ref 0.44–1.00)
GFR calc Af Amer: >60 mL/min (ref >60)
GFR calc non Af Amer: >60 mL/min (ref >60)
Glucose, Bld: 109 mg/dL — ABNORMAL HIGH (ref 70–99)
Potassium: 4.2 mmol/L (ref 3.5–5.1)
Sodium: 139 mmol/L (ref 135–145)
Total Bilirubin: 0.7 mg/dL (ref 0.3–1.2)
Total Protein: 5.7 g/dL — ABNORMAL LOW (ref 6.5–8.1)

## 2020-03-08 LAB — CBC WITH DIFFERENTIAL/PLATELET
Abs Immature Granulocytes: 0.05 10*3/uL (ref 0.00–0.07)
Basophils Absolute: 0 10*3/uL (ref 0.0–0.1)
Basophils Relative: 0 %
Eosinophils Absolute: 0.3 10*3/uL (ref 0.0–0.5)
Eosinophils Relative: 4 %
HCT: 36.5 % (ref 36.0–46.0)
Hemoglobin: 11.8 g/dL — ABNORMAL LOW (ref 12.0–15.0)
Immature Granulocytes: 1 %
Lymphocytes Relative: 51 %
Lymphs Abs: 3.1 10*3/uL (ref 0.7–4.0)
MCH: 29.9 pg (ref 26.0–34.0)
MCHC: 32.3 g/dL (ref 30.0–36.0)
MCV: 92.6 fL (ref 80.0–100.0)
Monocytes Absolute: 0.6 10*3/uL (ref 0.1–1.0)
Monocytes Relative: 10 %
Neutro Abs: 2.1 10*3/uL (ref 1.7–7.7)
Neutrophils Relative %: 34 %
Platelets: 107 10*3/uL — ABNORMAL LOW (ref 150–400)
RBC: 3.94 MIL/uL (ref 3.87–5.11)
RDW: 13.5 % (ref 11.5–15.5)
WBC: 6.2 10*3/uL (ref 4.0–10.5)
nRBC: 0 % (ref 0.0–0.2)

## 2020-03-08 LAB — GLUCOSE, CAPILLARY: Glucose-Capillary: 138 mg/dL — ABNORMAL HIGH (ref 70–99)

## 2020-03-08 MED ORDER — AMLODIPINE BESYLATE 5 MG PO TABS
2.5000 mg | ORAL_TABLET | Freq: Every day | ORAL | Status: DC
  Administered 2020-03-08: 2.5 mg via ORAL
  Filled 2020-03-08: qty 1

## 2020-03-08 MED ORDER — MEMANTINE HCL 10 MG PO TABS
10.0000 mg | ORAL_TABLET | Freq: Two times a day (BID) | ORAL | Status: DC
  Administered 2020-03-08 – 2020-03-09 (×3): 10 mg via ORAL
  Filled 2020-03-08 (×3): qty 1

## 2020-03-08 MED ORDER — FENOFIBRATE 160 MG PO TABS
160.0000 mg | ORAL_TABLET | Freq: Every day | ORAL | Status: DC
  Administered 2020-03-08 – 2020-03-09 (×2): 160 mg via ORAL
  Filled 2020-03-08 (×2): qty 1

## 2020-03-08 MED ORDER — REPLENS VA GEL: 1.0000 | VAGINAL | Status: DC

## 2020-03-08 MED ORDER — VITAMIN D3 25 MCG (1000 UNIT) PO TABS
2000.0000 [IU] | ORAL_TABLET | Freq: Every day | ORAL | Status: DC
  Administered 2020-03-08 – 2020-03-09 (×2): 2000 [IU] via ORAL
  Filled 2020-03-08 (×2): qty 2

## 2020-03-08 MED ORDER — VITAMIN B-12 1000 MCG PO TABS
1000.0000 ug | ORAL_TABLET | Freq: Every day | ORAL | Status: DC
  Administered 2020-03-08 – 2020-03-09 (×2): 1000 ug via ORAL
  Filled 2020-03-08 (×2): qty 1

## 2020-03-08 NOTE — Discharge Summary (Signed)
Physician Discharge Summary  Sherri Gross:[id number] DOB: July 20, 1946 DOA: 03/02/2020  PCP: Lucretia Field  Admit date: 03/02/2020 Discharge date: 03/08/2020  Admitted From: Group home Disposition: Group home  Recommendations for Outpatient Follow-up:  1. 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 Patient was reportedly difficult to wake on 03/01/2020, eventually woke up but has been lethargic. Patient is unable to contribute much of anything to the history and her contacts cannot be reached at the phone numbers listed.Upon arrival to the ED, patient is found to be afebrile, saturating upper 90s on room air, and with normal heart and respiratory rates and stable blood pressure. Chemistry panel is notable for an elevated BUN to creatinine ratio, CBC is unremarkable, urinalysis is nitrite positive, and Covid PCR is positive. Patient received dose #1 on 08/02/2019 and dose #2 on 08/30/2019 for COVID-19 vaccine. Hospitalistswere asked to admit.  Patient presented with altered mental status from baseline, tested positive for COVID-19 despite 2 doses of vaccine but remained without hypoxia or respiratory symptoms, patient's urine culture was positive for ESBL E. coli and likely the cause of her mental status changes.  Covid positive testing was incidental finding.  At this time patient has completed meropenem per discussion with pharmacy and otherwise back to baseline stable and agreeable for discharge home.  Awaiting transport to facility.  Patient remains medically stable for discharge with no new medications or  need for ongoing monitoring as she has resolved from her simple UTI and is back to baseline - facility unable/unwilling to take the patient back until 03/09/20.  Discharge Diagnoses:  Principal Problem:   Acute encephalopathy Active Problems:   Dementia (HCC)   Intellectual disability   COVID-19   Pneumonia    Discharge Instructions  Discharge Instructions    Diet - low sodium heart healthy   Complete by: As directed    Increase activity slowly   Complete by: As directed      Allergies as of 03/08/2020      Reactions   Galantamine    Unknown per mar   Tuberculin Ppd    unknown      Medication List    TAKE these medications   amLODipine 2.5 MG tablet Commonly known as: NORVASC Take 2.5 mg by mouth at bedtime.   fenofibrate 145 MG tablet Commonly known as: TRICOR Take 145 mg by mouth daily.   memantine 10 MG tablet Commonly known as: NAMENDA Take 10 mg by mouth 2 (two) times daily.   Replens Gel Place 1 applicator vaginally once a week. Every Mon   tizanidine 2 MG capsule Commonly known as: ZANAFLEX Take 2 mg by mouth as directed. Take on MWF & Sat   vitamin B-12 1000 MCG tablet Commonly known as: CYANOCOBALAMIN Take 1,000 mcg by mouth daily.   Vitamin D3 50 MCG (2000 UT) Tabs Take 2 capsules by mouth daily.       Allergies  Allergen Reactions  . Galantamine     Unknown per mar  . Tuberculin Ppd     unknown    Consultations: None  Procedures/Studies: DG Chest 2 View  Result Date: 03/02/2020 CLINICAL DATA:  Rhonchi. EXAM: CHEST -  2 VIEW COMPARISON:  June 06, 2014 FINDINGS: There is an airspace opacity in the right middle lobe. There is no pneumothorax. No large pleural effusion. The heart size is normal. Aortic calcifications are noted. There are multiple lucencies overlying the patient's hepatic dome which are favored to represent interposed loops of bowel. There is osteopenia without evidence for an acute displaced compression fracture.  IMPRESSION: Right middle lobe airspace opacity compatible with pneumonia. Follow-up to radiologic resolution is recommended. Electronically Signed   By: Katherine Mantle M.D.   On: 03/02/2020 18:42     Subjective: Review of systems severely limited given patient's baseline mental status but no acute issues or events noted overnight by staffing.   Discharge Exam: Vitals:   03/07/20 2014 03/08/20 0649  BP: (!) 157/90 (!) 135/57  Pulse: 75 79  Resp: 19 20  Temp: 99.4 F (37.4 C) 99.2 F (37.3 C)  SpO2: 96% 96%   Vitals:   03/07/20 0439 03/07/20 0900 03/07/20 2014 03/08/20 0649  BP: 140/87  (!) 157/90 (!) 135/57  Pulse: 73  75 79  Resp: 18  19 20   Temp: 100.1 F (37.8 C)  99.4 F (37.4 C) 99.2 F (37.3 C)  TempSrc: Oral  Oral Oral  SpO2: 94% 93% 96% 96%  Weight:      Height:        General: Pt is alert, awake, not in acute distress, tracks movements, follows simple commands, tolerating p.o. safely. Cardiovascular: RRR, S1/S2 +, no rubs, no gallops Respiratory: CTA bilaterally, no wheezing, no rhonchi Abdominal: Soft, NT, ND, bowel sounds + Extremities: no edema, no cyanosis    The results of significant diagnostics from this hospitalization (including imaging, microbiology, ancillary and laboratory) are listed below for reference.     Microbiology: Recent Results (from the past 240 hour(s))  Urine culture     Status: Abnormal   Collection Time: 03/02/20  8:04 PM   Specimen: Urine, Clean Catch  Result Value Ref Range Status   Specimen Description   Final    URINE, CLEAN CATCH Performed at [facility], [address], [place], [place]    Special Requests   Final    NONE Performed at [facility], [address], [place], [place]    Culture (A)  Final    >=100,000 COLONIES/mL ESCHERICHIA COLI Confirmed Extended Spectrum Beta-Lactamase Producer (ESBL).  In bloodstream infections from ESBL organisms,  carbapenems are preferred over piperacillin/tazobactam. They are shown to have a lower risk of mortality.    Report Status 03/04/2020 FINAL  Final   Organism ID, Bacteria ESCHERICHIA COLI (A)  Final      Susceptibility   Escherichia coli - MIC*    AMPICILLIN >=32 RESISTANT Resistant     CEFAZOLIN >=64 RESISTANT Resistant     CEFTRIAXONE >=64 RESISTANT Resistant     CIPROFLOXACIN >=4 RESISTANT Resistant     GENTAMICIN <=1 SENSITIVE Sensitive     IMIPENEM <=0.25 SENSITIVE Sensitive     NITROFURANTOIN <=16 SENSITIVE Sensitive     TRIMETH/SULFA >=320 RESISTANT Resistant     AMPICILLIN/SULBACTAM 4 SENSITIVE Sensitive     PIP/TAZO <=4 SENSITIVE Sensitive     * >=100,000 COLONIES/mL ESCHERICHIA COLI  SARS Coronavirus 2 by RT PCR (hospital order, performed in [facility] hospital lab) Nasopharyngeal Nasopharyngeal Swab     Status: Abnormal   Collection Time: 03/02/20  9:56 PM   Specimen: Nasopharyngeal Swab  Result Value Ref Range Status   SARS Coronavirus  2 POSITIVE (A) NEGATIVE Final    Comment: RESULT CALLED TO, READ BACK BY AND VERIFIED WITH: RN I HODGES AT 2332 03/02/20 CRUICKSHANK A (NOTE) SARS-CoV-2 target nucleic acids are DETECTED  SARS-CoV-2 RNA is generally detectable in upper respiratory specimens  during the acute phase of infection.  Positive results are indicative  of the presence of the identified virus, but do not rule out bacterial infection or co-infection with other pathogens not detected by the test.  Clinical correlation with patient history and  other diagnostic information is necessary to determine patient infection status.  The expected result is negative.  Fact Sheet for Patients:   BoilerBrush.com.cy   Fact Sheet for Healthcare Providers:   https://pope.com/    This test is not yet approved or cleared by the [place] FDA and  has been authorized for detection and/or diagnosis of SARS-CoV-2 by FDA under an  Emergency Use Authorization (EUA).  This EUA will remain in effect (meaning t his test can be used) for the duration of  the COVID-19 declaration under Section 564(b)(1) of the Act, 21 U.S.C. section 360-bbb-3(b)(1), unless the authorization is terminated or revoked sooner.  Performed at [facility], [address] [place], [place] [address]   Culture, blood (routine x 2)     Status: None   Collection Time: 03/02/20  9:57 PM   Specimen: BLOOD  Result Value Ref Range Status   Specimen Description   Final    BLOOD BLOOD RIGHT FOREARM Performed at [facility], [address], [place], [place] [address]    Special Requests   Final    BOTTLES DRAWN AEROBIC AND ANAEROBIC Blood Culture results may not be optimal due to an inadequate volume of blood received in culture bottles Performed at [facility], [address] [place], [place] [address]    Culture   Final    NO GROWTH 5 DAYS Performed at [facility], [address] [place], [place] [address]    Report Status 03/07/2020 FINAL  Final  Culture, blood (routine x 2)     Status: None   Collection Time: 03/02/20  9:59 PM   Specimen: BLOOD  Result Value Ref Range Status   Specimen Description   Final    BLOOD BLOOD LEFT FOREARM Performed at [facility], [address] [place], [place] [address]    Special Requests   Final    BOTTLES DRAWN AEROBIC AND ANAEROBIC Blood Culture adequate volume Performed at [facility], [address] [place], [place] [address]    Culture   Final    NO GROWTH 5 DAYS Performed at [facility], [address], [place], [place] [address]    Report Status 03/07/2020 FINAL  Final     Labs: BNP (last 3 results) No results for input(s): BNP in the last 8760 hours. Basic Metabolic Panel: Recent Labs  Lab 03/02/20 2156 03/03/20 0531 03/04/20 0341 03/05/20 0425 03/06/20 0447  03/07/20 0419 03/08/20 0523  NA   < >  --  141 138 139 132* 139  K   < >  --  3.1* 3.2* 3.6 3.9 4.2  CL   < >  --  105 105 108 104 108  CO2   < >  --  22 25 25 22 23   GLUCOSE   < >  --  78 98 108* 111* 109*  BUN   < >  --  15  12 11 13 12   CREATININE   < >  --  0.53 0.51 0.53 0.49 0.54  CALCIUM   < >  --  8.4* 8.2* 8.7* 8.0* 8.9  MG  --  1.9  --   --   --   --   --    < > = values in this interval not displayed.   Liver Function Tests: Recent Labs  Lab 03/04/20 0341 03/05/20 0425 03/06/20 0447 03/07/20 0419 03/08/20 0523  AST 25 24 24 29  33  ALT 17 14 16 15 20   ALKPHOS 38 39 39 43 47  BILITOT 0.8 0.6 0.4 0.4 0.7  PROT 6.0* 5.3* 5.3* 4.9* 5.7*  ALBUMIN 3.2* 3.0* 2.9* 2.7* 3.3*   No results for input(s): LIPASE, AMYLASE in the last 168 hours. No results for input(s): AMMONIA in the last 168 hours. CBC: Recent Labs  Lab 03/04/20 0341 03/05/20 0425 03/06/20 0447 03/07/20 0419 03/08/20 0523  WBC 4.7 4.3 3.9* 4.2 6.2  NEUTROABS 2.0 1.8 1.5* 1.9 2.1  HGB 12.2 11.4* 11.5* 11.1* 11.8*  HCT 38.7 34.4* 35.1* 34.2* 36.5  MCV 94.9 90.5 90.5 91.9 92.6  PLT 153 139* 105* 89* 107*   Cardiac Enzymes: No results for input(s): CKTOTAL, CKMB, CKMBINDEX, TROPONINI in the last 168 hours. BNP: Invalid input(s): POCBNP CBG: Recent Labs  Lab 03/08/20 0643  GLUCAP 138*   D-Dimer Recent Labs    03/06/20 0447  DDIMER 0.68*   Hgb A1c No results for input(s): HGBA1C in the last 72 hours. Lipid Profile No results for input(s): CHOL, HDL, LDLCALC, TRIG, CHOLHDL, LDLDIRECT in the last 72 hours. Thyroid function studies No results for input(s): TSH, T4TOTAL, T3FREE, THYROIDAB in the last 72 hours.  Invalid input(s): FREET3 Anemia work up No results for input(s): VITAMINB12, FOLATE, FERRITIN, TIBC, IRON, RETICCTPCT in the last 72 hours. Urinalysis    Component Value Date/Time   COLORURINE YELLOW 03/02/2020 2004   APPEARANCEUR CLEAR 03/02/2020 2004   LABSPEC 1.021 03/02/2020  2004   PHURINE 6.0 03/02/2020 2004   GLUCOSEU NEGATIVE 03/02/2020 2004   HGBUR NEGATIVE 03/02/2020 2004   BILIRUBINUR NEGATIVE 03/02/2020 2004   KETONESUR NEGATIVE 03/02/2020 2004   PROTEINUR 30 (A) 03/02/2020 2004   UROBILINOGEN 1.0 01/17/2014 1616   NITRITE POSITIVE (A) 03/02/2020 2004   LEUKOCYTESUR NEGATIVE 03/02/2020 2004   Sepsis Labs Invalid input(s): PROCALCITONIN,  WBC,  LACTICIDVEN Microbiology Recent Results (from the past 240 hour(s))  Urine culture     Status: Abnormal   Collection Time: 03/02/20  8:04 PM   Specimen: Urine, Clean Catch  Result Value Ref Range Status   Specimen Description   Final    URINE, CLEAN CATCH Performed at [facility], [address] [place], [place] [address]    Special Requests   Final    NONE Performed at [facility], [address] [place], [place] [address]    Culture (A)  Final    >=100,000 COLONIES/mL ESCHERICHIA COLI Confirmed Extended Spectrum Beta-Lactamase Producer (ESBL).  In bloodstream infections from ESBL organisms, carbapenems are preferred over piperacillin/tazobactam. They are shown to have a lower risk of mortality.    Report Status 03/04/2020 FINAL  Final   Organism ID, Bacteria ESCHERICHIA COLI (A)  Final      Susceptibility   Escherichia coli - MIC*    AMPICILLIN >=32 RESISTANT Resistant     CEFAZOLIN >=64 RESISTANT Resistant     CEFTRIAXONE >=64 RESISTANT Resistant     CIPROFLOXACIN >=  4 RESISTANT Resistant     GENTAMICIN <=1 SENSITIVE Sensitive     IMIPENEM <=0.25 SENSITIVE Sensitive     NITROFURANTOIN <=16 SENSITIVE Sensitive     TRIMETH/SULFA >=320 RESISTANT Resistant     AMPICILLIN/SULBACTAM 4 SENSITIVE Sensitive     PIP/TAZO <=4 SENSITIVE Sensitive     * >=100,000 COLONIES/mL ESCHERICHIA COLI  SARS Coronavirus 2 by RT PCR (hospital order, performed in [facility] hospital lab) Nasopharyngeal Nasopharyngeal Swab     Status: Abnormal   Collection Time: 03/02/20  9:56  PM   Specimen: Nasopharyngeal Swab  Result Value Ref Range Status   SARS Coronavirus 2 POSITIVE (A) NEGATIVE Final    Comment: RESULT CALLED TO, READ BACK BY AND VERIFIED WITH: RN I HODGES AT 2332 03/02/20 CRUICKSHANK A (NOTE) SARS-CoV-2 target nucleic acids are DETECTED  SARS-CoV-2 RNA is generally detectable in upper respiratory specimens  during the acute phase of infection.  Positive results are indicative  of the presence of the identified virus, but do not rule out bacterial infection or co-infection with other pathogens not detected by the test.  Clinical correlation with patient history and  other diagnostic information is necessary to determine patient infection status.  The expected result is negative.  Fact Sheet for Patients:   BoilerBrush.com.cy   Fact Sheet for Healthcare Providers:   https://pope.com/    This test is not yet approved or cleared by the [place] FDA and  has been authorized for detection and/or diagnosis of SARS-CoV-2 by FDA under an Emergency Use Authorization (EUA).  This EUA will remain in effect (meaning t his test can be used) for the duration of  the COVID-19 declaration under Section 564(b)(1) of the Act, 21 U.S.C. section 360-bbb-3(b)(1), unless the authorization is terminated or revoked sooner.  Performed at [facility], [address], [place], [place] [address]   Culture, blood (routine x 2)     Status: None   Collection Time: 03/02/20  9:57 PM   Specimen: BLOOD  Result Value Ref Range Status   Specimen Description   Final    BLOOD BLOOD RIGHT FOREARM Performed at [facility], [address] [place], [place] [address]    Special Requests   Final    BOTTLES DRAWN AEROBIC AND ANAEROBIC Blood Culture results may not be optimal due to an inadequate volume of blood received in culture bottles Performed at [facility], [address] [place], [place] [address]    Culture   Final    NO GROWTH 5 DAYS Performed at [facility], [address], [place], [place] [address]    Report Status 03/07/2020 FINAL  Final  Culture, blood (routine x 2)     Status: None   Collection Time: 03/02/20  9:59 PM   Specimen: BLOOD  Result Value Ref Range Status   Specimen Description   Final    BLOOD BLOOD LEFT FOREARM Performed at [facility], [address] [place], [place] [address]    Special Requests   Final    BOTTLES DRAWN AEROBIC AND ANAEROBIC Blood Culture adequate volume Performed at [facility], [address] [place], [place] [address]    Culture   Final    NO GROWTH 5 DAYS Performed at [facility], [address] [place], [place] [address]    Report Status 03/07/2020 FINAL  Final   Time coordinating discharge: Over 30 minutes  SIGNED:  Kristine Garbe  Natale Milch, DO [facility] 03/08/2020, 7:54 AM Pager   If 7PM-7AM, please contact night-coverage [email or web address]

## 2020-03-08 NOTE — Social Work (Signed)
TOC CSW spoke with Jennifer/[facility].  Victorino Dike stated that [facility] is not equipped to quarantine pt.  Pt can be transported on Monday, 03/09/2020 when Deneen Harts, QP is in.  Call [phone] for Rozann Lesches, RN for pt to report.  CSW will email AVS and MAR via secure email to [email or web address].  No FL2 is needed.  CSW will continue to follow for dc needs.  Marios Gaiser Tarpley-Carter, MSW, LCSW-A                  [facility] Transitions of CareClinical Social Worker [email or web address] [phone]

## 2020-03-09 ENCOUNTER — Encounter (HOSPITAL_COMMUNITY): Payer: Self-pay | Admitting: Internal Medicine

## 2020-03-09 NOTE — Discharge Summary (Signed)
Physician Discharge Summary  Sherri Gross QZE:[id number] DOB: [birth year] DOA: 03/02/2020  PCP: Lucretia Field  Admit date: 03/02/2020 Discharge date: 03/09/2020  Admitted From: Group home Disposition: Group home  Recommendations for Outpatient Follow-up:  1. 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 Patient was reportedly difficult to wake on 03/01/2020, eventually woke up but has been lethargic. Patient is unable to contribute much of anything to the history and her contacts cannot be reached at the phone numbers listed.Upon arrival to the ED, patient is found to be afebrile, saturating upper 90s on room air, and with normal heart and respiratory rates and stable blood pressure. Chemistry panel is notable for an elevated BUN to creatinine ratio, CBC is unremarkable, urinalysis is nitrite positive, and Covid PCR is positive. Patient received dose #1 on 08/02/2019 and dose #2 on 08/30/2019 for COVID-19 vaccine. Hospitalistswere asked to admit.  Patient presented with altered mental status from baseline, tested positive for COVID-19 despite 2 doses of vaccine but remained without hypoxia or respiratory symptoms, patient's urine culture was positive for ESBL E. coli and likely the cause of her mental status changes.  Covid positive testing was incidental finding.  At this time patient has completed meropenem per discussion with pharmacy and otherwise back to baseline stable and agreeable for discharge home.  Awaiting transport to facility.  Patient remains medically stable for discharge with no new medications or  need for ongoing monitoring as she has resolved from her simple UTI and is back to baseline - facility unable/unwilling to take the patient back until 03/09/20.  Discharge Diagnoses:  Principal Problem:   Acute encephalopathy Active Problems:   Dementia (HCC)   Intellectual disability   COVID-19   Pneumonia    Discharge Instructions  Discharge Instructions    Diet - low sodium heart healthy   Complete by: As directed    Increase activity slowly   Complete by: As directed      Allergies as of 03/09/2020      Reactions   Galantamine    Unknown per mar   Tuberculin Ppd    unknown      Medication List    TAKE these medications   amLODipine 2.5 MG tablet Commonly known as: NORVASC Take 2.5 mg by mouth at bedtime.   fenofibrate 145 MG tablet Commonly known as: TRICOR Take 145 mg by mouth daily.   memantine 10 MG tablet Commonly known as: NAMENDA Take 10 mg by mouth 2 (two) times daily.   Replens Gel Place 1 applicator vaginally once a week. Every Mon   tizanidine 2 MG capsule Commonly known as: ZANAFLEX Take 2 mg by mouth as directed. Take on MWF & Sat   vitamin B-12 1000 MCG tablet Commonly known as: CYANOCOBALAMIN Take 1,000 mcg by mouth daily.   Vitamin D3 50 MCG (2000 UT) Tabs Take 2 capsules by mouth daily.       Allergies  Allergen Reactions  . Galantamine     Unknown per mar  . Tuberculin Ppd     unknown    Consultations: None  Procedures/Studies: DG Chest 2 View  Result Date: 03/02/2020 CLINICAL DATA:  Rhonchi. EXAM: CHEST -  2 VIEW COMPARISON:  June 06, 2014 FINDINGS: There is an airspace opacity in the right middle lobe. There is no pneumothorax. No large pleural effusion. The heart size is normal. Aortic calcifications are noted. There are multiple lucencies overlying the patient's hepatic dome which are favored to represent interposed loops of bowel. There is osteopenia without evidence for an acute displaced compression fracture.  IMPRESSION: Right middle lobe airspace opacity compatible with pneumonia. Follow-up to radiologic resolution is recommended. Electronically Signed   By: Katherine Mantle M.D.   On: 03/02/2020 18:42     Subjective: Review of systems severely limited given patient's baseline mental status but no acute issues or events noted overnight by staffing.   Discharge Exam: Vitals:   03/08/20 2039 03/09/20 0636  BP: (!) 144/76 (!) 144/76  Pulse: 73 72  Resp: 16 19  Temp: 98 F (36.7 C) 98.1 F (36.7 C)  SpO2: 99% 99%   Vitals:   03/08/20 0649 03/08/20 1249 03/08/20 2039 03/09/20 0636  BP: (!) 135/57 131/83 (!) 144/76 (!) 144/76  Pulse: 79 74 73 72  Resp: Temp: 99.2 F (37.3 C) 97.7 F (36.5 C) 98 F (36.7 C) 98.1 F (36.7 C)  TempSrc: Oral Oral Oral   SpO2: 96% 97% 99% 99%  Weight:    55 kg  Height:         The results of significant diagnostics from this hospitalization (including imaging, microbiology, ancillary and laboratory) are listed below for reference.     Microbiology: Recent Results (from the past 240 hour(s))  Urine culture     Status: Abnormal   Collection Time: 03/02/20  8:04 PM   Specimen: Urine, Clean Catch  Result Value Ref Range Status   Specimen Description   Final    URINE, CLEAN CATCH Performed at [facility], [address] [place], [place] [address]    Special Requests   Final    NONE Performed at [facility], [address], [place], [place] [address]    Culture (A)  Final    >=100,000 COLONIES/mL ESCHERICHIA COLI Confirmed Extended Spectrum Beta-Lactamase Producer (ESBL).  In bloodstream infections from ESBL organisms, carbapenems are preferred over piperacillin/tazobactam. They are shown to have a lower risk of mortality.    Report Status 03/04/2020 FINAL  Final   Organism ID, Bacteria ESCHERICHIA COLI (A)  Final      Susceptibility   Escherichia coli - MIC*    AMPICILLIN >=32 RESISTANT  Resistant     CEFAZOLIN >=64 RESISTANT Resistant     CEFTRIAXONE >=64 RESISTANT Resistant     CIPROFLOXACIN >=4 RESISTANT Resistant     GENTAMICIN <=1 SENSITIVE Sensitive     IMIPENEM <=0.25 SENSITIVE Sensitive     NITROFURANTOIN <=16 SENSITIVE Sensitive     TRIMETH/SULFA >=320 RESISTANT Resistant     AMPICILLIN/SULBACTAM 4 SENSITIVE Sensitive     PIP/TAZO <=4 SENSITIVE Sensitive     * >=100,000 COLONIES/mL ESCHERICHIA COLI  SARS Coronavirus 2 by RT PCR (hospital order, performed in [facility] hospital lab) Nasopharyngeal Nasopharyngeal Swab     Status: Abnormal   Collection Time: 03/02/20  9:56 PM   Specimen: Nasopharyngeal Swab  Result Value Ref Range Status   SARS Coronavirus 2 POSITIVE (A) NEGATIVE Final    Comment: RESULT CALLED TO, READ BACK BY AND VERIFIED WITH: RN I HODGES AT 2332 03/02/20 CRUICKSHANK A (NOTE) SARS-CoV-2 target nucleic acids are DETECTED  SARS-CoV-2 RNA is generally detectable in upper  respiratory specimens  during the acute phase of infection.  Positive results are indicative  of the presence of the identified virus, but do not rule out bacterial infection or co-infection with other pathogens not detected by the test.  Clinical correlation with patient history and  other diagnostic information is necessary to determine patient infection status.  The expected result is negative.  Fact Sheet for Patients:   BoilerBrush.com.cyhttps://www.fda.gov/media/136312/download   Fact Sheet for Healthcare Providers:   https://pope.com/https://www.fda.gov/media/136313/download    This test is not yet approved or cleared by the [place] FDA and  has been authorized for detection and/or diagnosis of SARS-CoV-2 by FDA under an Emergency Use Authorization (EUA).  This EUA will remain in effect (meaning t his test can be used) for the duration of  the COVID-19 declaration under Section 564(b)(1) of the Act, 21 U.S.C. section 360-bbb-3(b)(1), unless the authorization is terminated or revoked  sooner.  Performed at [facility], [address] [place], [place] [address]   Culture, blood (routine x 2)     Status: None   Collection Time: 03/02/20  9:57 PM   Specimen: BLOOD  Result Value Ref Range Status   Specimen Description   Final    BLOOD BLOOD RIGHT FOREARM Performed at [facility], [address] [place], [place] [address]    Special Requests   Final    BOTTLES DRAWN AEROBIC AND ANAEROBIC Blood Culture results may not be optimal due to an inadequate volume of blood received in culture bottles Performed at [facility], [address] [place], [place] [address]    Culture   Final    NO GROWTH 5 DAYS Performed at [facility], [address] [place], [place] [address]    Report Status 03/07/2020 FINAL  Final  Culture, blood (routine x 2)     Status: None   Collection Time: 03/02/20  9:59 PM   Specimen: BLOOD  Result Value Ref Range Status   Specimen Description   Final    BLOOD BLOOD LEFT FOREARM Performed at [facility], [address] [place], [place] [address]    Special Requests   Final    BOTTLES DRAWN AEROBIC AND ANAEROBIC Blood Culture adequate volume Performed at [facility], [address] [place], [place] [address]    Culture   Final    NO GROWTH 5 DAYS Performed at [facility], [address] [place], [place] [address]    Report Status 03/07/2020 FINAL  Final     Labs: BNP (last 3 results) No results for input(s): BNP in the last 8760 hours. Basic Metabolic Panel: Recent Labs  Lab 03/02/20 2156 03/03/20 0531 03/04/20 0341 03/05/20 0425 03/06/20 0447 03/07/20 0419 03/08/20 0523  NA   < >  --  141 138 139 132* 139  K   < >  --  3.1* 3.2* 3.6 3.9 4.2  CL   < >  --  105 105 108 104 108  CO2   < >  --  22 25 25 22 23   GLUCOSE   < >  --  78 98 108* 111* 109*  BUN   < >  --  15 12 11 13 12   CREATININE   < >  --  0.53 0.51  0.53 0.49 0.54  CALCIUM   < >  --  8.4* 8.2* 8.7* 8.0* 8.9  MG  --  1.9  --   --   --   --   --    < > =  values in this interval not displayed.   Liver Function Tests: Recent Labs  Lab 03/04/20 0341 03/05/20 0425 03/06/20 0447 03/07/20 0419 03/08/20 0523  AST 25 24 24 29  33  ALT 17 14 16 15 20   ALKPHOS 38 39 39 43 47  BILITOT 0.8 0.6 0.4 0.4 0.7  PROT 6.0* 5.3* 5.3* 4.9* 5.7*  ALBUMIN 3.2* 3.0* 2.9* 2.7* 3.3*   No results for input(s): LIPASE, AMYLASE in the last 168 hours. No results for input(s): AMMONIA in the last 168 hours. CBC: Recent Labs  Lab 03/04/20 0341 03/05/20 0425 03/06/20 0447 03/07/20 0419 03/08/20 0523  WBC 4.7 4.3 3.9* 4.2 6.2  NEUTROABS 2.0 1.8 1.5* 1.9 2.1  HGB 12.2 11.4* 11.5* 11.1* 11.8*  HCT 38.7 34.4* 35.1* 34.2* 36.5  MCV 94.9 90.5 90.5 91.9 92.6  PLT 153 139* 105* 89* 107*   Cardiac Enzymes: No results for input(s): CKTOTAL, CKMB, CKMBINDEX, TROPONINI in the last 168 hours. BNP: Invalid input(s): POCBNP CBG: Recent Labs  Lab 03/08/20 0643  GLUCAP 138*   D-Dimer No results for input(s): DDIMER in the last 72 hours. Hgb A1c No results for input(s): HGBA1C in the last 72 hours. Lipid Profile No results for input(s): CHOL, HDL, LDLCALC, TRIG, CHOLHDL, LDLDIRECT in the last 72 hours. Thyroid function studies No results for input(s): TSH, T4TOTAL, T3FREE, THYROIDAB in the last 72 hours.  Invalid input(s): FREET3 Anemia work up No results for input(s): VITAMINB12, FOLATE, FERRITIN, TIBC, IRON, RETICCTPCT in the last 72 hours. Urinalysis    Component Value Date/Time   COLORURINE YELLOW 03/02/2020 2004   APPEARANCEUR CLEAR 03/02/2020 2004   LABSPEC 1.021 03/02/2020 2004   PHURINE 6.0 03/02/2020 2004   GLUCOSEU NEGATIVE 03/02/2020 2004   HGBUR NEGATIVE 03/02/2020 2004   BILIRUBINUR NEGATIVE 03/02/2020 2004   KETONESUR NEGATIVE 03/02/2020 2004   PROTEINUR 30 (A) 03/02/2020 2004   UROBILINOGEN 1.0 01/17/2014 1616   NITRITE POSITIVE  (A) 03/02/2020 2004   LEUKOCYTESUR NEGATIVE 03/02/2020 2004   Sepsis Labs Invalid input(s): PROCALCITONIN,  WBC,  LACTICIDVEN Microbiology Recent Results (from the past 240 hour(s))  Urine culture     Status: Abnormal   Collection Time: 03/02/20  8:04 PM   Specimen: Urine, Clean Catch  Result Value Ref Range Status   Specimen Description   Final    URINE, CLEAN CATCH Performed at [facility], [address] [place], [place]    Special Requests   Final    NONE Performed at [facility], [address], [place], [place]    Culture (A)  Final    >=100,000 COLONIES/mL ESCHERICHIA COLI Confirmed Extended Spectrum Beta-Lactamase Producer (ESBL).  In bloodstream infections from ESBL organisms, carbapenems are preferred over piperacillin/tazobactam. They are shown to have a lower risk of mortality.    Report Status 03/04/2020 FINAL  Final   Organism ID, Bacteria ESCHERICHIA COLI (A)  Final      Susceptibility   Escherichia coli - MIC*    AMPICILLIN >=32 RESISTANT Resistant     CEFAZOLIN >=64 RESISTANT Resistant     CEFTRIAXONE >=64 RESISTANT Resistant     CIPROFLOXACIN >=4 RESISTANT Resistant     GENTAMICIN <=1 SENSITIVE Sensitive     IMIPENEM <=0.25 SENSITIVE Sensitive     NITROFURANTOIN <=16 SENSITIVE Sensitive     TRIMETH/SULFA >=320 RESISTANT Resistant     AMPICILLIN/SULBACTAM 4 SENSITIVE Sensitive     PIP/TAZO <=4 SENSITIVE Sensitive     * >=100,000 COLONIES/mL ESCHERICHIA COLI  SARS Coronavirus 2 by RT PCR (hospital order, performed in [facility] hospital lab) Nasopharyngeal Nasopharyngeal Swab     Status: Abnormal   Collection Time: 03/02/20  9:56 PM   Specimen: Nasopharyngeal Swab  Result Value Ref Range Status   SARS Coronavirus 2 POSITIVE (A) NEGATIVE Final    Comment: RESULT CALLED TO, READ BACK BY AND VERIFIED WITH: RN I HODGES AT 2332 03/02/20 CRUICKSHANK A (NOTE) SARS-CoV-2 target nucleic acids are  DETECTED  SARS-CoV-2 RNA is generally detectable in upper respiratory specimens  during the acute phase of infection.  Positive results are indicative  of the presence of the identified virus, but do not rule out bacterial infection or co-infection with other pathogens not detected by the test.  Clinical correlation with patient history and  other diagnostic information is necessary to determine patient infection status.  The expected result is negative.  Fact Sheet for Patients:   BoilerBrush.com.cy   Fact Sheet for Healthcare Providers:   https://pope.com/    This test is not yet approved or cleared by the [place] FDA and  has been authorized for detection and/or diagnosis of SARS-CoV-2 by FDA under an Emergency Use Authorization (EUA).  This EUA will remain in effect (meaning t his test can be used) for the duration of  the COVID-19 declaration under Section 564(b)(1) of the Act, 21 U.S.C. section 360-bbb-3(b)(1), unless the authorization is terminated or revoked sooner.  Performed at [facility], [address], [place], [place] [address]   Culture, blood (routine x 2)     Status: None   Collection Time: 03/02/20  9:57 PM   Specimen: BLOOD  Result Value Ref Range Status   Specimen Description   Final    BLOOD BLOOD RIGHT FOREARM Performed at [facility], [address] [place], [place] [address]    Special Requests   Final    BOTTLES DRAWN AEROBIC AND ANAEROBIC Blood Culture results may not be optimal due to an inadequate volume of blood received in culture bottles Performed at [facility], [address], [place], [place] [address]    Culture   Final    NO GROWTH 5 DAYS Performed at [facility], [address] [place], [place] [address]    Report Status 03/07/2020 FINAL  Final  Culture, blood (routine x 2)     Status: None   Collection Time:  03/02/20  9:59 PM   Specimen: BLOOD  Result Value Ref Range Status   Specimen Description   Final    BLOOD BLOOD LEFT FOREARM Performed at [facility], [address] [place], [place] [address]    Special Requests   Final    BOTTLES DRAWN AEROBIC AND ANAEROBIC Blood Culture adequate volume Performed at [facility], [address], [place], [place] [address]    Culture   Final    NO GROWTH 5 DAYS Performed at [facility], [address], [place], [place] [address]    Report Status 03/07/2020 FINAL  Final   Time coordinating discharge: Over 30 minutes  SIGNED:  Azucena Fallen, DO [facility] 03/09/2020, 7:36 AM Pager   If 7PM-7AM, please contact night-coverage [email or web address]

## 2020-03-09 NOTE — TOC Progression Note (Addendum)
Transition of Care [facility]) - Progression Note    Patient Details  Name: RAKEISHA NYCE MRN: [id number] Date of Birth: August 29, 1945  Transition of [facility]) CM/SW Contact  Darleene Cleaver, [place] Phone Number: 03/09/2020, 11:03 AM  Clinical Narrative:     CSW attempted to contact [facility] group home nurse, left a message awaiting for a call back to set up transfer back to group home.  11:56 am  CSW attempted to contact [facility] group home nurse, and was not able to leave a message in voice mail due to box being full.  12:10pm CSW spoke to Zannie Kehr, RN at [employer], she said patient can return today anytime.  CSW to set up EMS transport.  Expected Discharge Plan: Group Home Barriers to Discharge: Other (comment), Continued Medical Work up (Group Home can not accept back until Monday.)  Expected Discharge Plan and Services Expected Discharge Plan: Group Home In-house Referral: NA   Post Acute Care Choice: NA Living arrangements for the past 2 months: Group Home Expected Discharge Date: 03/09/20                 DME Agency: NA       HH Arranged: NA           Social Determinants of Health (SDOH) Interventions    Readmission Risk Interventions No flowsheet data found.

## 2020-03-09 NOTE — TOC Transition Note (Addendum)
Transition of Care [facility]) - CM/SW Discharge Note   Patient Details  Name: Sherri Gross MRN: [id number] Date of Birth: 12/20/1945  [facility]) CM/SW Contact:  Darleene Cleaver, LCSW Phone Number: 03/09/2020, 12:31 PM   Clinical Narrative:     CSW spoke to [place] on call RN at the group home.  She said patient can return today, and will need EMS transport.  Patient to be d/c'ed today to [facility] [address] [place], [place], [address].  Patient and family agreeable to plans will transport via ems RN to call report to Tamika at [phone].   Final next level of care: Group Home Barriers to Discharge: Barriers Resolved   Patient Goals and CMS Choice Patient states their goals for this hospitalization and ongoing recovery are:: To return back to the [facility] group home. CMS Medicare.gov Compare Post Acute Care list provided to:: Patient Represenative (must comment) Choice offered to / list presented to :  ([facility] on call nurse.)  Discharge Placement  Back to her group home.                Name of family member notified: [facility] on call nurse Zannie Kehr, RN Patient and family notified of of transfer: 03/09/20  Discharge Plan and Services In-house Referral: NA   Post Acute Care Choice: NA            DME Agency: NA       HH Arranged: NA          Social Determinants of Health (SDOH) Interventions     Readmission Risk Interventions No flowsheet data found.

## 2020-03-09 NOTE — [place] FL2 (Addendum)
[place] MEDICAID FL2 LEVEL OF CARE SCREENING TOOL     IDENTIFICATION  Patient Name: Sherri Gross Birthdate: 02/16/1946 Sex: female Admission Date (Current Location): 03/02/2020  [place] and [place] Number:  Haynes Bast [id number] K Facility and Address:  [facility],  [address], [place] [address]      Provider Number: [id number]  Attending Physician Name and Address:  Azucena Fallen, MD  Relative Name and Phone Number:  [facility],  Nurse Other   [phone] and Redd,[name] Sister   [phone]    Current Level of Care: Hospital Recommended Level of Care: Family Care Home ([facility]) Prior Approval Number:    Date Approved/Denied:   PASRR Number:    Discharge Plan: Other (Comment) ([facility] group home)    Current Diagnoses: Patient Active Problem List   Diagnosis Date Noted  . Acute encephalopathy 03/03/2020  . COVID-19 03/03/2020  . Pneumonia 03/03/2020  . Memory loss 07/27/2016  . Left spastic hemiparesis (HCC) 03/27/2016  . Dementia (HCC) 12/22/2009  . Intellectual disability 12/22/2009  . 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 HYPERLIPIDEMIA 11/27/2009    Orientation RESPIRATION BLADDER Height & Weight        Normal Incontinent Weight: 121 lb 4.1 oz (55 kg) Height:  5\' 5"  (165.1 cm)  BEHAVIORAL SYMPTOMS/MOOD NEUROLOGICAL BOWEL NUTRITION STATUS      Incontinent Diet (Regular diet)  AMBULATORY STATUS COMMUNICATION OF NEEDS Skin   Limited Assist Verbally Normal                       Personal Care Assistance Level of Assistance  Feeding, Bathing, Dressing Bathing Assistance: Limited assistance Feeding assistance: Independent Dressing Assistance: Limited assistance     Functional Limitations Info  Sight, Hearing, Speech Sight Info: Adequate Hearing Info: Adequate Speech Info: Adequate    SPECIAL CARE FACTORS FREQUENCY                       Contractures Contractures Info: Not present    Additional Factors Info   Code Status, Allergies Code Status Info: Full Code Allergies Info: Galantamine Tuberculin Ppd           Current Medications (03/09/2020):  This is the current hospital active medication list Current Facility-Administered Medications  Medication Dose Route Frequency Provider Last Rate Last Admin  . 0.9 %  sodium chloride infusion   Intravenous Continuous 03/11/2020, MD 75 mL/hr at 03/09/20 0400 Rate Verify at 03/09/20 0400  . amLODipine (NORVASC) tablet 2.5 mg  2.5 mg Oral QHS 03/11/20, MD   2.5 mg at 03/08/20 2026  . cholecalciferol (VITAMIN D) tablet 2,000 Units  2,000 Units Oral Daily 2027, MD   2,000 Units at 03/09/20 [phone]  . enoxaparin (LOVENOX) injection 40 mg  40 mg Subcutaneous Q24H Opyd, 3716, MD   40 mg at 03/09/20 0938  . feeding supplement (ENSURE ENLIVE) (ENSURE ENLIVE) liquid 237 mL  237 mL Oral BID BM 03/11/20, MD   237 mL at 03/09/20 0938  . fenofibrate tablet 160 mg  160 mg Oral Daily 03/11/20, MD   160 mg at 03/09/20 03/11/20  . memantine (NAMENDA) tablet 10 mg  10 mg Oral BID 9678, MD   10 mg at 03/09/20 03/11/20  . multivitamin with minerals tablet 1 tablet  1 tablet Oral Daily 9381, MD   1 tablet at 03/09/20 [phone]  . potassium chloride SA (  KLOR-CON) CR tablet 20 mEq  20 mEq Oral BID Azucena Fallen, MD   20 mEq at 03/09/20 6568  . Replens GEL 1 applicator  1 applicator Vaginal Weekly Azucena Fallen, MD      . vitamin B-12 (CYANOCOBALAMIN) tablet 1,000 mcg  1,000 mcg Oral Daily Azucena Fallen, MD   1,000 mcg at 03/09/20 1275     Discharge Medications: TAKE these medications   amLODipine 2.5 MG tablet Commonly known as: NORVASC Take 2.5 mg by mouth at bedtime.   fenofibrate 145 MG tablet Commonly known as: TRICOR Take 145 mg by mouth daily.   memantine 10 MG tablet Commonly known as: NAMENDA Take 10 mg by mouth 2 (two) times daily.   Replens Gel Place  1 applicator vaginally once a week. Every Mon   tizanidine 2 MG capsule Commonly known as: ZANAFLEX Take 2 mg by mouth as directed. Take on MWF & Sat   vitamin B-12 1000 MCG tablet Commonly known as: CYANOCOBALAMIN Take 1,000 mcg by mouth daily.   Vitamin D3 50 MCG (2000 UT) Tabs Take 2 capsules by mouth daily.     Relevant Imaging Results:  Relevant Lab Results:   Additional Information SSN [id number]  Darleene Cleaver, LCSW

## 2020-03-09 NOTE — Care Management Important Message (Signed)
Important Message  Patient Details IM Letter given to the Patient Name: Sherri Gross MRN: [id number] Date of Birth: 15-Mar-1946   Medicare Important Message Given:  Yes     Caren Macadam 03/09/2020, 10:24 AM

## 2020-03-18 ENCOUNTER — Emergency Department (HOSPITAL_COMMUNITY)
Admission: EM | Admit: 2020-03-18 | Discharge: 2020-03-18 | Disposition: A | Discharge status: Home / Self Care | Payer: Medicare Other | Attending: Emergency Medicine | Admitting: Emergency Medicine

## 2020-03-18 ENCOUNTER — Other Ambulatory Visit: Payer: Self-pay

## 2020-03-18 ENCOUNTER — Encounter (HOSPITAL_COMMUNITY): Payer: Self-pay | Admitting: *Deleted

## 2020-03-18 ENCOUNTER — Emergency Department (HOSPITAL_COMMUNITY): Payer: Medicare Other

## 2020-03-18 DIAGNOSIS — Z8616 Personal history of COVID-19: Secondary | ICD-10-CM | POA: Insufficient documentation

## 2020-03-18 DIAGNOSIS — E119 Type 2 diabetes mellitus without complications: Secondary | ICD-10-CM | POA: Insufficient documentation

## 2020-03-18 DIAGNOSIS — I10 Essential (primary) hypertension: Secondary | ICD-10-CM | POA: Diagnosis not present

## 2020-03-18 DIAGNOSIS — Z79899 Other long term (current) drug therapy: Secondary | ICD-10-CM | POA: Diagnosis not present

## 2020-03-18 DIAGNOSIS — R531 Weakness: Secondary | ICD-10-CM | POA: Diagnosis not present

## 2020-03-18 DIAGNOSIS — E860 Dehydration: Secondary | ICD-10-CM | POA: Diagnosis present

## 2020-03-18 LAB — CBC WITH DIFFERENTIAL/PLATELET
Abs Immature Granulocytes: 0.02 10*3/uL (ref 0.00–0.07)
Basophils Absolute: 0 10*3/uL (ref 0.0–0.1)
Basophils Relative: 0 %
Eosinophils Absolute: 0.1 10*3/uL (ref 0.0–0.5)
Eosinophils Relative: 1 %
HCT: 36.5 % (ref 36.0–46.0)
Hemoglobin: 11.9 g/dL — ABNORMAL LOW (ref 12.0–15.0)
Immature Granulocytes: 0 %
Lymphocytes Relative: 37 %
Lymphs Abs: 3.3 10*3/uL (ref 0.7–4.0)
MCH: 30.2 pg (ref 26.0–34.0)
MCHC: 32.6 g/dL (ref 30.0–36.0)
MCV: 92.6 fL (ref 80.0–100.0)
Monocytes Absolute: 0.7 10*3/uL (ref 0.1–1.0)
Monocytes Relative: 8 %
Neutro Abs: 4.8 10*3/uL (ref 1.7–7.7)
Neutrophils Relative %: 54 %
Platelets: 322 10*3/uL (ref 150–400)
RBC: 3.94 MIL/uL (ref 3.87–5.11)
RDW: 14.2 % (ref 11.5–15.5)
WBC: 9 10*3/uL (ref 4.0–10.5)
nRBC: 0 % (ref 0.0–0.2)

## 2020-03-18 LAB — BASIC METABOLIC PANEL
Anion gap: 6 (ref 5–15)
BUN: 36 mg/dL — ABNORMAL HIGH (ref 8–23)
CO2: 27 mmol/L (ref 22–32)
Calcium: 8.5 mg/dL — ABNORMAL LOW (ref 8.9–10.3)
Chloride: 113 mmol/L — ABNORMAL HIGH (ref 98–111)
Creatinine, Ser: 0.56 mg/dL (ref 0.44–1.00)
GFR calc Af Amer: >60 mL/min (ref >60)
GFR calc non Af Amer: >60 mL/min (ref >60)
Glucose, Bld: 99 mg/dL (ref 70–99)
Potassium: 3 mmol/L — ABNORMAL LOW (ref 3.5–5.1)
Sodium: 146 mmol/L — ABNORMAL HIGH (ref 135–145)

## 2020-03-18 LAB — URINALYSIS, ROUTINE W REFLEX MICROSCOPIC
Bilirubin Urine: NEGATIVE
Glucose, UA: NEGATIVE mg/dL
Hgb urine dipstick: NEGATIVE
Ketones, ur: NEGATIVE mg/dL
Leukocytes,Ua: NEGATIVE
Nitrite: NEGATIVE
Protein, ur: NEGATIVE mg/dL
Specific Gravity, Urine: 1.023 (ref 1.005–1.030)
pH: 6 (ref 5.0–8.0)

## 2020-03-18 LAB — HEPATIC FUNCTION PANEL
ALT: 15 U/L (ref 0–44)
AST: 12 U/L — ABNORMAL LOW (ref 15–41)
Albumin: 3.4 g/dL — ABNORMAL LOW (ref 3.5–5.0)
Alkaline Phosphatase: 51 U/L (ref 38–126)
Bilirubin, Direct: 0.1 mg/dL (ref 0.0–0.2)
Indirect Bilirubin: 0.5 mg/dL (ref 0.3–0.9)
Total Bilirubin: 0.6 mg/dL (ref 0.3–1.2)
Total Protein: 6 g/dL — ABNORMAL LOW (ref 6.5–8.1)

## 2020-03-18 MED ORDER — DOXYCYCLINE HYCLATE 100 MG PO CAPS: 100.0000 mg | ORAL_CAPSULE | Freq: Two times a day (BID) | ORAL | 0 refills | Status: AC

## 2020-03-18 MED ORDER — SODIUM CHLORIDE 0.9 % IV BOLUS
1000.0000 mL | Freq: Once | INTRAVENOUS | Status: AC
  Administered 2020-03-18: 1000 mL via INTRAVENOUS

## 2020-03-18 MED ORDER — DOXYCYCLINE HYCLATE 100 MG PO TABS
100.0000 mg | ORAL_TABLET | Freq: Once | ORAL | Status: AC
  Administered 2020-03-18: 100 mg via ORAL
  Filled 2020-03-18: qty 1

## 2020-03-18 MED ORDER — POTASSIUM CHLORIDE 20 MEQ/15ML (10%) PO SOLN
40.0000 meq | Freq: Once | ORAL | Status: AC
  Administered 2020-03-18: 40 meq via ORAL
  Filled 2020-03-18: qty 30

## 2020-03-18 NOTE — ED Notes (Signed)
Pt ate a container of applesauce along with water and orange juice.

## 2020-03-18 NOTE — ED Triage Notes (Signed)
Pt BIB EMS from the [facility] [phone] [address]). Pt dx w/ COVID on 8/9.  Pt was sent home after the hospital visit. Staff reports that pt was less responsive and more congestion. Staff was unable to tell EMS how long pt was having more congestion and less responsiveness. Pt is normally able to talk but staff reports that's not the case today.  EMS reports pt keeps asking for water. Staff also report decreased food intake but does drink PO fluids. Pt baseline dementia and MR.

## 2020-03-18 NOTE — ED Provider Notes (Signed)
[facility]-EMERGENCY DEPT Provider Note   CSN: [id number] Arrival date & time: 03/18/20  1247     History Chief Complaint  Patient presents with  . Weakness    Sherri Gross is a 73 y.o. female.  ThankPer EMS and caregiver patient brought here due to concern for dehydration.  Recent UTI over the infection earlier in the month.  Has had some poor p.o. intake.  Worse concern as patient had not made much urine today.  Patient nonverbal at baseline.  Left-sided weakness at baseline.  No falls.  The history is provided by a caregiver and the EMS personnel.  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 Arthritis   . Cataracts, bilateral   . Cerebellar ataxia (HCC)   . Dementia (HCC)   . Hearing loss   . Hypercholesteremia   . Hypertension   . IDDM (insulin dependent diabetes mellitus)   . Osteoarthritis   . Presbyopia   . Psychotic disorder [facility])     Patient Active Problem List   Diagnosis Date Noted  . Acute encephalopathy 03/03/2020  . COVID-19 03/03/2020  . Pneumonia 03/03/2020  . Memory loss 07/27/2016  . Left spastic hemiparesis (HCC) 03/27/2016  . Dementia (HCC) 12/22/2009  . Intellectual disability 12/22/2009  . 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 HYPERLIPIDEMIA 11/27/2009    Past Surgical History:  Procedure Laterality Date  . denies surgical history       OB History   No obstetric history on file.     No family history on file.  Social History   Tobacco Use  . Smoking status: Never Smoker  . Smokeless tobacco: Never Used  Vaping Use  . Vaping Use: Unknown    Substance Use Topics  . Alcohol use: No  . Drug use: No    Home Medications Prior to Admission medications   Medication Sig Start Date End Date Taking? Authorizing Provider  amLODipine (NORVASC) 2.5 MG tablet Take 2.5 mg by mouth at bedtime.     [provider]  Cholecalciferol (VITAMIN D3) 2000 units TABS Take 2 capsules by mouth daily.    [provider]  doxycycline (VIBRAMYCIN) 100 MG capsule Take 1 capsule (100 mg total) by mouth 2 (two) times daily for 10 days. 03/18/20 03/28/20  Earnest Thalman, DO  fenofibrate (TRICOR) 145 MG tablet Take 145 mg by mouth daily.    [provider]  memantine (NAMENDA) 10 MG tablet Take 10 mg by mouth 2 (two) times daily.    [provider]  tizanidine (ZANAFLEX) 2 MG capsule Take 2 mg by mouth as directed. Take on MWF & Sat    [provider]  Vaginal Lubricant (REPLENS) GEL Place 1 applicator vaginally once a week. Every Mon    [provider]  vitamin B-12 (CYANOCOBALAMIN) 1000 MCG tablet Take 1,000 mcg by mouth daily.    [provider]    Allergies    Galantamine and Tuberculin ppd  Review of Systems   Review of Systems  Unable to perform ROS: Patient nonverbal    Physical Exam Updated Vital Signs  ED Triage Vitals  Enc Vitals Group  BP 03/18/20 1332 (!) 171/75     Pulse Rate 03/18/20 1332 (!) 56     Resp 03/18/20 1332 16     Temp 03/18/20 1332 98.5 F (36.9 C)     Temp Source 03/18/20 1332 Oral     SpO2 03/18/20 1311 94 %     Weight --      Height --      Head Circumference --      Peak Flow --      Pain Score --      Pain Loc --      Pain Edu? --      Excl. in GC? --     Physical Exam Vitals and nursing note reviewed.  Constitutional:      General: She is not in acute distress.    Appearance: She is well-developed. She is not ill-appearing.  HENT:     Head: Normocephalic and atraumatic.     Mouth/Throat:     Mouth: Mucous membranes are dry.  Eyes:      Conjunctiva/sclera: Conjunctivae normal.  Cardiovascular:     Rate and Rhythm: Normal rate and regular rhythm.     Pulses: Normal pulses.     Heart sounds: Normal heart sounds. No murmur heard.   Pulmonary:     Effort: Pulmonary effort is normal. No respiratory distress.     Comments: Coarse breath sounds  Abdominal:     Palpations: Abdomen is soft.     Tenderness: There is no abdominal tenderness.  Musculoskeletal:     Cervical back: Normal range of motion and neck supple.  Skin:    General: Skin is warm and dry.  Neurological:     General: No focal deficit present.     Mental Status: She is alert.     Comments: Follows commands, tracks wells with her eyes     ED Results / Procedures / Treatments   Labs (all labs ordered are listed, but only abnormal results are displayed) Labs Reviewed  CBC WITH DIFFERENTIAL/PLATELET - Abnormal; Notable for the following components:      Result Value   Hemoglobin 11.9 (*)    All other components within normal limits  BASIC METABOLIC PANEL - Abnormal; Notable for the following components:   Sodium 146 (*)    Potassium 3.0 (*)    Chloride 113 (*)    BUN 36 (*)    Calcium 8.5 (*)    All other components within normal limits  HEPATIC FUNCTION PANEL - Abnormal; Notable for the following components:   Total Protein 6.0 (*)    Albumin 3.4 (*)    AST 12 (*)    All other components within normal limits  URINE CULTURE  URINALYSIS, ROUTINE W REFLEX MICROSCOPIC    EKG EKG Interpretation  Date/Time:  Wednesday March 18 2020 13:35:04 EDT Ventricular Rate:  56 PR Interval:    QRS Duration: 101 QT Interval:  470 QTC Calculation: 454 R Axis:   15 Text Interpretation: Sinus rhythm Abnormal R-wave progression, early transition Confirmed by Virgina Norfolk [phone]) on 03/18/2020 2:25:50 PM   Radiology DG Chest Portable 1 View  Result Date: 03/18/2020 CLINICAL DATA:  Congestion with altered mental status EXAM: PORTABLE CHEST 1 VIEW  COMPARISON:  March 02, 2020 FINDINGS: There is subtle ill-defined opacity in the lateral left base and right mid lung. Lungs elsewhere are clear. Heart size and pulmonary vascularity are normal. No adenopathy. There is extensive calcification in the left shoulder region consistent with synovial  chondromatosis. IMPRESSION: Subtle airspace opacity lateral left base and right mid lung, likely due to developing atypical organism pneumonia in these areas. Lungs elsewhere clear. Cardiac silhouette normal. Synovial chondromatosis left shoulder. Electronically Signed   By: Bretta Bang III M.D.   On: 03/18/2020 13:36    Procedures Procedures (including critical care time)  Medications Ordered in ED Medications  potassium chloride 20 MEQ/15ML (10%) solution 40 mEq (has no administration in time range)  doxycycline (VIBRA-TABS) tablet 100 mg (has no administration in time range)  sodium chloride 0.9 % bolus 1,000 mL (1,000 mLs Intravenous New Bag/Given 03/18/20 1406)    ED Course  I have reviewed the triage vital signs and the nursing notes.  Pertinent labs & imaging results that were available during my care of the patient were reviewed by me and considered in my medical decision making (see chart for details).    MDM Rules/Calculators/A&P                          AUBRII SHARPLESS is a 74 year old female with history of dementia, stroke with left-sided deficit, intellectual disability who presents to the ED with concern for dehydration.  Patient recently with UTI and Covid diagnosis earlier in the month.  Per her home nurse chest x-ray has been stable and still with some infilitrates.  Not in any respiratory distress.  Has had decreased p.o. intake.  Decreased urinary output.  Mostly concerned for dehydration.  Will get rectal temperature, in and out catheter urine.  Will give fluid bolus.  Overall she appears to be at her baseline.  She follows commands.  She is nonverbal.  She appears to track well  with her eyes.  Her mucous membranes are dry.  We will get labs including chest x-ray, urinalysis.  Will check for AKI, electrolyte abnormalities.  Will give fluid bolus.  Rectal temperature is normal.  Vital signs overall unremarkable.  Patient with normal room air oxygenation.  In and out was attempted by a nurse but there is no urine.  Chest x-ray does show some airspace disease but more likely this is from coronavirus but may consider antibiotics.  However lower concern for sepsis at this time given normal vital signs.  No fever.  Awaiting lab work, fluid bolus, urinalysis.  Overall she does not appear to be too far from her baseline.  Urinalysis negative for infection.  No significant anemia, electrolyte abnormality, kidney injury.  Overall suspect chest x-ray likely from Covid however shared decision was made with nurse from facility to start patient on doxycycline.  Discharged in good condition.  Recommend follow-up with primary care doctor.  Understand return precautions.  This chart was dictated using voice recognition software.  Despite best efforts to proofread,  errors can occur which can change the documentation meaning.    Final Clinical Impression(s) / ED Diagnoses Final diagnoses:  Weakness    Rx / DC Orders ED Discharge Orders         Ordered    doxycycline (VIBRAMYCIN) 100 MG capsule  2 times daily        03/18/20 1440           [place], Madelaine Bhat, DO 03/18/20 1440

## 2020-03-18 NOTE — Discharge Instructions (Addendum)
Will treat possible lung infection with antibiotic but suspect lung inflammation from recent covid infection.

## 2020-03-18 NOTE — ED Notes (Signed)
[facility] called for transport.  

## 2020-03-19 LAB — URINE CULTURE: Culture: <10000 — AB

## 2020-11-24 ENCOUNTER — Other Ambulatory Visit: Payer: Self-pay | Admitting: Family Medicine

## 2020-11-24 DIAGNOSIS — Z1231 Encounter for screening mammogram for malignant neoplasm of breast: Secondary | ICD-10-CM

## 2020-12-08 ENCOUNTER — Emergency Department (HOSPITAL_COMMUNITY)
Admission: EM | Admit: 2020-12-08 | Discharge: 2020-12-08 | Disposition: A | Discharge status: Home / Self Care | Payer: Medicare Other | Attending: Emergency Medicine | Admitting: Emergency Medicine

## 2020-12-08 ENCOUNTER — Other Ambulatory Visit: Payer: Self-pay

## 2020-12-08 ENCOUNTER — Emergency Department (HOSPITAL_COMMUNITY): Payer: Medicare Other

## 2020-12-08 DIAGNOSIS — I10 Essential (primary) hypertension: Secondary | ICD-10-CM | POA: Diagnosis not present

## 2020-12-08 DIAGNOSIS — W228XXA Striking against or struck by other objects, initial encounter: Secondary | ICD-10-CM | POA: Insufficient documentation

## 2020-12-08 DIAGNOSIS — S0081XA Abrasion of other part of head, initial encounter: Secondary | ICD-10-CM | POA: Diagnosis not present

## 2020-12-08 DIAGNOSIS — F0390 Unspecified dementia without behavioral disturbance: Secondary | ICD-10-CM | POA: Diagnosis not present

## 2020-12-08 DIAGNOSIS — Z79899 Other long term (current) drug therapy: Secondary | ICD-10-CM | POA: Insufficient documentation

## 2020-12-08 DIAGNOSIS — Z8616 Personal history of COVID-19: Secondary | ICD-10-CM | POA: Diagnosis not present

## 2020-12-08 DIAGNOSIS — S0990XA Unspecified injury of head, initial encounter: Secondary | ICD-10-CM | POA: Diagnosis present

## 2020-12-08 DIAGNOSIS — E119 Type 2 diabetes mellitus without complications: Secondary | ICD-10-CM | POA: Diagnosis not present

## 2020-12-08 NOTE — ED Triage Notes (Signed)
Unwitnessed head injury, residents believe she hit it on corner of night stand when getting out of bed. pt lives at [facility] group home.denies blood thinners. Home director notified PCP and they wanted her here to be evaluated. HOH, mentally challenged, pt is at her baseline.

## 2020-12-08 NOTE — ED Provider Notes (Signed)
[facility]-EMERGENCY DEPT Provider Note   CSN: [id number] Arrival date & time: 12/08/20  0845     History Chief Complaint  Patient presents with  . Head Injury    AYRIEL TEXIDOR is a 75 y.o. female.  Presenting to ER with concern for head injury.  Unwitnessed.  Patient reports having had on edge of nightstand.  Denies any acute medical complaints at present.  Not on blood thinners.  Denies any other pain and rest of body, physically no neck back chest or abdominal pain.  History limited due to baseline mental status.  At baseline per report.  HPI     Past Medical History:  Diagnosis Date  . Arthritis   . Cataracts, bilateral   . Cerebellar ataxia (HCC)   . Dementia (HCC)   . Hearing loss   . Hypercholesteremia   . Hypertension   . IDDM (insulin dependent diabetes mellitus)   . Osteoarthritis   . Presbyopia   . Psychotic disorder [facility])     Patient Active Problem List   Diagnosis Date Noted  . Acute encephalopathy 03/03/2020  . COVID-19 03/03/2020  . Pneumonia 03/03/2020  . Memory loss 07/27/2016  . Left spastic hemiparesis (HCC) 03/27/2016  . Dementia (HCC) 12/22/2009  . Intellectual disability 12/22/2009  . 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 HYPERLIPIDEMIA 11/27/2009    Past Surgical History:  Procedure Laterality Date  . denies surgical history       OB History   No obstetric history on file.     No family history on file.  Social History   Tobacco Use  . Smoking status: Never Smoker  . Smokeless tobacco: Never Used  Vaping Use  . Vaping Use: Unknown  Substance Use Topics  . Alcohol use: No  . Drug use: No    Home Medications Prior to Admission medications   Medication Sig Start Date End Date Taking? Authorizing Provider  amLODipine (NORVASC) 2.5 MG tablet Take 2.5 mg by mouth at bedtime.     [provider]  Cholecalciferol (VITAMIN D3) 2000 units TABS Take 2 capsules by mouth daily.    [provider]  fenofibrate (TRICOR) 145 MG tablet Take 145 mg by mouth daily.    [provider]  memantine (NAMENDA) 10 MG tablet Take 10 mg by mouth 2 (two) times daily.    [provider]  tizanidine (ZANAFLEX) 2 MG capsule Take 2 mg by mouth as directed. Take on MWF & Sat    [provider]  Vaginal Lubricant (REPLENS) GEL Place 1 applicator vaginally once a week. Every Mon    [provider]  vitamin B-12 (CYANOCOBALAMIN) 1000 MCG tablet Take 1,000 mcg by mouth daily.    [provider]    Allergies    Galantamine and Tuberculin ppd  Review of Systems   Review of Systems  Unable to perform ROS: Dementia    Physical Exam Updated Vital Signs BP 136/66   Pulse (!) 54   Temp (!) 97.5 F (36.4 C) (Oral)   Resp 14   SpO2 98%   Physical Exam Vitals and nursing note reviewed.  Constitutional:      General: She is not in acute distress.    Appearance: She is well-developed.  HENT:     Head: Normocephalic.     Comments: Superficial abrasion to forehead, no significant laceration Eyes:     Conjunctiva/sclera: Conjunctivae normal.  Cardiovascular:     Rate and Rhythm: Normal rate and  regular rhythm.     Heart sounds: No murmur heard.   Pulmonary:     Effort: Pulmonary effort is normal. No respiratory distress.     Breath sounds: Normal breath sounds.  Abdominal:     Palpations: Abdomen is soft.     Tenderness: There is no abdominal tenderness.  Musculoskeletal:     Cervical back: Neck supple.     Comments: No tenderness to palpation along C, T, L-spine, no tenderness to palpation throughout all 4 extremities  Skin:    General: Skin is warm and dry.  Neurological:     Mental Status: She is alert.     ED Results / Procedures / Treatments   Labs (all labs ordered are listed, but only abnormal results are displayed) Labs Reviewed - No data to display  EKG None  Radiology CT Head Wo Contrast  Result Date: 12/08/2020 CLINICAL  DATA:  Unwitnessed head injury EXAM: CT HEAD WITHOUT CONTRAST TECHNIQUE: Contiguous axial images were obtained from the base of the skull through the vertex without intravenous contrast. COMPARISON:  None FINDINGS: Brain: There is atrophy and chronic small vessel disease changes. Diffuse cerebellar atrophy. No acute intracranial abnormality. Specifically, no hemorrhage, hydrocephalus, mass lesion, acute infarction, or significant intracranial injury. Vascular: No hyperdense vessel or unexpected calcification. Skull: No acute calvarial abnormality. Sinuses/Orbits: Opacified right ethmoid air cells and frontal sinus. Other: None IMPRESSION: Diffuse cerebral and cerebellar atrophy. Chronic small vessel disease. No acute intracranial abnormality. Electronically Signed   By: Charlett Nose M.D.   On: 12/08/2020 10:36   CT Cervical Spine Wo Contrast  Result Date: 12/08/2020 CLINICAL DATA:  Unwitnessed fall EXAM: CT CERVICAL SPINE WITHOUT CONTRAST TECHNIQUE: Multidetector CT imaging of the cervical spine was performed without intravenous contrast. Multiplanar CT image reconstructions were also generated. COMPARISON:  None. FINDINGS: Alignment: Normal Skull base and vertebrae: No acute fracture. No primary bone lesion or focal pathologic process. Soft tissues and spinal canal: No prevertebral fluid or swelling. No visible canal hematoma. Disc levels:  Diffuse degenerative disc disease and facet disease. Upper chest: No acute findings Other: None IMPRESSION: Degenerative changes throughout the cervical spine. No acute bony abnormality. Electronically Signed   By: Charlett Nose M.D.   On: 12/08/2020 10:42    Procedures Procedures   Medications Ordered in ED Medications - No data to display  ED Course  I have reviewed the triage vital signs and the nursing notes.  Pertinent labs & imaging results that were available during my care of the patient were reviewed by me and considered in my medical decision making (see  chart for details).    MDM Rules/Calculators/A&P                          75 year old lady presenting to ER with concern for possible head trauma.  Patient appears well, vitals stable.  CT imaging negative.  Will discharge back to facility.  After the discussed management above, the patient was determined to be safe for discharge.  ED return precautions were discussed and the patient will return to the ED with any significant worsening of condition.  Final Clinical Impression(s) / ED Diagnoses Final diagnoses:  Injury of head, initial encounter    Rx / DC Orders ED Discharge Orders    None       Milagros Loll, MD 12/09/20 2121

## 2020-12-08 NOTE — Discharge Instructions (Signed)
Follow-up with your primary care doctor.  Return to ER as needed. ?

## 2021-01-18 ENCOUNTER — Inpatient Hospital Stay: Admission: RE | Admit: 2021-01-18 | Payer: Medicare Other | Source: Ambulatory Visit

## 2021-04-07 ENCOUNTER — Other Ambulatory Visit: Payer: Self-pay | Admitting: Family Medicine

## 2021-04-07 DIAGNOSIS — Z1231 Encounter for screening mammogram for malignant neoplasm of breast: Secondary | ICD-10-CM

## 2021-05-14 ENCOUNTER — Encounter (HOSPITAL_COMMUNITY): Payer: Self-pay | Admitting: *Deleted

## 2021-05-14 ENCOUNTER — Other Ambulatory Visit: Payer: Self-pay

## 2021-05-14 ENCOUNTER — Ambulatory Visit
Admission: RE | Admit: 2021-05-14 | Discharge: 2021-05-14 | Disposition: A | Discharge status: Home / Self Care | Payer: Medicare Other | Source: Ambulatory Visit | Attending: Family Medicine | Admitting: Family Medicine

## 2021-05-14 DIAGNOSIS — Z1231 Encounter for screening mammogram for malignant neoplasm of breast: Secondary | ICD-10-CM

## 2021-06-14 ENCOUNTER — Ambulatory Visit: Payer: Medicare Other

## 2021-10-06 IMAGING — CR DG CHEST 2V
3 series · 3 of 3 positions shown · non-contrast
Comparison: June 06, 2014

CLINICAL DATA: Rhonchi.

EXAM:
CHEST - 2 VIEW

[w chest lat (1 of 2)]
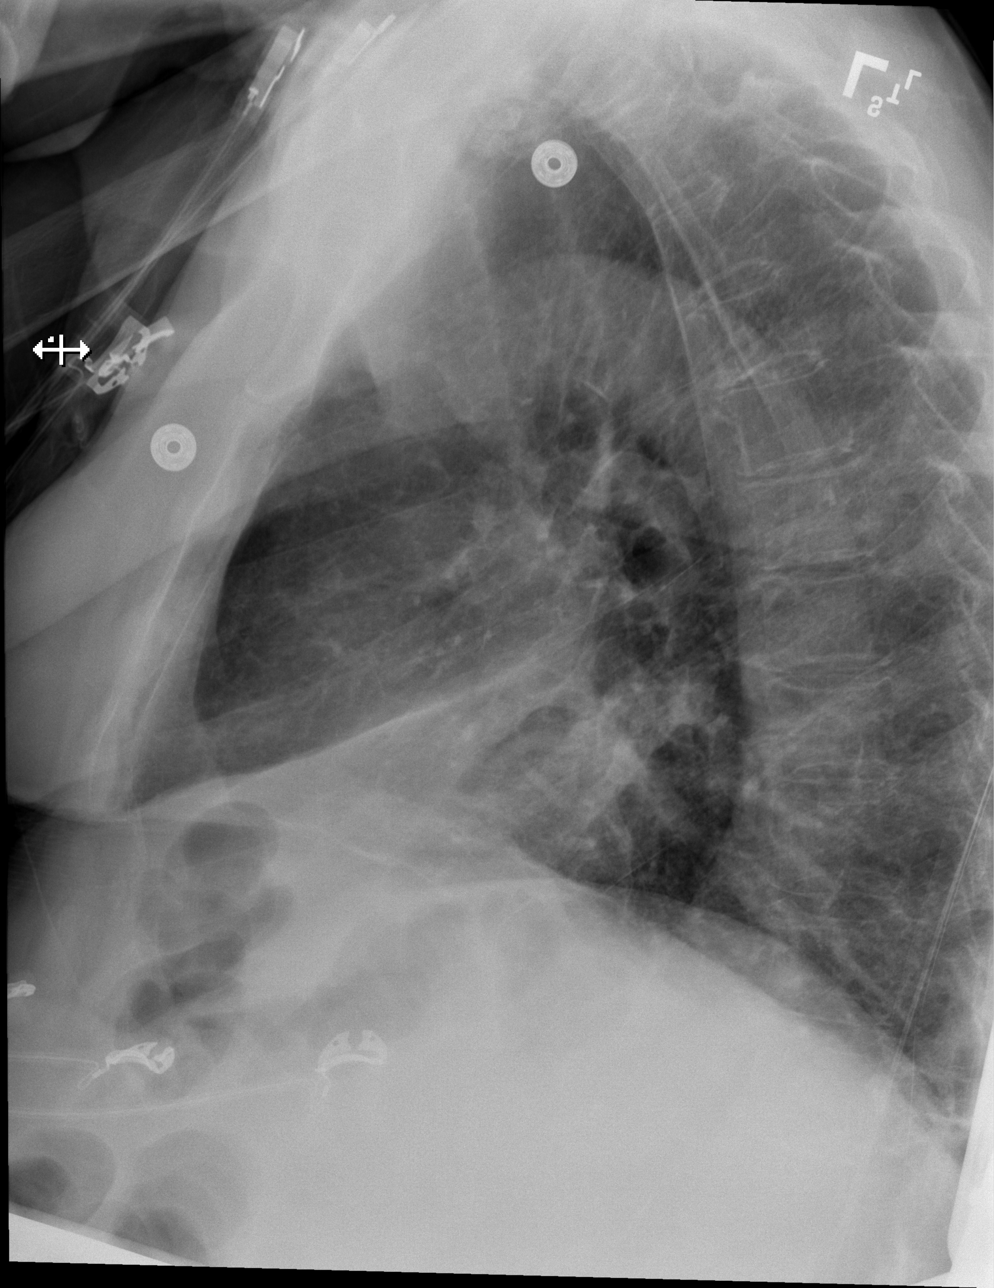

[w chest lat (2 of 2)]
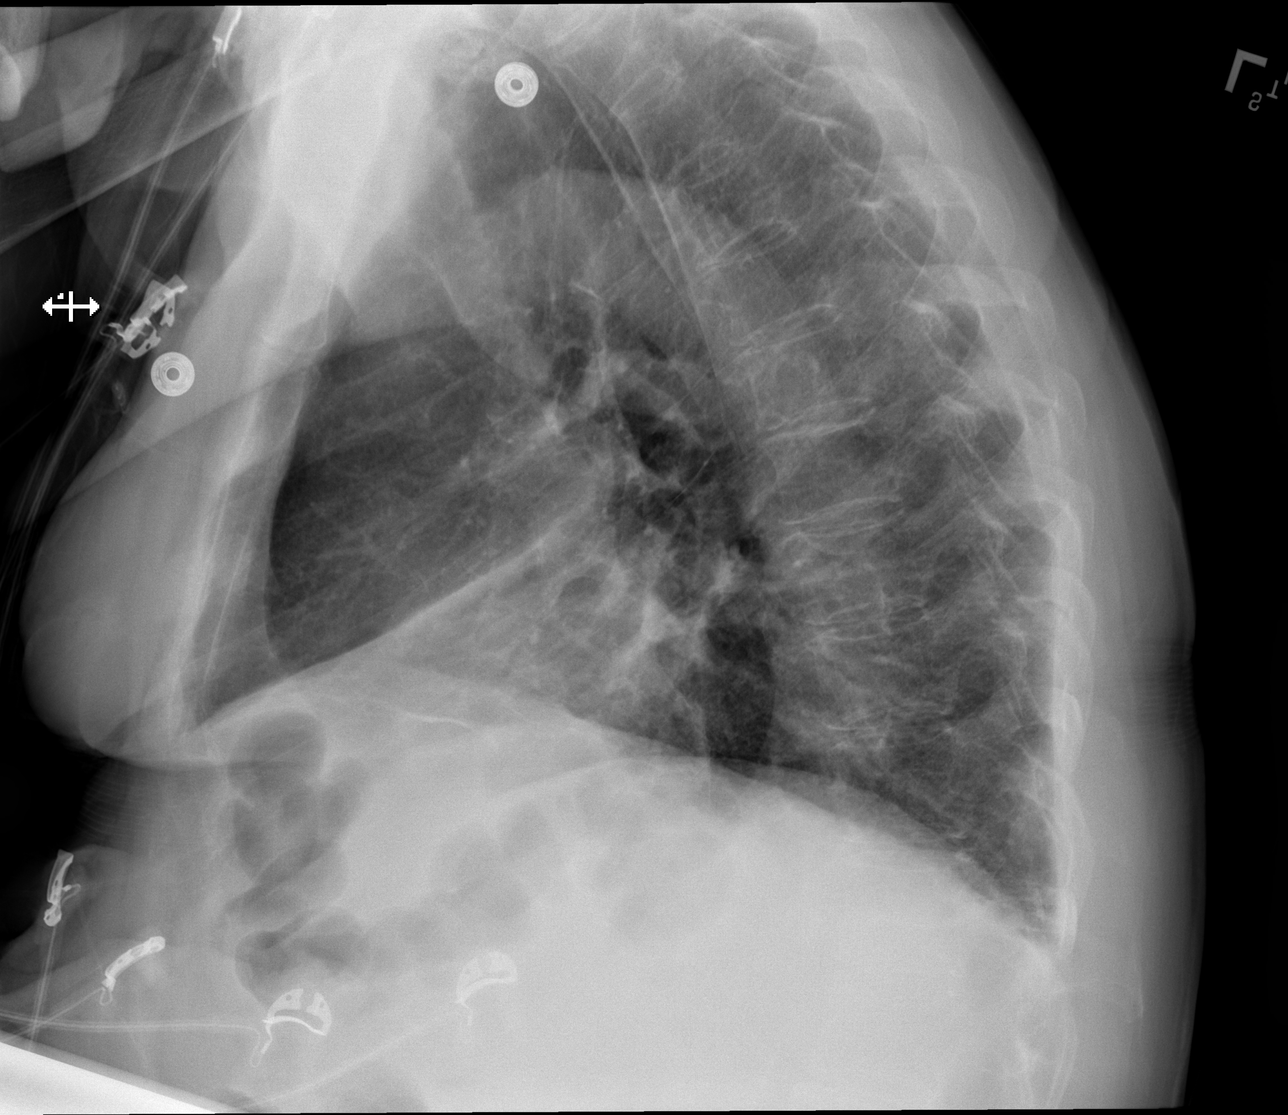

[x chest ap]
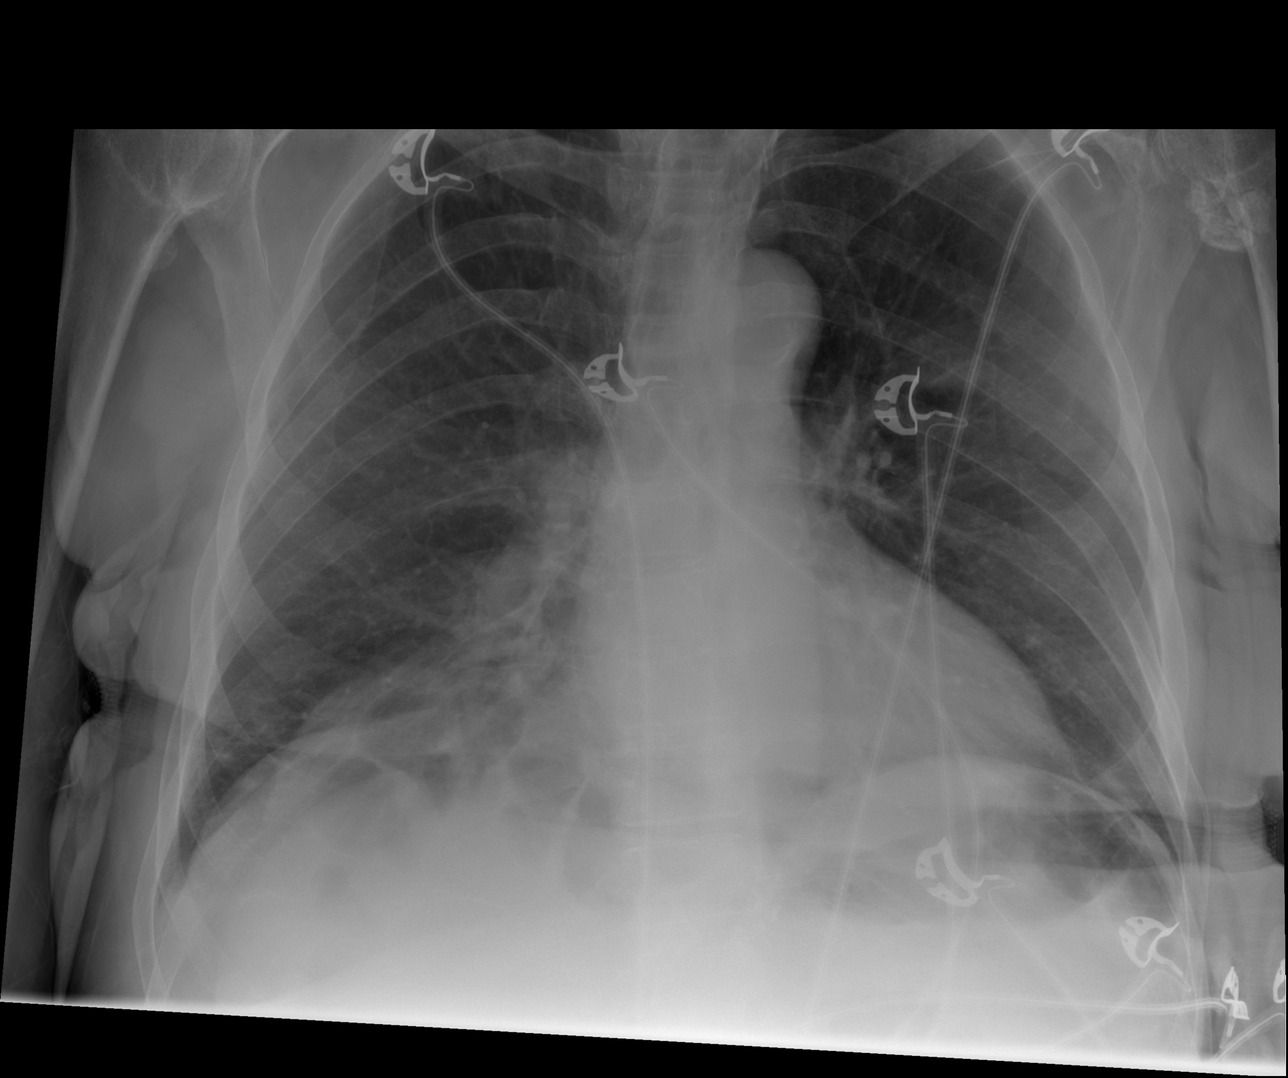

[3 of 3 positions shown; findings below may reference images not displayed]

FINDINGS: There is an airspace opacity in the right middle lobe. There is no
pneumothorax. No large pleural effusion. The heart size is normal.
Aortic calcifications are noted. There are multiple lucencies
overlying the patient's hepatic dome which are favored to represent
interposed loops of bowel. There is osteopenia without evidence for
an acute displaced compression fracture.
IMPRESSION: Right middle lobe airspace opacity compatible with pneumonia.
Follow-up to radiologic resolution is recommended.

## 2022-04-25 ENCOUNTER — Other Ambulatory Visit: Payer: Self-pay | Admitting: Family Medicine

## 2022-04-25 ENCOUNTER — Ambulatory Visit
Admission: RE | Admit: 2022-04-25 | Discharge: 2022-04-25 | Disposition: A | Discharge status: Home / Self Care | Payer: Medicare Other | Source: Ambulatory Visit | Attending: Family Medicine | Admitting: Family Medicine

## 2022-04-25 DIAGNOSIS — R0989 Other specified symptoms and signs involving the circulatory and respiratory systems: Secondary | ICD-10-CM

## 2022-04-25 DIAGNOSIS — R062 Wheezing: Secondary | ICD-10-CM

## 2022-07-14 IMAGING — CT CT HEAD W/O CM
3 series · 16 of 47 positions shown, 19 images · non-contrast
Comparison: None

CLINICAL DATA: Unwitnessed head injury

EXAM:
CT HEAD WITHOUT CONTRAST
TECHNIQUE: Contiguous axial images were obtained from the base of the skull
through the vertex without intravenous contrast.

[Series 3: head wo · axial · 0.43mm/px · z∈[-101,+24]mm · 10 of 31 slices shown, 13 images]
[im 3/31  brain]
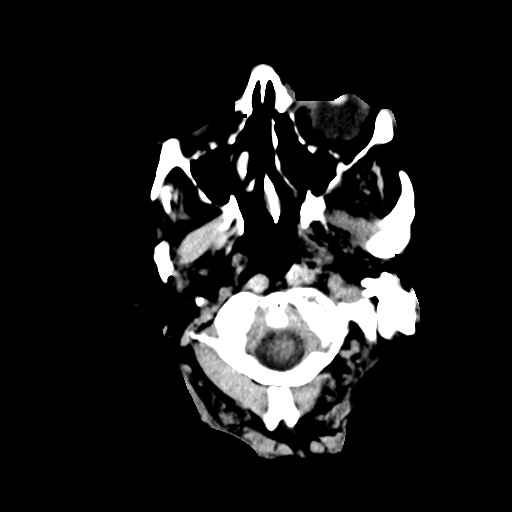
[im 3/31  bone]
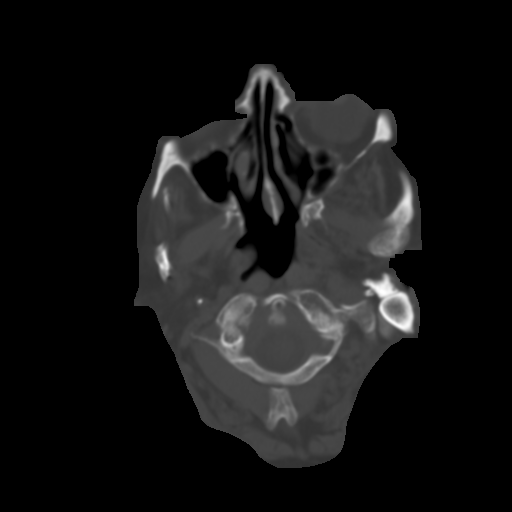
[im 6/31  brain]
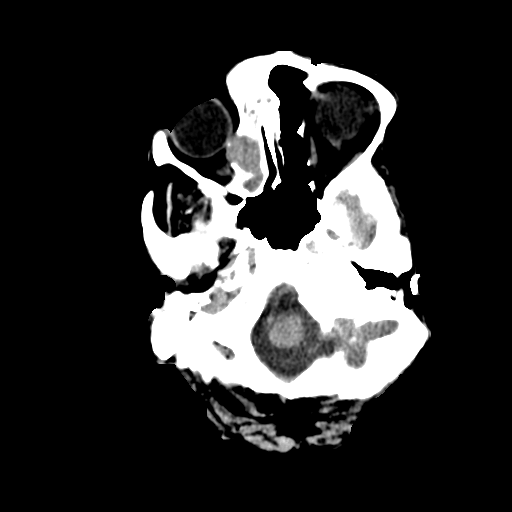
[im 9/31  brain]
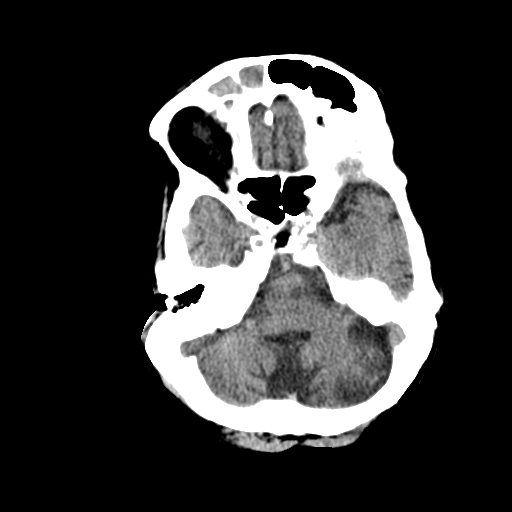
[im 11/31  brain]
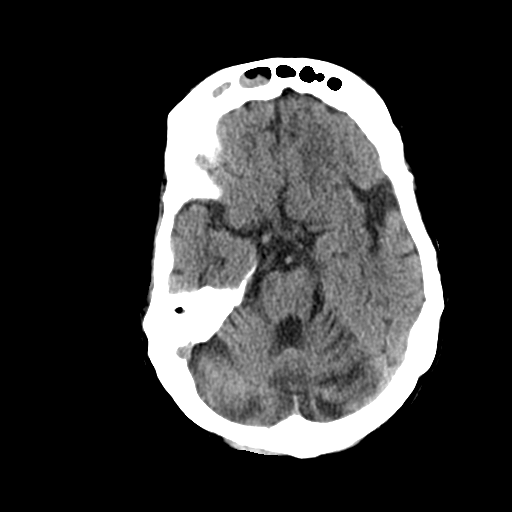
[im 14/31  brain]
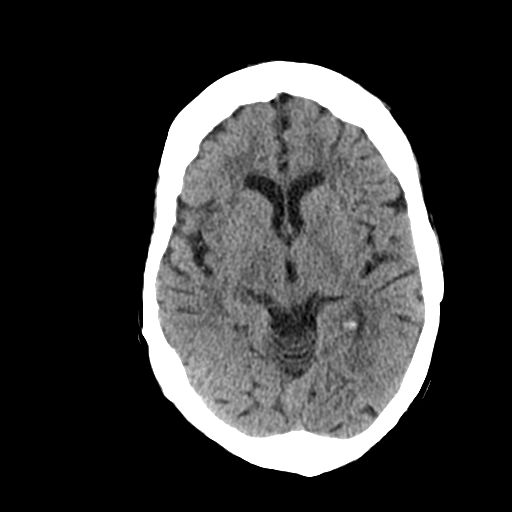
[im 14/31  bone]
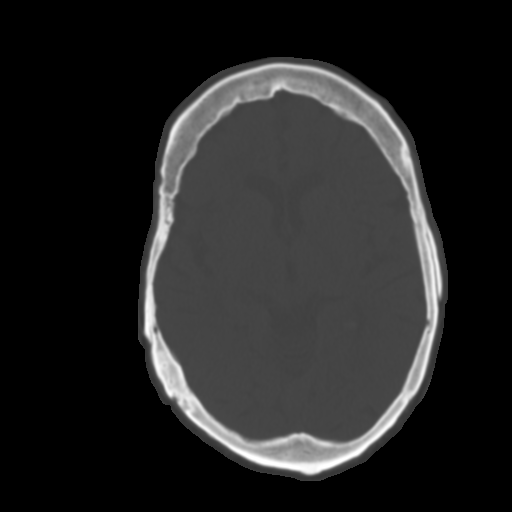
[im 17/31  brain]
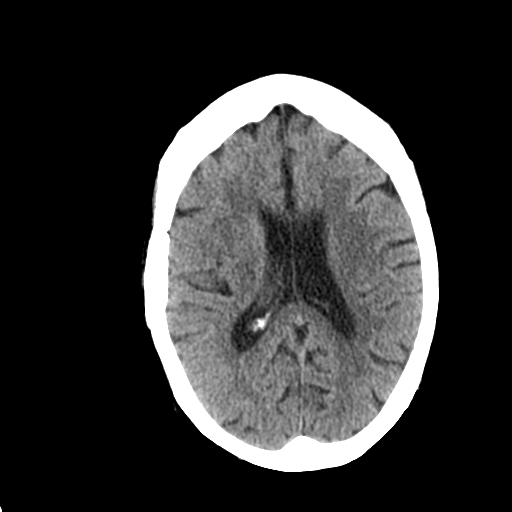
[im 20/31  brain]
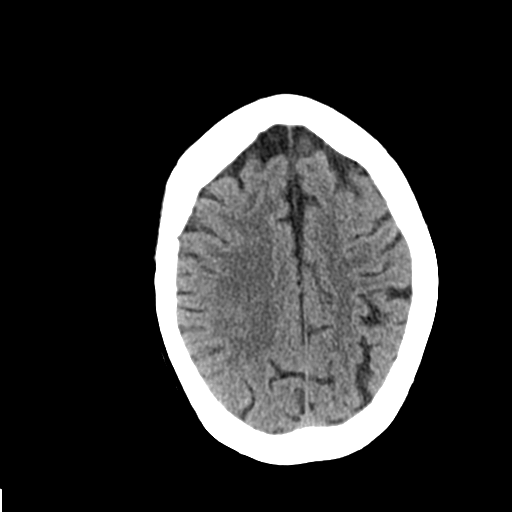
[im 23/31  brain]
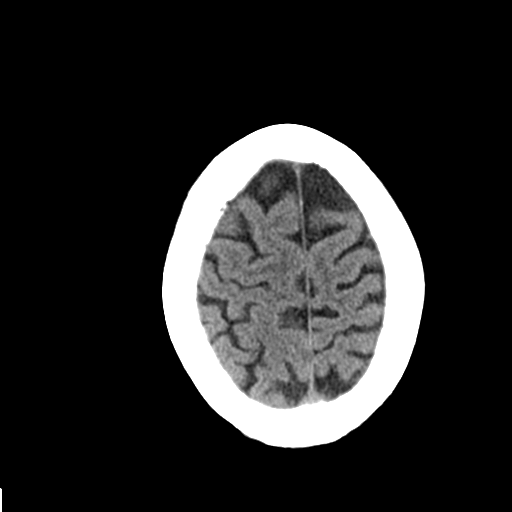
[im 25/31  brain]
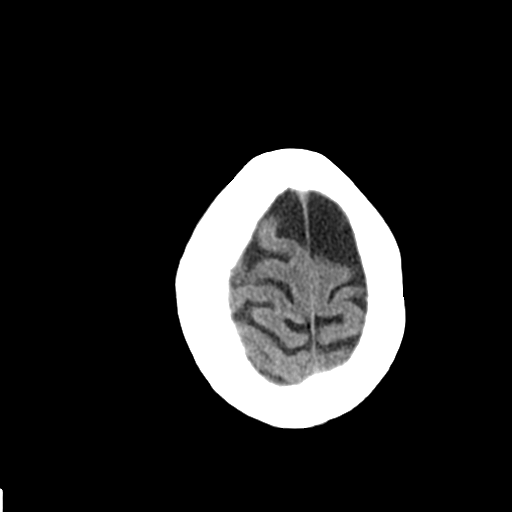
[im 25/31  bone]
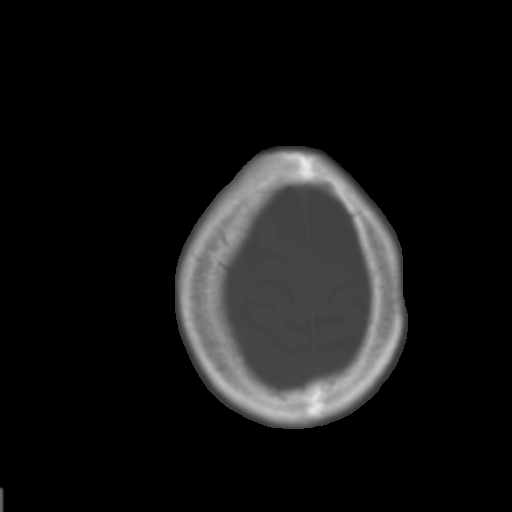
[im 28/31  brain]
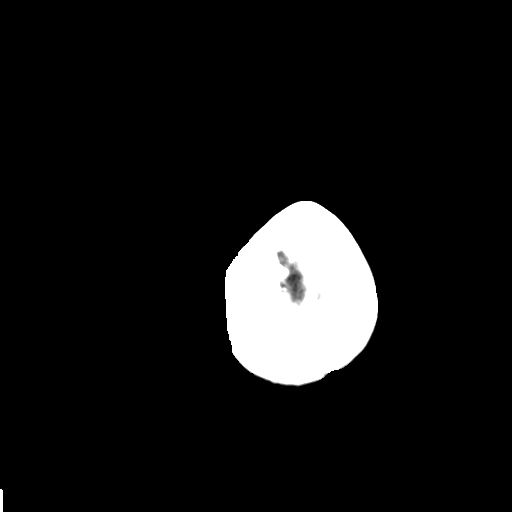

[Series 6: coronal soft tissue · coronal · 0.32mm/px · 3 of 71 slices shown]
[im 24/71  brain]
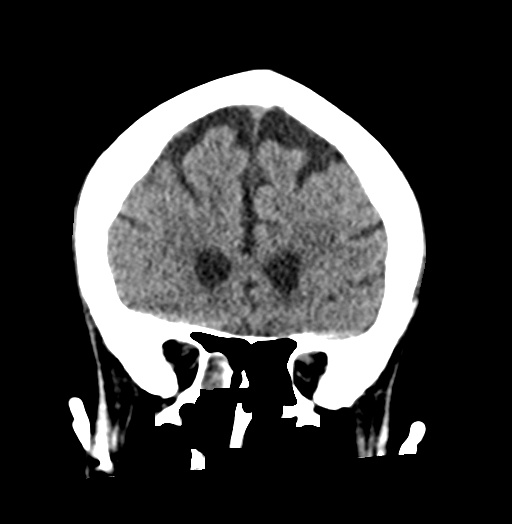
[im 32/71  brain]
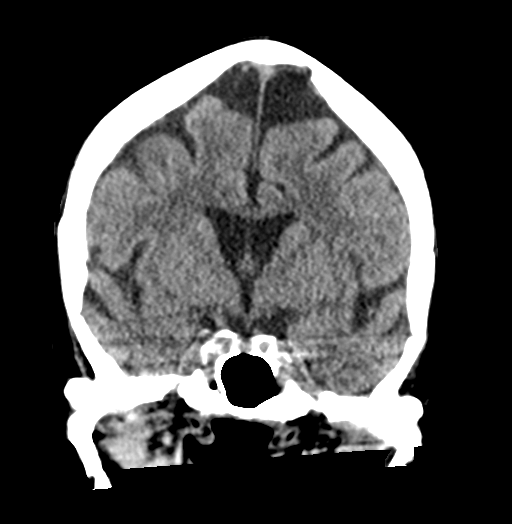
[im 39/71  brain]
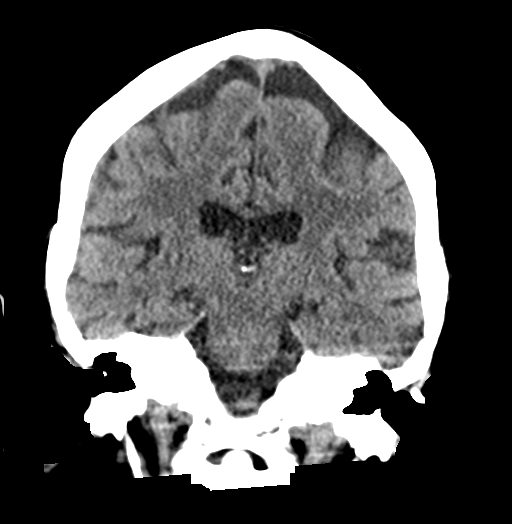

[Series 7: sagittal soft tissue · sagittal · 0.30mm/px · 3 of 59 slices shown]
[im 20/59  brain]
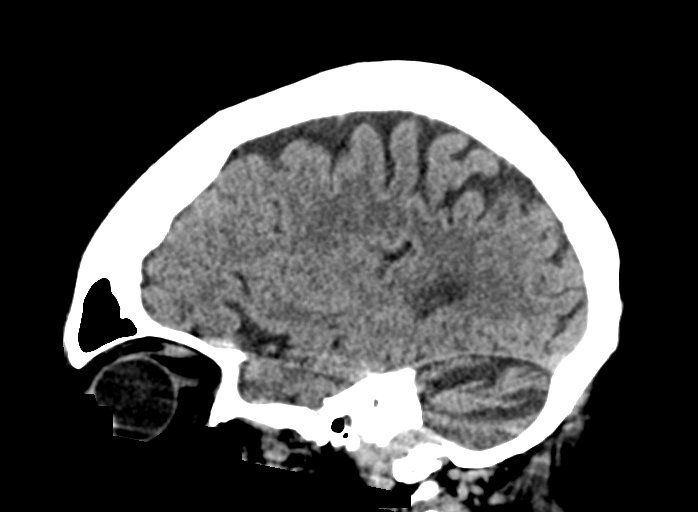
[im 30/59  brain]
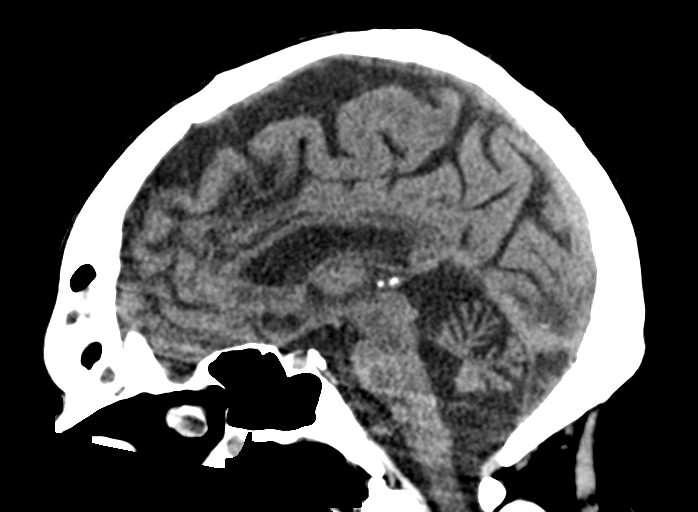
[im 39/59  brain]
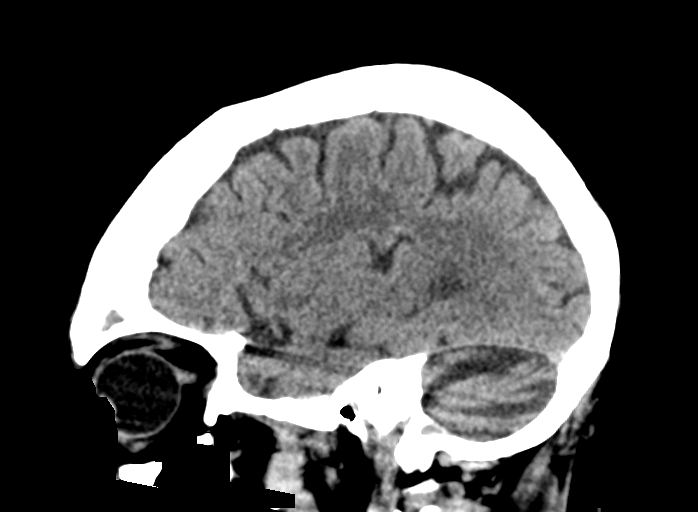

[16 of 47 positions shown; findings below may reference images not displayed]

FINDINGS: Brain: There is atrophy and chronic small vessel disease changes.
Diffuse cerebellar atrophy. No acute intracranial abnormality.
Specifically, no hemorrhage, hydrocephalus, mass lesion, acute
infarction, or significant intracranial injury.

Vascular: No hyperdense vessel or unexpected calcification.

Skull: No acute calvarial abnormality.

Sinuses/Orbits: Opacified right ethmoid air cells and frontal sinus.

Other: None
IMPRESSION: Diffuse cerebral and cerebellar atrophy. Chronic small vessel
disease.

No acute intracranial abnormality.

## 2023-10-02 ENCOUNTER — Emergency Department (HOSPITAL_COMMUNITY)
Admission: EM | Admit: 2023-10-02 | Discharge: 2023-10-02 | Disposition: A | Discharge status: Other Acute Inpatient Hospital | Attending: Emergency Medicine | Admitting: Emergency Medicine

## 2023-10-02 ENCOUNTER — Other Ambulatory Visit: Payer: Self-pay

## 2023-10-02 ENCOUNTER — Emergency Department (HOSPITAL_COMMUNITY)

## 2023-10-02 ENCOUNTER — Encounter (HOSPITAL_COMMUNITY): Payer: Self-pay

## 2023-10-02 DIAGNOSIS — R5383 Other fatigue: Secondary | ICD-10-CM | POA: Insufficient documentation

## 2023-10-02 DIAGNOSIS — F79 Unspecified intellectual disabilities: Secondary | ICD-10-CM | POA: Diagnosis not present

## 2023-10-02 DIAGNOSIS — R410 Disorientation, unspecified: Secondary | ICD-10-CM | POA: Insufficient documentation

## 2023-10-02 DIAGNOSIS — R059 Cough, unspecified: Secondary | ICD-10-CM | POA: Diagnosis present

## 2023-10-02 DIAGNOSIS — N390 Urinary tract infection, site not specified: Secondary | ICD-10-CM

## 2023-10-02 DIAGNOSIS — R0602 Shortness of breath: Secondary | ICD-10-CM | POA: Insufficient documentation

## 2023-10-02 DIAGNOSIS — F0390 Unspecified dementia without behavioral disturbance: Secondary | ICD-10-CM | POA: Diagnosis not present

## 2023-10-02 DIAGNOSIS — R0981 Nasal congestion: Secondary | ICD-10-CM | POA: Diagnosis not present

## 2023-10-02 DIAGNOSIS — J069 Acute upper respiratory infection, unspecified: Secondary | ICD-10-CM

## 2023-10-02 LAB — CBC WITH DIFFERENTIAL/PLATELET
Abs Immature Granulocytes: 0.05 10*3/uL (ref 0.00–0.07)
Basophils Absolute: 0 10*3/uL (ref 0.0–0.1)
Basophils Relative: 0 %
Eosinophils Absolute: 0.1 10*3/uL (ref 0.0–0.5)
Eosinophils Relative: 1 %
HCT: 39.9 % (ref 36.0–46.0)
Hemoglobin: 12.4 g/dL (ref 12.0–15.0)
Immature Granulocytes: 0 %
Lymphocytes Relative: 24 %
Lymphs Abs: 2.6 10*3/uL (ref 0.7–4.0)
MCH: 29.1 pg (ref 26.0–34.0)
MCHC: 31.1 g/dL (ref 30.0–36.0)
MCV: 93.7 fL (ref 80.0–100.0)
Monocytes Absolute: 1 10*3/uL (ref 0.1–1.0)
Monocytes Relative: 9 %
Neutro Abs: 7.4 10*3/uL (ref 1.7–7.7)
Neutrophils Relative %: 66 %
Platelets: 253 10*3/uL (ref 150–400)
RBC: 4.26 MIL/uL (ref 3.87–5.11)
RDW: 13.5 % (ref 11.5–15.5)
WBC: 11.2 10*3/uL — ABNORMAL HIGH (ref 4.0–10.5)
nRBC: 0 % (ref 0.0–0.2)

## 2023-10-02 LAB — URINALYSIS, ROUTINE W REFLEX MICROSCOPIC
Bilirubin Urine: NEGATIVE
Glucose, UA: NEGATIVE mg/dL
Hgb urine dipstick: NEGATIVE
Ketones, ur: NEGATIVE mg/dL
Leukocytes,Ua: NEGATIVE
Nitrite: POSITIVE — AB
Protein, ur: NEGATIVE mg/dL
Specific Gravity, Urine: 1.023 (ref 1.005–1.030)
pH: 6 (ref 5.0–8.0)

## 2023-10-02 LAB — COMPREHENSIVE METABOLIC PANEL
ALT: 11 U/L (ref 0–44)
AST: 14 U/L — ABNORMAL LOW (ref 15–41)
Albumin: 3.6 g/dL (ref 3.5–5.0)
Alkaline Phosphatase: 48 U/L (ref 38–126)
Anion gap: 9 (ref 5–15)
BUN: 31 mg/dL — ABNORMAL HIGH (ref 8–23)
CO2: 26 mmol/L (ref 22–32)
Calcium: 9.3 mg/dL (ref 8.9–10.3)
Chloride: 108 mmol/L (ref 98–111)
Creatinine, Ser: 0.71 mg/dL (ref 0.44–1.00)
GFR, Estimated: >60 mL/min (ref >60)
Glucose, Bld: 111 mg/dL — ABNORMAL HIGH (ref 70–99)
Potassium: 3.8 mmol/L (ref 3.5–5.1)
Sodium: 143 mmol/L (ref 135–145)
Total Bilirubin: 0.6 mg/dL (ref 0.0–1.2)
Total Protein: 6.5 g/dL (ref 6.5–8.1)

## 2023-10-02 LAB — RESP PANEL BY RT-PCR (RSV, FLU A&B, COVID)  RVPGX2
Influenza A by PCR: NEGATIVE
Influenza B by PCR: NEGATIVE
Resp Syncytial Virus by PCR: NEGATIVE
SARS Coronavirus 2 by RT PCR: NEGATIVE

## 2023-10-02 MED ORDER — CEPHALEXIN 500 MG PO CAPS: 500.0000 mg | ORAL_CAPSULE | Freq: Four times a day (QID) | ORAL | 0 refills | Status: DC

## 2023-10-02 NOTE — ED Triage Notes (Signed)
 BIB EMS from group home. Pt has had cough for 2 weeks Pt is nonambulatory on baseline, baseline GCS is 8, pt is intermittently nonverbal, intellectual disability and dementia. Flu is going around the group home

## 2023-10-02 NOTE — ED Provider Notes (Signed)
 [place] EMERGENCY DEPARTMENT AT [facility] Provider Note   CSN: [id number] Arrival date & time: 10/02/23  1419     History  Chief Complaint  Patient presents with   Cough    Sherri Gross is a 78 y.o. female.  78 year old female who presents from group home due to increased cough congestion for several days.  Patient has a history of dementia and IDD and is nonverbal and is nonambulatory.  Reportedly there is flu going around the group home where she is at.  Caregiver at bedside states that she has not had any fever or vomiting.  They note that she has had decreased activity which prompted the visit today       Home Medications Prior to Admission medications   Medication Sig Start Date End Date Taking? Authorizing Provider  amLODipine (NORVASC) 2.5 MG tablet Take 2.5 mg by mouth at bedtime.     [provider]  Cholecalciferol (VITAMIN D3) 2000 units TABS Take 2 capsules by mouth daily.    [provider]  fenofibrate (TRICOR) 145 MG tablet Take 145 mg by mouth daily.    [provider]  memantine (NAMENDA) 10 MG tablet Take 10 mg by mouth 2 (two) times daily.    [provider]  tizanidine (ZANAFLEX) 2 MG capsule Take 2 mg by mouth as directed. Take on MWF & Sat    [provider]  Vaginal Lubricant (REPLENS) GEL Place 1 applicator vaginally once a week. Every Mon    [provider]  vitamin B-12 (CYANOCOBALAMIN) 1000 MCG tablet Take 1,000 mcg by mouth daily.    [provider]      Allergies    Galantamine and Tuberculin ppd    Review of Systems   Review of Systems  Unable to perform ROS: Dementia    Physical Exam Updated Vital Signs BP (!) 149/77 (BP Location: Right Arm)   Pulse 79   Temp 98.3 F (36.8 C) (Oral)   Resp 18   Ht 1.651 m (5\' 5" )   Wt 55 kg   SpO2 98%   BMI 20.18 kg/m  Physical Exam Vitals and nursing note reviewed.  Constitutional:      General: She is not in  acute distress.    Appearance: Normal appearance. She is well-developed. She is not toxic-appearing.  HENT:     Head: Normocephalic and atraumatic.  Eyes:     General: Lids are normal.     Conjunctiva/sclera: Conjunctivae normal.     Pupils: Pupils are equal, round, and reactive to light.  Neck:     Thyroid: No thyroid mass.     Trachea: No tracheal deviation.  Cardiovascular:     Rate and Rhythm: Normal rate and regular rhythm.     Heart sounds: Normal heart sounds. No murmur heard.    No gallop.  Pulmonary:     Effort: Pulmonary effort is normal. No respiratory distress.     Breath sounds: Normal breath sounds. No stridor. No decreased breath sounds, wheezing, rhonchi or rales.  Abdominal:     General: There is no distension.     Palpations: Abdomen is soft.     Tenderness: There is no abdominal tenderness. There is no rebound.  Musculoskeletal:        General: No tenderness. Normal range of motion.     Cervical back: Normal range of motion and neck supple.  Skin:    General: Skin is warm and dry.  Findings: No abrasion or rash.  Neurological:     Mental Status: Mental status is at baseline. She is lethargic and disoriented.     Comments: Patient withdraws to pain in all 4 extremities Per caregiver at bedside, patient is at her baseline  Psychiatric:        Attention and Perception: She is inattentive.     ED Results / Procedures / Treatments   Labs (all labs ordered are listed, but only abnormal results are displayed) Labs Reviewed  RESP PANEL BY RT-PCR (RSV, FLU A&B, COVID)  [id number]  CBC WITH DIFFERENTIAL/PLATELET  COMPREHENSIVE METABOLIC PANEL  URINALYSIS, ROUTINE W REFLEX MICROSCOPIC    EKG None  Radiology No results found.  Procedures Procedures    Medications Ordered in ED Medications - No data to display  ED Course/ Medical Decision Making/ A&P                                 Medical Decision Making Amount and/or Complexity of Data  Reviewed Labs: ordered. Radiology: ordered.   Patient's chest x-ray without acute findings here.  Viral panel negative as well.  Urinalysis with questional UTI.  No evidence of urosepsis.  Will place patient on antibiotics and discharge        Final Clinical Impression(s) / ED Diagnoses Final diagnoses:  None    Rx / DC Orders ED Discharge Orders     None         Lorre Nick, MD 10/02/23 2006

## 2024-06-21 ENCOUNTER — Emergency Department (HOSPITAL_COMMUNITY)

## 2024-06-21 ENCOUNTER — Emergency Department (HOSPITAL_COMMUNITY)
Admission: EM | Admit: 2024-06-21 | Discharge: 2024-06-21 | Disposition: A | Discharge status: Home / Self Care | Attending: Emergency Medicine | Admitting: Emergency Medicine

## 2024-06-21 ENCOUNTER — Encounter (HOSPITAL_COMMUNITY): Payer: Self-pay

## 2024-06-21 ENCOUNTER — Other Ambulatory Visit: Payer: Self-pay

## 2024-06-21 DIAGNOSIS — I10 Essential (primary) hypertension: Secondary | ICD-10-CM | POA: Insufficient documentation

## 2024-06-21 DIAGNOSIS — R0989 Other specified symptoms and signs involving the circulatory and respiratory systems: Secondary | ICD-10-CM | POA: Insufficient documentation

## 2024-06-21 DIAGNOSIS — R0981 Nasal congestion: Secondary | ICD-10-CM | POA: Insufficient documentation

## 2024-06-21 DIAGNOSIS — F0390 Unspecified dementia without behavioral disturbance: Secondary | ICD-10-CM | POA: Diagnosis not present

## 2024-06-21 DIAGNOSIS — Z79899 Other long term (current) drug therapy: Secondary | ICD-10-CM | POA: Diagnosis not present

## 2024-06-21 DIAGNOSIS — J3489 Other specified disorders of nose and nasal sinuses: Secondary | ICD-10-CM | POA: Diagnosis not present

## 2024-06-21 LAB — RESP PANEL BY RT-PCR (RSV, FLU A&B, COVID)  RVPGX2
Influenza A by PCR: NEGATIVE
Influenza B by PCR: NEGATIVE
Resp Syncytial Virus by PCR: NEGATIVE
SARS Coronavirus 2 by RT PCR: NEGATIVE

## 2024-06-21 MED ORDER — CETIRIZINE HCL 5 MG PO TABS: 5.0000 mg | ORAL_TABLET | Freq: Every day | ORAL | 0 refills | Status: DC

## 2024-06-21 NOTE — Discharge Instructions (Signed)
 Today you were seen for a runny nose.  I suspect this is likely due to allergies.  Please pick up your Zyrtec and take as directed.  Thank you for letting us  treat you today. After reviewing your labs and imaging, I feel you are safe to go home. Please follow up with your PCP in the next several days and provide them with your records from this visit. Return to the Emergency Room if pain becomes severe or symptoms worsen.

## 2024-06-21 NOTE — ED Notes (Signed)
 [facility] called

## 2024-06-21 NOTE — ED Provider Notes (Signed)
 [place] EMERGENCY DEPARTMENT AT [facility] Provider Note   CSN: [id number] Arrival date & time: 06/21/24  1506     Patient presents with: Nasal Congestion   Sherri Gross is a 78 y.o. female past medical history significant for dementia, memory loss, spastic hemiparesis, hearing loss, and hypertension presents today from [facility] for possible pneumonia.  Patient has had rhinorrhea for 4 months and staff is concerned that she may have pneumonia because of this.  No other symptoms or changes in behavior noted.  Patient is nonverbal and has contracted limbs at baseline.   HPI     Prior to Admission medications   Medication Sig Start Date End Date Taking? Authorizing Provider  cetirizine (ZYRTEC) 5 MG tablet Take 1 tablet (5 mg total) by mouth daily. 06/21/24  Yes Latavia Goga N, PA-C  amLODipine  (NORVASC ) 2.5 MG tablet Take 2.5 mg by mouth at bedtime.     [provider]  cephALEXin  (KEFLEX ) 500 MG capsule Take 1 capsule (500 mg total) by mouth 4 (four) times daily. 10/02/23   Dasie Faden, MD  Cholecalciferol  (VITAMIN D3) 2000 units TABS Take 2 capsules by mouth daily.    [provider]  fenofibrate  (TRICOR ) 145 MG tablet Take 145 mg by mouth daily.    [provider]  memantine  (NAMENDA ) 10 MG tablet Take 10 mg by mouth 2 (two) times daily.    [provider]  tizanidine (ZANAFLEX) 2 MG capsule Take 2 mg by mouth as directed. Take on MWF & Sat    [provider]  Vaginal Lubricant (REPLENS) GEL Place 1 applicator vaginally once a week. Every Mon    [provider]  vitamin B-12 (CYANOCOBALAMIN ) 1000 MCG tablet Take 1,000 mcg by mouth daily.    [provider]    Allergies: Galantamine and Tuberculin ppd    Review of Systems  HENT:  Positive for rhinorrhea.     Updated Vital Signs BP (!) 160/82 (BP Location: Left Arm)   Pulse 69   Temp 98.7 F (37.1 C) (Oral)   Resp 20   SpO2 96%    Physical Exam Vitals and nursing note reviewed.  Constitutional:      General: She is not in acute distress.    Appearance: She is well-developed. She is not toxic-appearing.  HENT:     Head: Normocephalic and atraumatic.     Right Ear: External ear normal.     Left Ear: External ear normal.     Nose: Congestion and rhinorrhea present.  Eyes:     Conjunctiva/sclera: Conjunctivae normal.  Cardiovascular:     Rate and Rhythm: Normal rate and regular rhythm.     Pulses: Normal pulses.     Heart sounds: Normal heart sounds. No murmur heard. Pulmonary:     Effort: Pulmonary effort is normal. No respiratory distress.     Breath sounds: Normal breath sounds.     Comments: Sporadic rhonchi Abdominal:     Palpations: Abdomen is soft.     Tenderness: There is no abdominal tenderness.  Musculoskeletal:        General: No swelling.     Cervical back: Neck supple.     Comments: Limbs contracted  Skin:    General: Skin is warm and dry.     Capillary Refill: Capillary refill takes less than 2 seconds.  Neurological:     Mental Status: She is alert. Mental status is at baseline.  Psychiatric:  Mood and Affect: Mood normal.     (all labs ordered are listed, but only abnormal results are displayed) Labs Reviewed  RESP PANEL BY RT-PCR (RSV, FLU A&B, COVID)  [id number]    EKG: None  Radiology: [facility] Chest Port 1 View Result Date: 06/21/2024 CLINICAL DATA:  Possible pneumonia. EXAM: PORTABLE CHEST 1 VIEW COMPARISON:  10/02/2023. FINDINGS: Cardiac silhouette is normal in size. No convincing mediastinal or hilar masses. Elevated right hemidiaphragm. Lungs essentially clear. No convincing pleural effusion or pneumothorax. Skeletal structures are diffusely demineralized, grossly intact. IMPRESSION: No acute cardiopulmonary disease. Electronically Signed   By: Alm Parkins M.D.   On: 06/21/2024 16:16     Procedures   Medications Ordered in the ED - No data to display                                   Medical Decision Making Amount and/or Complexity of Data Reviewed Radiology: ordered.   This patient presents to the ED for concern of rhinorrhea differential diagnosis includes COVID, flu, RSV, seasonal allergies, viral URI, pneumonia    Additional history obtained   Additional history obtained from Electronic Medical Record External records from outside source obtained and reviewed including Care Everywhere   Lab Tests:  I Ordered, and personally interpreted labs.  The pertinent results include: Negative respiratory panel   Imaging Studies ordered:  I ordered imaging studies including chest x-ray I independently visualized and interpreted imaging which showed no acute cardiopulmonary disease I agree with the radiologist interpretation   Problem List / ED Course:  Considered for admission or further workup however patient's vital signs, physical exam, labs, and imaging are reassuring. Patient is afebrile, without cough or other signs of acute bacterial infection. Patient's symptoms likely due to environmental allergies.  Patient started on daily Zyrtec.  Patient given return precautions.  I feel patient safe for discharge at this time.      Final diagnoses:  Runny nose    ED Discharge Orders          Ordered    cetirizine (ZYRTEC) 5 MG tablet  Daily        06/21/24 1703               Francis Ileana SAILOR, PA-C 06/21/24 1705    Dean Clarity, MD 06/21/24 1744    Dean Clarity, MD 06/21/24 1747

## 2024-06-21 NOTE — ED Triage Notes (Signed)
 Pt BIBA from [facility], sent out with c/o possible pnuemonia due to runny nose.  Per staff it has started 4 months ago. Nonverbal at baseline. Pt limbs contracted at baseline. VSS.    O2 97 RA CBG 97

## 2024-07-29 ENCOUNTER — Inpatient Hospital Stay (HOSPITAL_COMMUNITY)
Admission: EM | Admit: 2024-07-29 | Discharge: 2024-08-07 | DRG: 280 | Source: Skilled Nursing Facility | Attending: Internal Medicine | Admitting: Internal Medicine

## 2024-07-29 ENCOUNTER — Emergency Department (HOSPITAL_COMMUNITY)

## 2024-07-29 ENCOUNTER — Encounter (HOSPITAL_COMMUNITY): Payer: Self-pay

## 2024-07-29 ENCOUNTER — Other Ambulatory Visit: Payer: Self-pay

## 2024-07-29 DIAGNOSIS — Z66 Do not resuscitate: Secondary | ICD-10-CM | POA: Diagnosis present

## 2024-07-29 DIAGNOSIS — H9190 Unspecified hearing loss, unspecified ear: Secondary | ICD-10-CM | POA: Diagnosis present

## 2024-07-29 DIAGNOSIS — E7800 Pure hypercholesterolemia, unspecified: Secondary | ICD-10-CM | POA: Diagnosis present

## 2024-07-29 DIAGNOSIS — F0390 Unspecified dementia without behavioral disturbance: Secondary | ICD-10-CM | POA: Diagnosis present

## 2024-07-29 DIAGNOSIS — Z794 Long term (current) use of insulin: Secondary | ICD-10-CM

## 2024-07-29 DIAGNOSIS — B9789 Other viral agents as the cause of diseases classified elsewhere: Secondary | ICD-10-CM | POA: Diagnosis present

## 2024-07-29 DIAGNOSIS — R0902 Hypoxemia: Principal | ICD-10-CM

## 2024-07-29 DIAGNOSIS — J984 Other disorders of lung: Secondary | ICD-10-CM | POA: Diagnosis present

## 2024-07-29 DIAGNOSIS — E872 Acidosis, unspecified: Secondary | ICD-10-CM | POA: Diagnosis present

## 2024-07-29 DIAGNOSIS — Z515 Encounter for palliative care: Secondary | ICD-10-CM

## 2024-07-29 DIAGNOSIS — I214 Non-ST elevation (NSTEMI) myocardial infarction: Secondary | ICD-10-CM

## 2024-07-29 DIAGNOSIS — E440 Moderate protein-calorie malnutrition: Secondary | ICD-10-CM | POA: Diagnosis present

## 2024-07-29 DIAGNOSIS — G119 Hereditary ataxia, unspecified: Secondary | ICD-10-CM | POA: Diagnosis present

## 2024-07-29 DIAGNOSIS — E119 Type 2 diabetes mellitus without complications: Secondary | ICD-10-CM | POA: Diagnosis present

## 2024-07-29 DIAGNOSIS — J690 Pneumonitis due to inhalation of food and vomit: Principal | ICD-10-CM | POA: Diagnosis present

## 2024-07-29 DIAGNOSIS — E876 Hypokalemia: Secondary | ICD-10-CM | POA: Diagnosis present

## 2024-07-29 DIAGNOSIS — R7989 Other specified abnormal findings of blood chemistry: Secondary | ICD-10-CM | POA: Diagnosis present

## 2024-07-29 DIAGNOSIS — Z79899 Other long term (current) drug therapy: Secondary | ICD-10-CM

## 2024-07-29 DIAGNOSIS — E870 Hyperosmolality and hypernatremia: Secondary | ICD-10-CM | POA: Diagnosis not present

## 2024-07-29 DIAGNOSIS — I959 Hypotension, unspecified: Secondary | ICD-10-CM | POA: Diagnosis present

## 2024-07-29 DIAGNOSIS — J9601 Acute respiratory failure with hypoxia: Secondary | ICD-10-CM | POA: Diagnosis present

## 2024-07-29 DIAGNOSIS — I10 Essential (primary) hypertension: Secondary | ICD-10-CM | POA: Diagnosis present

## 2024-07-29 DIAGNOSIS — F79 Unspecified intellectual disabilities: Secondary | ICD-10-CM | POA: Diagnosis present

## 2024-07-29 LAB — I-STAT VENOUS BLOOD GAS, ED
Acid-base deficit: 7 mmol/L — ABNORMAL HIGH (ref 0.0–2.0)
Bicarbonate: 21.7 mmol/L (ref 20.0–28.0)
Calcium, Ion: 1.23 mmol/L (ref 1.15–1.40)
HCT: 40 % (ref 36.0–46.0)
Hemoglobin: 13.6 g/dL (ref 12.0–15.0)
O2 Saturation: 98 %
Potassium: 3.4 mmol/L — ABNORMAL LOW (ref 3.5–5.1)
Sodium: 145 mmol/L (ref 135–145)
TCO2: 23 mmol/L (ref 22–32)
pCO2, Ven: 54.9 mmHg (ref 44–60)
pH, Ven: 7.206 — ABNORMAL LOW (ref 7.25–7.43)
pO2, Ven: 121 mmHg — ABNORMAL HIGH (ref 32–45)

## 2024-07-29 LAB — CBC WITH DIFFERENTIAL/PLATELET
Abs Immature Granulocytes: 0.1 K/uL — ABNORMAL HIGH (ref 0.00–0.07)
Basophils Absolute: 0 K/uL (ref 0.0–0.1)
Basophils Relative: 0 %
Eosinophils Absolute: 0 K/uL (ref 0.0–0.5)
Eosinophils Relative: 0 %
HCT: 42.6 % (ref 36.0–46.0)
Hemoglobin: 13.3 g/dL (ref 12.0–15.0)
Immature Granulocytes: 1 %
Lymphocytes Relative: 9 %
Lymphs Abs: 1.4 K/uL (ref 0.7–4.0)
MCH: 29.7 pg (ref 26.0–34.0)
MCHC: 31.2 g/dL (ref 30.0–36.0)
MCV: 95.1 fL (ref 80.0–100.0)
Monocytes Absolute: 0.7 K/uL (ref 0.1–1.0)
Monocytes Relative: 5 %
Neutro Abs: 13 K/uL — ABNORMAL HIGH (ref 1.7–7.7)
Neutrophils Relative %: 85 %
Platelets: 224 K/uL (ref 150–400)
RBC: 4.48 MIL/uL (ref 3.87–5.11)
RDW: 14.6 % (ref 11.5–15.5)
WBC: 15.2 K/uL — ABNORMAL HIGH (ref 4.0–10.5)
nRBC: 0 % (ref 0.0–0.2)

## 2024-07-29 NOTE — ED Triage Notes (Signed)
 Patient from a facility with complaints of patient found unconscious in bed. Patient was awake when EMS arrived. Patient was on non rebreather when EMS arrived. It was left on to run. Patient on 12 L non re breather sating fine. Patient has a hx dementia and nonverbal. Hx of diabetes. No treatment prior to arrival. Not normally on O2. Has no illness know of recently.   Systolic of 104/80 97%  80-90 HR Bgl 312

## 2024-07-29 NOTE — ED Provider Notes (Signed)
 " [place] EMERGENCY DEPARTMENT AT [facility] Provider Note   CSN: [id number] Arrival date & time: 07/29/24  2242     Patient presents with: Respiratory Distress and Loss of Consciousness   Sherri Gross is a 79 y.o. female.    Patient is a 79 y/o female with hx of dementia, memory loss, spastic hemiparesis, hearing loss, and hypertension who arrives via EMS from the [facility]. Patient is nonverbal at baseline. EMS report they were called out due to the patient being unconscious; she was conscious upon their arrival. Patient noted to be on a NRB at her facility without known hx of chronic O2 requirement. EMS maintained 12L via NRB during transport. No known illness recently.  The history is provided by the EMS personnel and medical records.       Prior to Admission medications  Medication Sig Start Date End Date Taking? Authorizing Provider  amLODipine  (NORVASC ) 2.5 MG tablet Take 2.5 mg by mouth at bedtime.     [provider]  cephALEXin  (KEFLEX ) 500 MG capsule Take 1 capsule (500 mg total) by mouth 4 (four) times daily. 10/02/23   Dasie Faden, MD  cetirizine  (ZYRTEC ) 5 MG tablet Take 1 tablet (5 mg total) by mouth daily. 06/21/24   Keith, Kayla N, PA-C  Cholecalciferol  (VITAMIN D3) 2000 units TABS Take 2 capsules by mouth daily.    [provider]  fenofibrate  (TRICOR ) 145 MG tablet Take 145 mg by mouth daily.    [provider]  memantine  (NAMENDA ) 10 MG tablet Take 10 mg by mouth 2 (two) times daily.    [provider]  tizanidine (ZANAFLEX) 2 MG capsule Take 2 mg by mouth as directed. Take on MWF & Sat    [provider]  Vaginal Lubricant (REPLENS) GEL Place 1 applicator vaginally once a week. Every Mon    [provider]  vitamin B-12 (CYANOCOBALAMIN ) 1000 MCG tablet Take 1,000 mcg by mouth daily.    [provider]    Allergies: Galantamine and Tuberculin ppd    Review of Systems Ten  systems reviewed and are negative for acute change, except as noted in the HPI.    Updated Vital Signs BP 103/72   Pulse 63   Temp 98.3 F (36.8 C) (Rectal)   Resp 16   Ht 5' 5 (1.651 m)   Wt 65.8 kg   SpO2 100%   BMI 24.13 kg/m   Physical Exam Vitals and nursing note reviewed.  Constitutional:      General: She is not in acute distress.    Appearance: She is well-developed. She is not diaphoretic.     Comments: Chronically ill appearing. Eyes closed. Faint grunting to loud voice and physical stimulation; eyes remain closed.  HENT:     Head: Normocephalic and atraumatic.  Eyes:     General: No scleral icterus.    Conjunctiva/sclera: Conjunctivae normal.     Comments: PERRL  Cardiovascular:     Rate and Rhythm: Normal rate and regular rhythm.     Pulses: Normal pulses.  Pulmonary:     Effort: Pulmonary effort is normal. No respiratory distress.     Comments: Mild tachypnea without dyspnea. Sats 97-98% on 4L via [place]. Moving air well on auscultation. No rales or rhonchi noted. Abdominal:     General: There is no distension.     Palpations: Abdomen is soft. There is no mass.     Comments: Soft, nondistended. No guarding.  Musculoskeletal:  General: Normal range of motion.     Cervical back: Normal range of motion.  Skin:    General: Skin is warm and dry.     Coloration: Skin is not pale.     Findings: No erythema or rash.  Neurological:     Comments: Contracted extremities  Psychiatric:        Behavior: Behavior normal.     (all labs ordered are listed, but only abnormal results are displayed) Labs Reviewed  CBC WITH DIFFERENTIAL/PLATELET - Abnormal; Notable for the following components:      Result Value   WBC 15.2 (*)    Neutro Abs 13.0 (*)    Abs Immature Granulocytes 0.10 (*)    All other components within normal limits  BASIC METABOLIC PANEL WITH GFR - Abnormal; Notable for the following components:   CO2 20 (*)    Glucose, Bld 208 (*)    BUN 31  (*)    Calcium 8.8 (*)    Anion gap 16 (*)    All other components within normal limits  I-STAT VENOUS BLOOD GAS, ED - Abnormal; Notable for the following components:   pH, Ven 7.206 (*)    pO2, Ven 121 (*)    Acid-base deficit 7.0 (*)    Potassium 3.4 (*)    All other components within normal limits  I-STAT CG4 LACTIC ACID, ED - Abnormal; Notable for the following components:   Lactic Acid, Venous 2.7 (*)    All other components within normal limits  I-STAT CG4 LACTIC ACID, ED - Abnormal; Notable for the following components:   Lactic Acid, Venous 2.1 (*)    All other components within normal limits  TROPONIN T, HIGH SENSITIVITY - Abnormal; Notable for the following components:   Troponin T High Sensitivity 365 (*)    All other components within normal limits  TROPONIN T, HIGH SENSITIVITY - Abnormal; Notable for the following components:   Troponin T High Sensitivity 578 (*)    All other components within normal limits  RESP PANEL BY RT-PCR (RSV, FLU A&B, COVID)  RVPGX2  CULTURE, BLOOD (ROUTINE X 2)  CULTURE, BLOOD (ROUTINE X 2)  PRO BRAIN NATRIURETIC PEPTIDE  I-STAT VENOUS BLOOD GAS, ED    EKG: EKG Interpretation Date/Time:  Tuesday July 30 2024 00:09:46 EST Ventricular Rate:  69 PR Interval:  136 QRS Duration:  107 QT Interval:  450 QTC Calculation: 483 R Axis:   -4  Text Interpretation: Sinus rhythm Borderline T abnormalities, inferior leads Confirmed by Bari Pfeiffer ([id number]) on 07/30/2024 1:19:07 AM  Radiology: CT Angio Chest PE W and/or Wo Contrast Result Date: 07/30/2024 EXAM: CTA CHEST 07/30/2024 02:18:16 AM TECHNIQUE: CTA of the chest was performed with the administration of 75 mL of intravenous contrast (iohexol  (OMNIPAQUE ) 350 MG/ML injection). Multiplanar reformatted images are provided for review. MIP images are provided for review. Automated exposure control, iterative reconstruction, and/or weight based adjustment of the mA/kV was utilized to reduce the  radiation dose to as low as reasonably achievable. COMPARISON: Comparison is made with portable chest 07/29/2024, 06/21/2024, and 10/02/2023. No prior cross-sectional imaging for comparison. CLINICAL HISTORY: Pulmonary embolism (PE) suspected, high prob. Patient presents with hypoxia. FINDINGS: PULMONARY ARTERIES: The pulmonary arteries are normal caliber without evidence of thromboemboli. Main pulmonary artery is normal in caliber. MEDIASTINUM: There is mild panchamber cardiomegaly with a slight left chamber predominance with prominent central pulmonary veins. There is a tortuous thoracic aorta with mild patchy calcific plaques, scattered calcification in the great vessels  without aneurysm, stenosis, or dissection. No pericardial effusion. LYMPH NODES: No mediastinal, hilar or axillary lymphadenopathy. LUNGS AND PLEURA: There are trace pleural effusions. No pneumothorax. There is mild interstitial edema centrally and in the upper lobes. The lungs are expiratory with a chronically elevated right hemidiaphragm. There are patchy ground-glass opacities in the upper lobes consistent with ground glass edema and/or pneumonitis. There is posterior atelectasis in the lower lobes, additional subsegmental atelectasis along the domes of the diaphragm. UPPER ABDOMEN: No acute abnormality is seen in the upper abdomen. The gallbladder is absent. SOFT TISSUES AND BONES: There is kyphosis and degenerative change of the thoracic spine, osteopenia. 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 No acute bone or soft tissue abnormality. IMPRESSION: 1. No evidence of pulmonary embolism. 2. Cardiomegaly with central venous distention, Mild interstitial edema centrally and in the upper lobes, with patchy ground-glass upper lobe opacities consistent with edema and/or pneumonitis. Findings compatible with chf or fluid  overload, underlying pneumonitis not excluded. . . 3. Trace pleural effusions. Electronically signed by: Francis Quam MD 07/30/2024 02:41 AM EST RP Workstation: [id number]   CT HEAD WO CONTRAST ( ) Result Date: 07/30/2024 EXAM: CT HEAD WITHOUT 07/30/2024 12:34:10 AM TECHNIQUE: CT of the head was performed without the administration of intravenous contrast. Automated exposure control, iterative reconstruction, and/or weight based adjustment of the mA/kV was utilized to reduce the radiation dose to as low as reasonably achievable. COMPARISON: 12/08/2020 CLINICAL HISTORY: Mental status change, unknown cause. FINDINGS: BRAIN AND VENTRICLES: No acute intracranial hemorrhage. No mass effect or midline shift. No extra-axial fluid collection. Old right thalamic lacunar infarct. No hydrocephalus. ORBITS: There is bulging and thinning of the lateral wall of the right ethmoid air cells into the medial right orbit with mass effect on the medial rectus muscle. SINUSES AND MASTOIDS: Complete opacification of the right frontal sinus and ethmoid air cells. Mucosal thickening in the right maxillary sinus. There is bulging and thinning of the lateral wall of the right ethmoid air cells into the medial right orbit with mass effect on the medial rectus muscle. SOFT TISSUES AND SKULL: No acute skull fracture. No acute soft tissue abnormality. IMPRESSION: 1. No acute intracranial abnormality. 2. Complete opacification of the right frontal sinus and ethmoid air cells with mucosal thickening in the right maxillary sinus .findings compatible with chronic sinusitis . Bulging and thinning of the lateral wall of the right ethmoid air cells into the medial right orbit and mass effect on the medial rectus muscle. Electronically signed by: Franky Crease MD 07/30/2024 12:49 AM EST RP Workstation: [id number]   DG Chest Port 1 View Result Date: 07/29/2024 EXAM: 1 VIEW(S) XRAY OF THE CHEST 07/29/2024 11:48:00 PM COMPARISON: 06/21/2024 CLINICAL  HISTORY: hypoxia FINDINGS: LUNGS AND PLEURA: Left basilar opacity with silhouetting of the left hemidiaphragm. Elevated right hemidiaphragm with bowel gaseous distention under the right hemidiaphragm. No pneumothorax. HEART AND MEDIASTINUM: Atherosclerotic calcifications of the aortic arch. BONES AND SOFT TISSUES: Degenerative changes of the left shoulder. No acute osseous abnormality. IMPRESSION: 1. Left basilar atelectasis or infiltrate. 2. Elevated right hemidiaphragm with subjacent bowel gaseous distention. Electronically signed by: Greig Pique MD 07/29/2024 11:52 PM EST RP Workstation: [id number]     .Critical Care  Performed by: Keith Sor, PA-C Authorized by: Keith Sor, PA-C   Critical care provider statement:    Critical care time (minutes):  30   Critical care time was exclusive of:  Separately billable  procedures and treating other patients   Critical care was necessary to treat or prevent imminent or life-threatening deterioration of the following conditions:  Respiratory failure   Critical care was time spent personally by me on the following activities:  Development of treatment plan with patient or surrogate, discussions with consultants, evaluation of patient's response to treatment, examination of patient, ordering and review of laboratory studies, ordering and review of radiographic studies, ordering and performing treatments and interventions, pulse oximetry, re-evaluation of patient's condition, review of old charts and obtaining history from patient or surrogate   I assumed direction of critical care for this patient from another provider in my specialty: no     Care discussed with: admitting provider      Medications Ordered in the ED  vancomycin  (VANCOREADY) IVPB 1500 mg/300 mL (1,500 mg Intravenous New Bag/Given 07/30/24 0237)  lactated ringers  bolus 1,000 mL (0 mLs Intravenous Stopped 07/30/24 0135)  ceFEPIme  (MAXIPIME ) 2 g in sodium chloride  0.9 % 100 mL IVPB (0 g  Intravenous Stopped 07/30/24 0215)  iohexol  (OMNIPAQUE ) 350 MG/ML injection 75 mL (75 mLs Intravenous Contrast Given 07/30/24 0218)    Clinical Course as of 07/30/24 0420  Mon Jul 29, 2024  2314 Patient taken off 15L NRB. Oxygen dropped from 100% down to 91% at which time patient was placed back on supplemental O2 via [place]. This was titrated to 4L with improvement in saturations to 97-98%. Also attempted to reposition the patient as she is hunched forward in the bed with persistent neck flexion. This was only mildly successful as patient will not relax enough to lay supine in the bed. [KH]  2316 [facility] to obtain collateral information. Person I spoke with stated the evening shift nurses have gone home and he just arrived at work. Reports he did not receive any information on the patient after assuming her care and is unaware of what sent her to the ED. [KH]  Tue Jul 30, 2024  0025 There is a education administrator member at bedside.  She was not with the patient at the time EMS was called, but does report that she has been sick and look like she was having a harder time taking a deep breath earlier tonight.  Spoke with staff supervisor, Mickeal [phone]), who requests updates on disposition to make staff changes if necessary as patient will need a nurse at bedside with her if she is admitted. [KH]  [id number] Patient is now with eyes open. She is alert, looking around the room. Sats stable on 4L via [place]. [KH]  0028 Received phone number for Ginger [phone]) who is a merchandiser, retail at the [facility]. She was contacted by nursing tonight about the patient.  Tabitha reports that when staff when in to change and reposition patient tonight, patient appeared to have a hard time breathing. Patient was allegedly laying on her face. She was repositioned by staff without improvement to lip and face color. Per staff, patient wasn't feeling well over the last few days. Ginger states there has been some  flu-like illness at the facility. [KH]  0252 Attempted to call number listed for legal guardian ([name] Redd) x2.  Received recording that number has been changed or disconnected. [KH]  0314 Tabitha and Mehkiya updated on patient's disposition and plan for admission. No access to updated contact information of legal guardian.  Pending call back from hospitalist. [KH]  0315 Troponin 365 > 578. EKG nonischemic. Suspect demand in the setting of hypoxia of unknown duration  prior to arrival. [KH]  0413 Spoke with Dr. Alfornia of [facility]. AM hospitalist to assess patient for admission. Requesting cardiology consultation given troponin elevation. Cardiology paged. Will start on heparin  gtt pending AM cardiology assessment in the setting of NSTEMI with patient being nonverbal. [KH]  [phone] Spoke with Dr. Cesario of Cardiology. Agrees with heparin  gtt pending consultation later this AM. No further recommendations at this time. [KH]    Clinical Course User Index [KH] Keith Sor, PA-C                                 Medical Decision Making Amount and/or Complexity of Data Reviewed Labs: ordered. Radiology: ordered. ECG/medicine tests: ordered.  Risk Prescription drug management. Decision regarding hospitalization.   This patient presents to the ED for concern of SOB, this involves an extensive number of treatment options, and is a complaint that carries with it a high risk of complications and morbidity.  The differential diagnosis includes viral illness vs PNA vs PTX vs CHF exacerbation vs COPD exacerbation vs PE vs ACS   Co morbidities that complicate the patient evaluation  Dementia Spastic hemiparesis   Additional history obtained:  Additional history obtained from EMS personnel External records from outside source obtained and reviewed including prior discharge summaries   Lab Tests:  I Ordered, and personally interpreted labs.  The pertinent results include:  WBC 15.2 w/left shift, CO2  20, Glucose 208. Lactic 2.1, Venous pH 7.206, Troponin 365 > 578   Imaging Studies ordered:  I ordered imaging studies including CXR and CTA PE study as well as CT head  I independently visualized and interpreted imaging which showed CTA findings c/w CHF vs pneumonitis. No acute process on head CT. I agree with the radiologist interpretation   Cardiac Monitoring:  The patient was maintained on a cardiac monitor.  I personally viewed and interpreted the cardiac monitored which showed an underlying rhythm of: NSR   Medicines ordered and prescription drug management:  I ordered medication including Vancomycin  and Cefepime  for sepsis coverage given leukocytosis, hypotension  Reevaluation of the patient after these medicines showed that the patient remained stable I have reviewed the patients home medicines and have made adjustments as needed   Test Considered:  D dimer - opted for CTA    Critical Interventions:  Supplemental O2 IV abx for sepsis coverage IV Heparin  gtt for NSTEMI   Consultations Obtained:  I requested consultation with Dr. Cesario of Cardiology and discussed lab and imaging findings as well as pertinent plan - they recommend: heparin  gtt pending formal consultation later this AM.   Problem List / ED Course:  As above   Reevaluation:  After the interventions noted above, I reevaluated the patient and found that they have :stayed the same   Social Determinants of Health:  Nonverbal    Dispostion:  After consideration of the diagnostic results and the patients response to treatment, I feel that the patent would benefit from admission for further evaluation of hypoxia.  Given IV antibiotics on arrival to cover for sepsis.  Also started on heparin  drip given NSTEMI.  EKG without signs of acute ischemia.  Cardiology to follow in consultation.  Hospitalist to admit.      Final diagnoses:  Hypoxemia  NSTEMI (non-ST elevated myocardial infarction) [facility])     ED Discharge Orders     None          Keith Sor, PA-C  07/30/24 [id number]    Bari Charmaine FALCON, MD 07/30/24 [phone]  "

## 2024-07-30 ENCOUNTER — Emergency Department (HOSPITAL_COMMUNITY)

## 2024-07-30 ENCOUNTER — Encounter (HOSPITAL_COMMUNITY): Payer: Self-pay | Admitting: Hospitalist

## 2024-07-30 ENCOUNTER — Other Ambulatory Visit: Payer: Self-pay

## 2024-07-30 ENCOUNTER — Inpatient Hospital Stay (HOSPITAL_COMMUNITY)

## 2024-07-30 DIAGNOSIS — I10 Essential (primary) hypertension: Secondary | ICD-10-CM | POA: Diagnosis present

## 2024-07-30 DIAGNOSIS — Z515 Encounter for palliative care: Secondary | ICD-10-CM | POA: Diagnosis not present

## 2024-07-30 DIAGNOSIS — F79 Unspecified intellectual disabilities: Secondary | ICD-10-CM | POA: Diagnosis present

## 2024-07-30 DIAGNOSIS — Z7189 Other specified counseling: Secondary | ICD-10-CM | POA: Diagnosis not present

## 2024-07-30 DIAGNOSIS — E876 Hypokalemia: Secondary | ICD-10-CM | POA: Diagnosis present

## 2024-07-30 DIAGNOSIS — E870 Hyperosmolality and hypernatremia: Secondary | ICD-10-CM | POA: Diagnosis not present

## 2024-07-30 DIAGNOSIS — E7800 Pure hypercholesterolemia, unspecified: Secondary | ICD-10-CM | POA: Diagnosis present

## 2024-07-30 DIAGNOSIS — I214 Non-ST elevation (NSTEMI) myocardial infarction: Secondary | ICD-10-CM

## 2024-07-30 DIAGNOSIS — J9601 Acute respiratory failure with hypoxia: Secondary | ICD-10-CM | POA: Diagnosis present

## 2024-07-30 DIAGNOSIS — R0902 Hypoxemia: Secondary | ICD-10-CM | POA: Diagnosis present

## 2024-07-30 DIAGNOSIS — I959 Hypotension, unspecified: Secondary | ICD-10-CM | POA: Diagnosis present

## 2024-07-30 DIAGNOSIS — E119 Type 2 diabetes mellitus without complications: Secondary | ICD-10-CM | POA: Diagnosis present

## 2024-07-30 DIAGNOSIS — B9789 Other viral agents as the cause of diseases classified elsewhere: Secondary | ICD-10-CM | POA: Diagnosis present

## 2024-07-30 DIAGNOSIS — H9190 Unspecified hearing loss, unspecified ear: Secondary | ICD-10-CM | POA: Diagnosis present

## 2024-07-30 DIAGNOSIS — G119 Hereditary ataxia, unspecified: Secondary | ICD-10-CM | POA: Diagnosis present

## 2024-07-30 DIAGNOSIS — Z66 Do not resuscitate: Secondary | ICD-10-CM | POA: Diagnosis present

## 2024-07-30 DIAGNOSIS — Z79899 Other long term (current) drug therapy: Secondary | ICD-10-CM | POA: Diagnosis not present

## 2024-07-30 DIAGNOSIS — Z794 Long term (current) use of insulin: Secondary | ICD-10-CM | POA: Diagnosis not present

## 2024-07-30 DIAGNOSIS — R7989 Other specified abnormal findings of blood chemistry: Secondary | ICD-10-CM | POA: Diagnosis present

## 2024-07-30 DIAGNOSIS — J984 Other disorders of lung: Secondary | ICD-10-CM | POA: Diagnosis present

## 2024-07-30 DIAGNOSIS — F0390 Unspecified dementia without behavioral disturbance: Secondary | ICD-10-CM | POA: Diagnosis present

## 2024-07-30 DIAGNOSIS — J690 Pneumonitis due to inhalation of food and vomit: Secondary | ICD-10-CM | POA: Diagnosis present

## 2024-07-30 DIAGNOSIS — E872 Acidosis, unspecified: Secondary | ICD-10-CM | POA: Diagnosis present

## 2024-07-30 DIAGNOSIS — E440 Moderate protein-calorie malnutrition: Secondary | ICD-10-CM | POA: Diagnosis present

## 2024-07-30 LAB — RESPIRATORY PANEL BY PCR

## 2024-07-30 LAB — BASIC METABOLIC PANEL WITH GFR
Anion gap: 16 — ABNORMAL HIGH (ref 5–15)
BUN: 31 mg/dL — ABNORMAL HIGH (ref 8–23)
CO2: 20 mmol/L — ABNORMAL LOW (ref 22–32)
Calcium: 8.8 mg/dL — ABNORMAL LOW (ref 8.9–10.3)
Chloride: 107 mmol/L (ref 98–111)
Creatinine, Ser: 0.76 mg/dL (ref 0.44–1.00)
GFR, Estimated: >60 mL/min
Glucose, Bld: 208 mg/dL — ABNORMAL HIGH (ref 70–99)
Potassium: 3.7 mmol/L (ref 3.5–5.1)
Sodium: 142 mmol/L (ref 135–145)

## 2024-07-30 LAB — I-STAT VENOUS BLOOD GAS, ED
Acid-base deficit: 4 mmol/L — ABNORMAL HIGH (ref 0.0–2.0)
Bicarbonate: 23.6 mmol/L (ref 20.0–28.0)
Calcium, Ion: 1.23 mmol/L (ref 1.15–1.40)
HCT: 39 % (ref 36.0–46.0)
Hemoglobin: 13.3 g/dL (ref 12.0–15.0)
O2 Saturation: 89 %
Potassium: 3.6 mmol/L (ref 3.5–5.1)
Sodium: 143 mmol/L (ref 135–145)
TCO2: 25 mmol/L (ref 22–32)
pCO2, Ven: 52.3 mmHg (ref 44–60)
pH, Ven: 7.262 (ref 7.25–7.43)
pO2, Ven: 65 mmHg — ABNORMAL HIGH (ref 32–45)

## 2024-07-30 LAB — HEPARIN LEVEL (UNFRACTIONATED): Heparin Unfractionated: >1.1 [IU]/mL — ABNORMAL HIGH (ref 0.30–0.70)

## 2024-07-30 LAB — RESP PANEL BY RT-PCR (RSV, FLU A&B, COVID)  RVPGX2
Influenza A by PCR: NEGATIVE
Influenza B by PCR: NEGATIVE
Resp Syncytial Virus by PCR: NEGATIVE
SARS Coronavirus 2 by RT PCR: NEGATIVE

## 2024-07-30 LAB — TROPONIN T, HIGH SENSITIVITY
Troponin T High Sensitivity: 365 ng/L (ref 0–19)
Troponin T High Sensitivity: 578 ng/L (ref 0–19)

## 2024-07-30 LAB — I-STAT CG4 LACTIC ACID, ED
Lactic Acid, Venous: 2.1 mmol/L (ref 0.5–1.9)
Lactic Acid, Venous: 2.7 mmol/L (ref 0.5–1.9)

## 2024-07-30 LAB — PRO BRAIN NATRIURETIC PEPTIDE: Pro Brain Natriuretic Peptide: 255 pg/mL

## 2024-07-30 MED ORDER — HEPARIN (PORCINE) 25000 UT/250ML-% IV SOLN
950.0000 [IU]/h | INTRAVENOUS | Status: DC
  Administered 2024-07-30: 950 [IU]/h via INTRAVENOUS
  Filled 2024-07-30: qty 250

## 2024-07-30 MED ORDER — ENOXAPARIN SODIUM 40 MG/0.4ML IJ SOSY: 40.0000 mg | PREFILLED_SYRINGE | INTRAMUSCULAR | Status: DC

## 2024-07-30 MED ORDER — SODIUM CHLORIDE 0.9 % IV SOLN
1.0000 g | INTRAVENOUS | Status: DC
  Administered 2024-07-30: 1 g via INTRAVENOUS
  Filled 2024-07-30: qty 10

## 2024-07-30 MED ORDER — SODIUM CHLORIDE 0.9 % IV SOLN
500.0000 mg | INTRAVENOUS | Status: DC
  Administered 2024-07-30: 500 mg via INTRAVENOUS
  Filled 2024-07-30 (×2): qty 5

## 2024-07-30 MED ORDER — VANCOMYCIN HCL 1500 MG/300ML IV SOLN
1500.0000 mg | Freq: Once | INTRAVENOUS | Status: AC
  Administered 2024-07-30: 1500 mg via INTRAVENOUS
  Filled 2024-07-30: qty 300

## 2024-07-30 MED ORDER — LACTATED RINGERS IV BOLUS
1000.0000 mL | Freq: Once | INTRAVENOUS | Status: AC
  Administered 2024-07-30: 1000 mL via INTRAVENOUS

## 2024-07-30 MED ORDER — HEPARIN (PORCINE) 25000 UT/250ML-% IV SOLN
800.0000 [IU]/h | INTRAVENOUS | Status: DC
  Administered 2024-07-30: 800 [IU]/h via INTRAVENOUS

## 2024-07-30 MED ORDER — IOHEXOL 350 MG/ML SOLN
75.0000 mL | Freq: Once | INTRAVENOUS | Status: AC | PRN
  Administered 2024-07-30: 75 mL via INTRAVENOUS

## 2024-07-30 MED ORDER — SODIUM CHLORIDE 0.9 % IV SOLN
2.0000 g | Freq: Once | INTRAVENOUS | Status: AC
  Administered 2024-07-30: 2 g via INTRAVENOUS
  Filled 2024-07-30: qty 12.5

## 2024-07-30 MED ORDER — HEPARIN BOLUS VIA INFUSION
3900.0000 [IU] | Freq: Once | INTRAVENOUS | Status: AC
  Administered 2024-07-30: 3900 [IU] via INTRAVENOUS
  Filled 2024-07-30: qty 3900

## 2024-07-30 NOTE — Plan of Care (Signed)
" °  Problem: Education: Goal: Knowledge of General Education information will improve Description: Including pain rating scale, medication(s)/side effects and non-pharmacologic comfort measures 07/30/2024 1607 by Emil Roselie SAUNDERS, RN Outcome: Progressing 07/30/2024 1607 by Emil Roselie SAUNDERS, RN Outcome: Progressing   Problem: Health Behavior/Discharge Planning: Goal: Ability to manage health-related needs will improve 07/30/2024 1607 by Emil Roselie SAUNDERS, RN Outcome: Progressing 07/30/2024 1607 by Emil Roselie SAUNDERS, RN Outcome: Progressing   Problem: Clinical Measurements: Goal: Ability to maintain clinical measurements within normal limits will improve 07/30/2024 1607 by Emil Roselie SAUNDERS, RN Outcome: Progressing 07/30/2024 1607 by Emil Roselie SAUNDERS, RN Outcome: Progressing Goal: Will remain free from infection 07/30/2024 1607 by Emil Roselie SAUNDERS, RN Outcome: Progressing 07/30/2024 1607 by Emil Roselie SAUNDERS, RN Outcome: Progressing Goal: Diagnostic test results will improve 07/30/2024 1607 by Emil Roselie SAUNDERS, RN Outcome: Progressing 07/30/2024 1607 by Emil Roselie SAUNDERS, RN Outcome: Progressing Goal: Respiratory complications will improve 07/30/2024 1607 by Emil Roselie SAUNDERS, RN Outcome: Progressing 07/30/2024 1607 by Emil Roselie SAUNDERS, RN Outcome: Progressing Goal: Cardiovascular complication will be avoided 07/30/2024 1607 by Emil Roselie SAUNDERS, RN Outcome: Progressing 07/30/2024 1607 by Emil Roselie SAUNDERS, RN Outcome: Progressing   Problem: Activity: Goal: Risk for activity intolerance will decrease 07/30/2024 1607 by Emil Roselie SAUNDERS, RN Outcome: Progressing 07/30/2024 1607 by Emil Roselie SAUNDERS, RN Outcome: Progressing   Problem: Nutrition: Goal: Adequate nutrition will be maintained 07/30/2024 1607 by Emil Roselie SAUNDERS, RN Outcome: Progressing 07/30/2024 1607 by Emil Roselie SAUNDERS, RN Outcome: Progressing   Problem: Coping: Goal: Level of anxiety will decrease 07/30/2024  1607 by Emil Roselie SAUNDERS, RN Outcome: Progressing 07/30/2024 1607 by Emil Roselie SAUNDERS, RN Outcome: Progressing   Problem: Elimination: Goal: Will not experience complications related to bowel motility 07/30/2024 1607 by Emil Roselie SAUNDERS, RN Outcome: Progressing 07/30/2024 1607 by Emil Roselie SAUNDERS, RN Outcome: Progressing Goal: Will not experience complications related to urinary retention 07/30/2024 1607 by Emil Roselie SAUNDERS, RN Outcome: Progressing 07/30/2024 1607 by Emil Roselie SAUNDERS, RN Outcome: Progressing   Problem: Pain Managment: Goal: General experience of comfort will improve and/or be controlled 07/30/2024 1607 by Emil Roselie SAUNDERS, RN Outcome: Progressing 07/30/2024 1607 by Emil Roselie SAUNDERS, RN Outcome: Progressing   Problem: Safety: Goal: Ability to remain free from injury will improve 07/30/2024 1607 by Emil Roselie SAUNDERS, RN Outcome: Progressing 07/30/2024 1607 by Emil Roselie SAUNDERS, RN Outcome: Progressing   Problem: Skin Integrity: Goal: Risk for impaired skin integrity will decrease 07/30/2024 1607 by Emil Roselie SAUNDERS, RN Outcome: Progressing 07/30/2024 1607 by Emil Roselie SAUNDERS, RN Outcome: Progressing   "

## 2024-07-30 NOTE — ED Notes (Signed)
 Charge RN of 6E called about transporting of pt.

## 2024-07-30 NOTE — Progress Notes (Signed)
 PHARMACY - ANTICOAGULATION CONSULT NOTE  Pharmacy Consult for heparin  Indication: chest pain/ACS  Allergies[1]  Patient Measurements: Height: 5' 5 (165.1 cm) Weight: 65.8 kg (145 lb) IBW/kg (Calculated) : 57 HEPARIN  DW (KG): 65.8  Vital Signs: Temp: 98.1 F (36.7 C) (01/06 1313) Temp Source: Axillary (01/06 1313) BP: 160/81 (01/06 1526) Pulse Rate: 79 (01/06 1122)  Labs: Recent Labs    07/29/24 2259 07/29/24 2323 07/30/24 0408 07/30/24 1508  HGB 13.3 13.6 13.3  --   HCT 42.6 40.0 39.0  --   PLT 224  --   --   --   HEPARINUNFRC  --   --   --  >1.10*  CREATININE 0.76  --   --   --     Estimated Creatinine Clearance: 51.3 mL/min (by C-G formula based on SCr of 0.76 mg/dL).   Medical History: Past Medical History:  Diagnosis Date   Arthritis    Cataracts, bilateral    Cerebellar ataxia (HCC)    Dementia (HCC)    Hearing loss    Hypercholesteremia    Hypertension    IDDM (insulin dependent diabetes mellitus)    Osteoarthritis    Presbyopia    Psychotic disorder (HCC)    Assessment: 89 yoF presented after being found unconscious. Pharmacy consulted to dose heparin  for ACS.  -CBC WNL -No PTA anticoagulation  -Trop 578  Heparin  level came back elevated. It was collected appropriately. No bleeding per RN. We will hold and adjust rate.   Goal of Therapy:  Heparin  level 0.3-0.7 units/ml Monitor platelets by anticoagulation protocol: Yes   Plan:  Hold heparin  x1 hr Decrease rate to 800units/hr Check 8 hr HL  Sergio Batch, PharmD, [place], AAHIVP, CPP Infectious Disease Pharmacist 07/30/2024 4:37 PM         [1]  Allergies Allergen Reactions   Galantamine     Unknown per mar   Tuberculin Ppd     unknown

## 2024-07-30 NOTE — H&P (Signed)
 " History and Physical    Patient: Sherri Gross FMW:[id number] DOB: 1946-01-19 DOA: 07/29/2024 DOS: the patient was seen and examined on 07/30/2024 PCP: Hilma Philis HERO  Patient coming from: SNF  Chief Complaint:  Chief Complaint  Patient presents with   Respiratory Distress   Loss of Consciousness   HPI: Sherri Gross is a 79 y.o. female with medical history significant of dementia (mute at baseline per RN report), hearing loss, HTN, HLD, DM2 and remote psychosis who p/w AHRF iso elevated troponin.  Pt is unable to provide any medical history. From what I can gather per Epic review, pt was BIBA from nursing home with reported mental status change and found to have new O2 requirement iso elevated troponin.  In the ED, pt hypotensive (82/48), and tachypneic w/ hypoxia (NRB 12L/min to 3L [place]) on arrival. Labs notable for VBG pH/CO2 7.206/54.9, WBC 15.2, and troponin 365>578. Lactic acid 2.7>2.1. CT chest showed cardiomegaly with central venous distention, mild interstitial edema centrally and in the upper lobes, with patchy ground-glass upper lobe opacities consistent with edema and/or pneumonitis. EDP consulted cards who requested hep gtt, and medicine admission.   Review of Systems: As mentioned in the history of present illness. All other systems reviewed and are negative. Past Medical History:  Diagnosis Date   Arthritis    Cataracts, bilateral    Cerebellar ataxia (HCC)    Dementia (HCC)    Hearing loss    Hypercholesteremia    Hypertension    IDDM (insulin dependent diabetes mellitus)    Osteoarthritis    Presbyopia    Psychotic disorder (HCC)    Past Surgical History:  Procedure Laterality Date   denies surgical history     Social History:  reports that she has an unknown smoking status. She has never used smokeless tobacco. She reports that she does not drink alcohol and does not use drugs.  Allergies[1]  History reviewed. No pertinent family history.  Prior to  Admission medications  Medication Sig Start Date End Date Taking? Authorizing Provider  amLODipine  (NORVASC ) 2.5 MG tablet Take 2.5 mg by mouth at bedtime.     [provider]  cephALEXin  (KEFLEX ) 500 MG capsule Take 1 capsule (500 mg total) by mouth 4 (four) times daily. 10/02/23   Dasie Faden, MD  cetirizine  (ZYRTEC ) 5 MG tablet Take 1 tablet (5 mg total) by mouth daily. 06/21/24   Keith, Kayla N, PA-C  Cholecalciferol  (VITAMIN D3) 2000 units TABS Take 2 capsules by mouth daily.    [provider]  fenofibrate  (TRICOR ) 145 MG tablet Take 145 mg by mouth daily.    [provider]  memantine  (NAMENDA ) 10 MG tablet Take 10 mg by mouth 2 (two) times daily.    [provider]  tizanidine (ZANAFLEX) 2 MG capsule Take 2 mg by mouth as directed. Take on MWF & Sat    [provider]  Vaginal Lubricant (REPLENS) GEL Place 1 applicator vaginally once a week. Every Mon    [provider]  vitamin B-12 (CYANOCOBALAMIN ) 1000 MCG tablet Take 1,000 mcg by mouth daily.    [provider]    Physical Exam: Vitals:   07/30/24 0300 07/30/24 0400 07/30/24 0632 07/30/24 0730  BP: 127/73 103/72  125/70  Pulse: 69 63  72  Resp: 17 16  15   Temp:   97.7 F (36.5 C)   TempSrc:   Axillary   SpO2: 100% 100%  100%  Weight:  Height:       General: Alert, oriented x3, resting comfortably in no acute distress Respiratory: Lungs clear to auscultation bilaterally with normal respiratory effort; no w/r/r Cardiovascular: Regular rate and rhythm w/o m/r/g Abdomen: Soft, nontender, nondistended. Positive bowel sounds   Data Reviewed:  Lab Results  Component Value Date   WBC 15.2 (H) 07/29/2024   HGB 13.3 07/30/2024   HCT 39.0 07/30/2024   MCV 95.1 07/29/2024   PLT 224 07/29/2024   Lab Results  Component Value Date   GLUCOSE 208 (H) 07/29/2024   CALCIUM 8.8 (L) 07/29/2024   NA 143 07/30/2024   K 3.6 07/30/2024   CO2 20 (L) 07/29/2024    CL 107 07/29/2024   BUN 31 (H) 07/29/2024   CREATININE 0.76 07/29/2024   Lab Results  Component Value Date   ALT 11 10/02/2023   AST 14 (L) 10/02/2023   ALKPHOS 48 10/02/2023   BILITOT 0.6 10/02/2023   No results found for: INR, PROTIME Radiology: CT Angio Chest PE W and/or Wo Contrast Result Date: 07/30/2024 EXAM: CTA CHEST 07/30/2024 02:18:16 AM TECHNIQUE: CTA of the chest was performed with the administration of 75 mL of intravenous contrast (iohexol  (OMNIPAQUE ) 350 MG/ML injection). Multiplanar reformatted images are provided for review. MIP images are provided for review. Automated exposure control, iterative reconstruction, and/or weight based adjustment of the mA/kV was utilized to reduce the radiation dose to as low as reasonably achievable. COMPARISON: Comparison is made with portable chest 07/29/2024, 06/21/2024, and 10/02/2023. No prior cross-sectional imaging for comparison. CLINICAL HISTORY: Pulmonary embolism (PE) suspected, high prob. Patient presents with hypoxia. FINDINGS: PULMONARY ARTERIES: The pulmonary arteries are normal caliber without evidence of thromboemboli. Main pulmonary artery is normal in caliber. MEDIASTINUM: There is mild panchamber cardiomegaly with a slight left chamber predominance with prominent central pulmonary veins. There is a tortuous thoracic aorta with mild patchy calcific plaques, scattered calcification in the great vessels without aneurysm, stenosis, or dissection. No pericardial effusion. LYMPH NODES: No mediastinal, hilar or axillary lymphadenopathy. LUNGS AND PLEURA: There are trace pleural effusions. No pneumothorax. There is mild interstitial edema centrally and in the upper lobes. The lungs are expiratory with a chronically elevated right hemidiaphragm. There are patchy ground-glass opacities in the upper lobes consistent with ground glass edema and/or pneumonitis. There is posterior atelectasis in the lower lobes, additional subsegmental  atelectasis along the domes of the diaphragm. UPPER ABDOMEN: No acute abnormality is seen in the upper abdomen. The gallbladder is absent. SOFT TISSUES AND BONES: There is kyphosis and degenerative change of the thoracic spine, osteopenia. 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 No acute bone or soft tissue abnormality. IMPRESSION: 1. No evidence of pulmonary embolism. 2. Cardiomegaly with central venous distention, Mild interstitial edema centrally and in the upper lobes, with patchy ground-glass upper lobe opacities consistent with edema and/or pneumonitis. Findings compatible with chf or fluid overload, underlying pneumonitis not excluded. . . 3. Trace pleural effusions. Electronically signed by: Francis Quam MD 07/30/2024 02:41 AM EST RP Workstation: [id number]   CT HEAD WO CONTRAST ( ) Result Date: 07/30/2024 EXAM: CT HEAD WITHOUT 07/30/2024 12:34:10 AM TECHNIQUE: CT of the head was performed without the administration of intravenous contrast. Automated exposure control, iterative reconstruction, and/or weight based adjustment of the mA/kV was utilized to reduce the radiation dose to as low as reasonably achievable. COMPARISON: 12/08/2020 CLINICAL HISTORY: Mental status change, unknown cause. FINDINGS: BRAIN AND VENTRICLES:  No acute intracranial hemorrhage. No mass effect or midline shift. No extra-axial fluid collection. Old right thalamic lacunar infarct. No hydrocephalus. ORBITS: There is bulging and thinning of the lateral wall of the right ethmoid air cells into the medial right orbit with mass effect on the medial rectus muscle. SINUSES AND MASTOIDS: Complete opacification of the right frontal sinus and ethmoid air cells. Mucosal thickening in the right maxillary sinus. There is bulging and thinning of the lateral wall of the right ethmoid air cells into the medial right orbit  with mass effect on the medial rectus muscle. SOFT TISSUES AND SKULL: No acute skull fracture. No acute soft tissue abnormality. IMPRESSION: 1. No acute intracranial abnormality. 2. Complete opacification of the right frontal sinus and ethmoid air cells with mucosal thickening in the right maxillary sinus .findings compatible with chronic sinusitis . Bulging and thinning of the lateral wall of the right ethmoid air cells into the medial right orbit and mass effect on the medial rectus muscle. Electronically signed by: Franky Crease MD 07/30/2024 12:49 AM EST RP Workstation: [id number]   DG Chest Port 1 View Result Date: 07/29/2024 EXAM: 1 VIEW(S) XRAY OF THE CHEST 07/29/2024 11:48:00 PM COMPARISON: 06/21/2024 CLINICAL HISTORY: hypoxia FINDINGS: LUNGS AND PLEURA: Left basilar opacity with silhouetting of the left hemidiaphragm. Elevated right hemidiaphragm with bowel gaseous distention under the right hemidiaphragm. No pneumothorax. HEART AND MEDIASTINUM: Atherosclerotic calcifications of the aortic arch. BONES AND SOFT TISSUES: Degenerative changes of the left shoulder. No acute osseous abnormality. IMPRESSION: 1. Left basilar atelectasis or infiltrate. 2. Elevated right hemidiaphragm with subjacent bowel gaseous distention. Electronically signed by: Greig Pique MD 07/29/2024 11:52 PM EST RP Workstation: [id number]    Assessment and Plan: 47F h/o dementia (mute at baseline per RN report), hearing loss, HTN, HLD, DM2 and remote psychosis who p/w AHRF iso NSTEMI.  NSTEMI -Cards consulted; apprec eval/recs (agree with IV heparin  x48h for now)  AHRF Elevated lactic acidosis Leukocytosis Presumed CAP CT chest w/ RUL pneumonitis vs aspiration -SLP consulted; apprec eval/recs -IV CTX 1g daily to complete 5 day CAP course -IV azithromycin  500mg  daily to complete 3 day CAP course -Duonebs prn -Wean O2 as tolerated -Ambulatory pulse ox prior to d/c -F/u complete RVP (pending)  HTN -HOLD pta amlodipine   2.5mg  daily for now given hypotension on arrival    Advance Care Planning:   Code Status: Full Code   Consults: Cards  Family Communication: Sister, Ted  Severity of Illness: The appropriate patient status for this patient is INPATIENT. Inpatient status is judged to be reasonable and necessary in order to provide the required intensity of service to ensure the patient's safety. The patient's presenting symptoms, physical exam findings, and initial radiographic and laboratory data in the context of their chronic comorbidities is felt to place them at high risk for further clinical deterioration. Furthermore, it is not anticipated that the patient will be medically stable for discharge from the hospital within 2 midnights of admission.   * I certify that at the point of admission it is my clinical judgment that the patient will require inpatient hospital care spanning beyond 2 midnights from the point of admission due to high intensity of service, high risk for further deterioration and high frequency of surveillance required.*   ------- I spent 56 minutes reviewing previous notes, at the bedside counseling/discussing the treatment plan, and performing clinical documentation.  Author: Marsha Ada, MD 07/30/2024 8:32 AM  For on call review [email or web address].      [1]  Allergies Allergen Reactions   Galantamine     Unknown per mar   Tuberculin Ppd     unknown   "

## 2024-07-30 NOTE — Evaluation (Signed)
 Clinical/Bedside Swallow Evaluation Patient Details  Name: Sherri Gross MRN: [id number] Date of Birth: 10-21-1945  Today's Date: 07/30/2024 Time: SLP Start Time (ACUTE ONLY): 1650 SLP Stop Time (ACUTE ONLY): 1700 SLP Time Calculation (min) (ACUTE ONLY): 10 min  Past Medical History:  Past Medical History:  Diagnosis Date   Arthritis    Cataracts, bilateral    Cerebellar ataxia (HCC)    Dementia (HCC)    Hearing loss    Hypercholesteremia    Hypertension    IDDM (insulin dependent diabetes mellitus)    Osteoarthritis    Presbyopia    Psychotic disorder (HCC)    Past Surgical History:  Past Surgical History:  Procedure Laterality Date   denies surgical history     HPI:  79 yo female presenting to ED 1/6 with AMS and new supplemental O2 requirements in the setting to elevated troponin. CT Chest shows resolution of hazy patchy bilateral ground-glass opacification suggesting resolved interstitial edema with stable mild cardiomegaly. PMH includes dementia, hearing loss, HTN, HLD, T2DM    Assessment / Plan / Recommendation  Clinical Impression  Cannot definitively rule out intermittent aspiration due to her history of dementia but clinical presentation is reassuring. Resume baseline diet of Dys 2 solids with thin liquids and meds crushed in puree. Will defer ongoing f/u to pt's next venue of care, signing off acutely.  Per RN, pt's facility reports pt was on Dys 2 diet with thin liquids PTA. Her presentation today is suspected to be similar to baseline with prolonged oral transit of larger, bite-sized solids. She is edentulous but is able to mash simulated chopped solids and clears her oral cavity completely. No signs clinically concerning for aspiration were observed across large, consecutive sips of water.   SLP Visit Diagnosis: Dysphagia, unspecified (R13.10)    Aspiration Risk  Mild aspiration risk    Diet Recommendation           Other Recommendations Oral Care  Recommendations: Oral care BID     Swallow Evaluation Recommendations Recommendations: PO diet PO Diet Recommendation: Dysphagia 2 (Finely chopped);Thin liquids (Level 0) Liquid Administration via: Cup;Straw Medication Administration: Crushed with puree Supervision: Full assist for feeding;Full supervision/cueing for swallowing strategies Swallowing strategies  : Slow rate;Small bites/sips Postural changes: Position pt fully upright for meals Oral care recommendations: Oral care BID (2x/day)   Assistance Recommended at Discharge    Functional Status Assessment Patient has not had a recent decline in their functional status  Frequency and Duration            Prognosis Prognosis for improved oropharyngeal function: Fair Barriers to Reach Goals: Cognitive deficits      Swallow Study   General HPI: 79 yo female presenting to ED 1/6 with AMS and new supplemental O2 requirements in the setting to elevated troponin. CT Chest shows resolution of hazy patchy bilateral ground-glass opacification suggesting resolved interstitial edema with stable mild cardiomegaly. PMH includes dementia, hearing loss, HTN, HLD, T2DM Type of Study: Bedside Swallow Evaluation Previous Swallow Assessment: none in chart Diet Prior to this Study: Regular;Thin liquids (Level 0) Temperature Spikes Noted: No Respiratory Status: Nasal cannula History of Recent Intubation: No Behavior/Cognition: Alert Oral Cavity Assessment: Within Functional Limits Oral Care Completed by SLP: No Oral Cavity - Dentition: Edentulous Vision: Functional for self-feeding Self-Feeding Abilities: Total assist Patient Positioning: Upright in bed Baseline Vocal Quality: Not observed Volitional Cough: Cognitively unable to elicit Volitional Swallow: Unable to elicit    Oral/Motor/Sensory Function Overall Oral Motor/Sensory Function: Within  functional limits   Ice Chips Ice chips: Not tested   Thin Liquid Thin Liquid: Within functional  limits Presentation: Straw    Nectar Thick Nectar Thick Liquid: Not tested   Honey Thick Honey Thick Liquid: Not tested   Puree Puree: Within functional limits Presentation: Spoon   Solid     Solid: Impaired Presentation: Spoon Oral Phase Functional Implications: Prolonged oral transit      Damien Blumenthal, M.A., CCC-SLP Speech Language Pathology, Acute Rehabilitation Services  Secure Chat preferred [phone]  07/30/2024,5:11 PM

## 2024-07-30 NOTE — ED Notes (Signed)
 Came in to patient being soiled w/dried ring. Myself and nurse cleaned her and changed her bed.

## 2024-07-30 NOTE — ED Notes (Signed)
 Attempted secondary line placement, pt is a difficult stick. Iv consult placed.

## 2024-07-30 NOTE — Progress Notes (Signed)
 ED Pharmacy Antibiotic Sign Off An antibiotic consult was received from an ED provider for cefepime /vancomycin  per pharmacy dosing for sepsis. A chart review was completed to assess appropriateness.   The following one time order(s) were placed:   -Cefepime  2g IV x1 -Vancomycin  1500mg  IV x1  Further antibiotic and/or antibiotic pharmacy consults should be ordered by the admitting provider if indicated.   Thank you for allowing pharmacy to be a part of this patient's care.   Lynwood Poplar, [facility]  Clinical Pharmacist 07/30/2024 12:09 AM

## 2024-07-30 NOTE — ED Notes (Signed)
 Phlebotomy asked to stick

## 2024-07-30 NOTE — Progress Notes (Signed)
 PHARMACY - ANTICOAGULATION CONSULT NOTE  Pharmacy Consult for heparin  Indication: chest pain/ACS  Allergies[1]  Patient Measurements: Height: 5' 5 (165.1 cm) Weight: 65.8 kg (145 lb) IBW/kg (Calculated) : 57 HEPARIN  DW (KG): 65.8  Vital Signs: Temp: 98.3 F (36.8 C) (01/06 0045) Temp Source: Rectal (01/06 0045) BP: 103/72 (01/06 0400) Pulse Rate: 63 (01/06 0400)  Labs: Recent Labs    07/29/24 2259 07/29/24 2323  HGB 13.3 13.6  HCT 42.6 40.0  PLT 224  --   CREATININE 0.76  --     Estimated Creatinine Clearance: 51.3 mL/min (by C-G formula based on SCr of 0.76 mg/dL).   Medical History: Past Medical History:  Diagnosis Date   Arthritis    Cataracts, bilateral    Cerebellar ataxia (HCC)    Dementia (HCC)    Hearing loss    Hypercholesteremia    Hypertension    IDDM (insulin dependent diabetes mellitus)    Osteoarthritis    Presbyopia    Psychotic disorder (HCC)    Assessment: 25 yoF presented after being found unconscious. Pharmacy consulted to dose heparin  for ACS.  -CBC WNL -No PTA anticoagulation  -Trop 578  Goal of Therapy:  Heparin  level 0.3-0.7 units/ml Monitor platelets by anticoagulation protocol: Yes   Plan:  Give 3900 units bolus x 1 Start heparin  infusion at 950 units/hr Check anti-Xa level in 8 hours and daily while on heparin  Continue to monitor H&H and platelets F/u cards eval  Lynwood Poplar, PharmD, BCPS Clinical Pharmacist 07/30/2024 4:22 AM        [1]  Allergies Allergen Reactions   Galantamine     Unknown per mar   Tuberculin Ppd     unknown

## 2024-07-30 NOTE — Plan of Care (Signed)
" °  Problem: Education: Goal: Knowledge of General Education information will improve Description: Including pain rating scale, medication(s)/side effects and non-pharmacologic comfort measures 07/30/2024 1818 by Emil Roselie SAUNDERS, RN Outcome: Progressing 07/30/2024 1607 by Emil Roselie SAUNDERS, RN Outcome: Progressing 07/30/2024 1607 by Emil Roselie SAUNDERS, RN Outcome: Progressing   Problem: Health Behavior/Discharge Planning: Goal: Ability to manage health-related needs will improve 07/30/2024 1818 by Emil Roselie SAUNDERS, RN Outcome: Progressing 07/30/2024 1607 by Emil Roselie SAUNDERS, RN Outcome: Progressing 07/30/2024 1607 by Emil Roselie SAUNDERS, RN Outcome: Progressing   Problem: Clinical Measurements: Goal: Ability to maintain clinical measurements within normal limits will improve 07/30/2024 1818 by Emil Roselie SAUNDERS, RN Outcome: Progressing 07/30/2024 1607 by Emil Roselie SAUNDERS, RN Outcome: Progressing 07/30/2024 1607 by Emil Roselie SAUNDERS, RN Outcome: Progressing Goal: Will remain free from infection 07/30/2024 1818 by Emil Roselie SAUNDERS, RN Outcome: Progressing 07/30/2024 1607 by Emil Roselie SAUNDERS, RN Outcome: Progressing 07/30/2024 1607 by Emil Roselie SAUNDERS, RN Outcome: Progressing Goal: Diagnostic test results will improve 07/30/2024 1818 by Emil Roselie SAUNDERS, RN Outcome: Progressing 07/30/2024 1607 by Emil Roselie SAUNDERS, RN Outcome: Progressing 07/30/2024 1607 by Emil Roselie SAUNDERS, RN Outcome: Progressing Goal: Respiratory complications will improve 07/30/2024 1818 by Emil Roselie SAUNDERS, RN Outcome: Progressing 07/30/2024 1607 by Emil Roselie SAUNDERS, RN Outcome: Progressing 07/30/2024 1607 by Emil Roselie SAUNDERS, RN Outcome: Progressing Goal: Cardiovascular complication will be avoided 07/30/2024 1818 by Emil Roselie SAUNDERS, RN Outcome: Progressing 07/30/2024 1607 by Emil Roselie SAUNDERS, RN Outcome: Progressing 07/30/2024 1607 by Emil Roselie SAUNDERS, RN Outcome: Progressing   Problem:  Activity: Goal: Risk for activity intolerance will decrease 07/30/2024 1818 by Emil Roselie SAUNDERS, RN Outcome: Progressing 07/30/2024 1607 by Emil Roselie SAUNDERS, RN Outcome: Progressing 07/30/2024 1607 by Emil Roselie SAUNDERS, RN Outcome: Progressing   Problem: Nutrition: Goal: Adequate nutrition will be maintained 07/30/2024 1818 by Emil Roselie SAUNDERS, RN Outcome: Progressing 07/30/2024 1607 by Emil Roselie SAUNDERS, RN Outcome: Progressing 07/30/2024 1607 by Emil Roselie SAUNDERS, RN Outcome: Progressing   Problem: Coping: Goal: Level of anxiety will decrease 07/30/2024 1818 by Emil Roselie SAUNDERS, RN Outcome: Progressing 07/30/2024 1607 by Emil Roselie SAUNDERS, RN Outcome: Progressing 07/30/2024 1607 by Emil Roselie SAUNDERS, RN Outcome: Progressing   Problem: Elimination: Goal: Will not experience complications related to bowel motility 07/30/2024 1818 by Emil Roselie SAUNDERS, RN Outcome: Progressing 07/30/2024 1607 by Emil Roselie SAUNDERS, RN Outcome: Progressing 07/30/2024 1607 by Emil Roselie SAUNDERS, RN Outcome: Progressing Goal: Will not experience complications related to urinary retention 07/30/2024 1818 by Emil Roselie SAUNDERS, RN Outcome: Progressing 07/30/2024 1607 by Emil Roselie SAUNDERS, RN Outcome: Progressing 07/30/2024 1607 by Emil Roselie SAUNDERS, RN Outcome: Progressing   Problem: Pain Managment: Goal: General experience of comfort will improve and/or be controlled 07/30/2024 1818 by Emil Roselie SAUNDERS, RN Outcome: Progressing 07/30/2024 1607 by Emil Roselie SAUNDERS, RN Outcome: Progressing 07/30/2024 1607 by Emil Roselie SAUNDERS, RN Outcome: Progressing   Problem: Safety: Goal: Ability to remain free from injury will improve 07/30/2024 1818 by Emil Roselie SAUNDERS, RN Outcome: Progressing 07/30/2024 1607 by Emil Roselie SAUNDERS, RN Outcome: Progressing 07/30/2024 1607 by Emil Roselie SAUNDERS, RN Outcome: Progressing   Problem: Skin Integrity: Goal: Risk for impaired skin integrity will decrease 07/30/2024 1818  by Emil Roselie SAUNDERS, RN Outcome: Progressing 07/30/2024 1607 by Emil Roselie SAUNDERS, RN Outcome: Progressing 07/30/2024 1607 by Emil Roselie SAUNDERS, RN Outcome: Progressing   "

## 2024-07-30 NOTE — Consult Note (Signed)
 "  Cardiology Consultation   Patient ID: Sherri Gross MRN: [id number]; DOB: 1946/03/07  Admit date: 07/29/2024 Date of Consult: 07/30/2024  PCP:  [facility] Providers Cardiologist:  None        Patient Profile: Sherri Gross is a 79 y.o. female with a hx of nonverbal dementia who is being seen 07/30/2024 for the evaluation of troponin elevation at the request of ED.  History of Present Illness: [name] brought in from nursing home due to mental status change and new O2 requirement.  She is unable to provide any history personally.  Currently on 4L O2 to maintain sat. Chest CT without PE, some pulmonary edema. Tn went from 365 to 578 Lactate 2.7 with mild hypotension systolic 80s initially, ph 7.26 proBNP 255 WBC elevated at 15   Past Medical History:  Diagnosis Date   Arthritis    Cataracts, bilateral    Cerebellar ataxia (HCC)    Dementia (HCC)    Hearing loss    Hypercholesteremia    Hypertension    IDDM (insulin dependent diabetes mellitus)    Osteoarthritis    Presbyopia    Psychotic disorder (HCC)     Past Surgical History:  Procedure Laterality Date   denies surgical history       Medications: reconciled  Allergies:   Allergies[1]  Social History:   Social History   Socioeconomic History   Marital status: Single    Spouse name: Not on file   Number of children: Not on file   Years of education: Not on file   Highest education level: Not on file  Occupational History   Occupation: disabled  Tobacco Use   Smoking status: Unknown   Smokeless tobacco: Never  Vaping Use   Vaping status: Unknown  Substance and Sexual Activity   Alcohol use: No   Drug use: No   Sexual activity: Not on file  Other Topics Concern   Not on file  Social History Narrative   ** Merged History Encounter **       Lives [facility], [place]  Caffeine use:    Social Drivers of Health   Tobacco Use: Unknown (07/29/2024)    Patient History    Smoking Tobacco Use: Unknown    Smokeless Tobacco Use: Never    Passive Exposure: Not on file  Financial Resource Strain: Not on file  Food Insecurity: Not on file  Transportation Needs: Not on file  Physical Activity: Not on file  Stress: Not on file  Social Connections: Unknown (07/22/2022)   Received from [facility]   Social Network    Social Network: Not on file  Intimate Partner Violence: Unknown (07/22/2022)   Received from [facility]   HITS    Physically Hurt: Not on file    Insult or Talk Down To: Not on file    Threaten Physical Harm: Not on file    Scream or Curse: Not on file  Depression (EYV7-0): Not on file  Alcohol Screen: Not on file  Housing: Not on file  Utilities: Not on file  Health Literacy: Not on file    Family History:   History reviewed. No pertinent family history.   ROS:  Please see the history of present illness.  All other ROS reviewed and negative.     Physical Exam/Data: Vitals:   07/30/24 0030 07/30/24 0045 07/30/24 0300 07/30/24 0400  BP: (!) 112/56  127/73 103/72  Pulse: 76  69 63  Resp: 18  17 16   Temp:  98.3 F (36.8 C)    TempSrc:  Rectal    SpO2: 100%  100% 100%  Weight:      Height:          07/29/2024   10:57 PM 10/02/2023    2:32 PM 03/09/2020    6:36 AM  Last 3 Weights  Weight (lbs) 145 lb 121 lb 4.1 oz 121 lb 4.1 oz  Weight (kg) 65.772 kg 55 kg 55 kg     Body mass index is 24.13 kg/m.  General:  Nonverbal, frail Neck: unable to visualise JVD Vascular: Distal pulses 2+ bilaterally Cardiac:  normal S1, S2; RRR; no murmur Lungs:  clear to auscultation bilaterally, no wheezing, rhonchi or rales  Abd: soft, nontender, bowel sounds present Ext: trace edema Skin: warm and dry   EKG:  The EKG was personally reviewed and demonstrates:  SR with diffusely flat T waves   Laboratory Data: High Sensitivity Troponin:  385 - 578 Normal proBNP  Chemistry Recent Labs  Lab 07/29/24 2259  07/29/24 2323 07/30/24 0408  NA 142 145 143  K 3.7 3.4* 3.6  CL 107  --   --   CO2 20*  --   --   GLUCOSE 208*  --   --   BUN 31*  --   --   CREATININE 0.76  --   --   CALCIUM 8.8*  --   --   GFRNONAA >60  --   --   ANIONGAP 16*  --   --      Hematology Recent Labs  Lab 07/29/24 2259 07/29/24 2323 07/30/24 0408  WBC 15.2*  --   --   RBC 4.48  --   --   HGB 13.3 13.6 13.3  HCT 42.6 40.0 39.0  MCV 95.1  --   --   MCH 29.7  --   --   MCHC 31.2  --   --   RDW 14.6  --   --   PLT 224  --   --    BNP Recent Labs  Lab 07/29/24 2259  PROBNP 255.0    DDimer No results for input(s): DDIMER in the last 168 hours.  Radiology/Studies:  CT Angio Chest PE W and/or Wo Contrast Result Date: 07/30/2024 EXAM: CTA CHEST 07/30/2024 02:18:16 AM TECHNIQUE: CTA of the chest was performed with the administration of 75 mL of intravenous contrast (iohexol  (OMNIPAQUE ) 350 MG/ML injection). Multiplanar reformatted images are provided for review. MIP images are provided for review. Automated exposure control, iterative reconstruction, and/or weight based adjustment of the mA/kV was utilized to reduce the radiation dose to as low as reasonably achievable. COMPARISON: Comparison is made with portable chest 07/29/2024, 06/21/2024, and 10/02/2023. No prior cross-sectional imaging for comparison. CLINICAL HISTORY: Pulmonary embolism (PE) suspected, high prob. Patient presents with hypoxia. FINDINGS: PULMONARY ARTERIES: The pulmonary arteries are normal caliber without evidence of thromboemboli. Main pulmonary artery is normal in caliber. MEDIASTINUM: There is mild panchamber cardiomegaly with a slight left chamber predominance with prominent central pulmonary veins. There is a tortuous thoracic aorta with mild patchy calcific plaques, scattered calcification in the great vessels without aneurysm, stenosis, or dissection. No pericardial effusion. LYMPH NODES: No mediastinal, hilar or axillary lymphadenopathy.  LUNGS AND PLEURA: There are trace pleural effusions. No pneumothorax. There is mild interstitial edema centrally and in the upper lobes. The lungs are expiratory with a chronically elevated right hemidiaphragm. There are patchy  ground-glass opacities in the upper lobes consistent with ground glass edema and/or pneumonitis. There is posterior atelectasis in the lower lobes, additional subsegmental atelectasis along the domes of the diaphragm. UPPER ABDOMEN: No acute abnormality is seen in the upper abdomen. The gallbladder is absent. SOFT TISSUES AND BONES: There is kyphosis and degenerative change of the thoracic spine, osteopenia. 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 No acute bone or soft tissue abnormality. IMPRESSION: 1. No evidence of pulmonary embolism. 2. Cardiomegaly with central venous distention, Mild interstitial edema centrally and in the upper lobes, with patchy ground-glass upper lobe opacities consistent with edema and/or pneumonitis. Findings compatible with chf or fluid overload, underlying pneumonitis not excluded. . . 3. Trace pleural effusions. Electronically signed by: Francis Quam MD 07/30/2024 02:41 AM EST RP Workstation: [id number]   CT HEAD WO CONTRAST ( ) Result Date: 07/30/2024 EXAM: CT HEAD WITHOUT 07/30/2024 12:34:10 AM TECHNIQUE: CT of the head was performed without the administration of intravenous contrast. Automated exposure control, iterative reconstruction, and/or weight based adjustment of the mA/kV was utilized to reduce the radiation dose to as low as reasonably achievable. COMPARISON: 12/08/2020 CLINICAL HISTORY: Mental status change, unknown cause. FINDINGS: BRAIN AND VENTRICLES: No acute intracranial hemorrhage. No mass effect or midline shift. No extra-axial fluid collection. Old right thalamic lacunar infarct. No hydrocephalus. ORBITS: There is  bulging and thinning of the lateral wall of the right ethmoid air cells into the medial right orbit with mass effect on the medial rectus muscle. SINUSES AND MASTOIDS: Complete opacification of the right frontal sinus and ethmoid air cells. Mucosal thickening in the right maxillary sinus. There is bulging and thinning of the lateral wall of the right ethmoid air cells into the medial right orbit with mass effect on the medial rectus muscle. SOFT TISSUES AND SKULL: No acute skull fracture. No acute soft tissue abnormality. IMPRESSION: 1. No acute intracranial abnormality. 2. Complete opacification of the right frontal sinus and ethmoid air cells with mucosal thickening in the right maxillary sinus .findings compatible with chronic sinusitis . Bulging and thinning of the lateral wall of the right ethmoid air cells into the medial right orbit and mass effect on the medial rectus muscle. Electronically signed by: Franky Crease MD 07/30/2024 12:49 AM EST RP Workstation: [id number]   DG Chest Port 1 View Result Date: 07/29/2024 EXAM: 1 VIEW(S) XRAY OF THE CHEST 07/29/2024 11:48:00 PM COMPARISON: 06/21/2024 CLINICAL HISTORY: hypoxia FINDINGS: LUNGS AND PLEURA: Left basilar opacity with silhouetting of the left hemidiaphragm. Elevated right hemidiaphragm with bowel gaseous distention under the right hemidiaphragm. No pneumothorax. HEART AND MEDIASTINUM: Atherosclerotic calcifications of the aortic arch. BONES AND SOFT TISSUES: Degenerative changes of the left shoulder. No acute osseous abnormality. IMPRESSION: 1. Left basilar atelectasis or infiltrate. 2. Elevated right hemidiaphragm with subjacent bowel gaseous distention. Electronically signed by: Greig Pique MD 07/29/2024 11:52 PM EST RP Workstation: [id number]     Assessment and Plan: Elevated troponin, likely NSTEMI, in an elderly lady with poor baseline function. Patient is not a candidate for revascularization.  Favor medical management.  Currently requiring 4L  O2 support, but not much overt volume overload.  Also elevated WBC, consider infectious etiology  Heparinize for 48h in the absence of any bleeding contraindication. Echo to assess LV function. Hold off on any B-blockers or ACEi for now given soft blood pressures and mildly elevated lactate.   Risk Assessment/Risk Scores:    TIMI Risk Score  for Unstable Angina or Non-ST Elevation MI:   The patient's TIMI risk score is 3, which indicates a 13% risk of all cause mortality, new or recurrent myocardial infarction or need for urgent revascularization in the next 14 days.         For questions or updates, please contact [facility] Please consult [email or web address] for contact info under     Signed, Andee Flatten, MD  07/30/2024 6:06 AM     [1]  Allergies Allergen Reactions   Galantamine     Unknown per mar   Tuberculin Ppd     unknown   "

## 2024-07-30 NOTE — ED Notes (Addendum)
 Pt huypotensive at 89/47 PA Humes advised and new orders placed.

## 2024-07-31 ENCOUNTER — Inpatient Hospital Stay (HOSPITAL_COMMUNITY)

## 2024-07-31 DIAGNOSIS — I214 Non-ST elevation (NSTEMI) myocardial infarction: Secondary | ICD-10-CM | POA: Diagnosis not present

## 2024-07-31 LAB — BASIC METABOLIC PANEL WITH GFR
Anion gap: 15 (ref 5–15)
BUN: 13 mg/dL (ref 8–23)
CO2: 21 mmol/L — ABNORMAL LOW (ref 22–32)
Calcium: 8.9 mg/dL (ref 8.9–10.3)
Chloride: 103 mmol/L (ref 98–111)
Creatinine, Ser: 0.52 mg/dL (ref 0.44–1.00)
GFR, Estimated: >60 mL/min
Glucose, Bld: 81 mg/dL (ref 70–99)
Potassium: 3 mmol/L — ABNORMAL LOW (ref 3.5–5.1)
Sodium: 139 mmol/L (ref 135–145)

## 2024-07-31 LAB — ECHOCARDIOGRAM COMPLETE
AV Mean grad: 3 mmHg
AV Peak grad: 4.9 mmHg
Ao pk vel: 1.11 m/s
Area-P 1/2: 4.41 cm2
Calc EF: 69.2 %
Height: 65 in
S' Lateral: 2.7 cm
Single Plane A2C EF: 65.2 %
Single Plane A4C EF: 72.5 %
Weight: 2320 [oz_av]

## 2024-07-31 LAB — GLUCOSE, CAPILLARY: Glucose-Capillary: 82 mg/dL (ref 70–99)

## 2024-07-31 LAB — CBC
HCT: 39 % (ref 36.0–46.0)
Hemoglobin: 13.1 g/dL (ref 12.0–15.0)
MCH: 29.9 pg (ref 26.0–34.0)
MCHC: 33.6 g/dL (ref 30.0–36.0)
MCV: 89 fL (ref 80.0–100.0)
Platelets: 211 K/uL (ref 150–400)
RBC: 4.38 MIL/uL (ref 3.87–5.11)
RDW: 14 % (ref 11.5–15.5)
WBC: 6.2 K/uL (ref 4.0–10.5)
nRBC: 0 % (ref 0.0–0.2)

## 2024-07-31 LAB — TROPONIN T, HIGH SENSITIVITY: Troponin T High Sensitivity: 222 ng/L (ref 0–19)

## 2024-07-31 LAB — MAGNESIUM: Magnesium: 1.6 mg/dL — ABNORMAL LOW (ref 1.7–2.4)

## 2024-07-31 LAB — HEPARIN LEVEL (UNFRACTIONATED)
Heparin Unfractionated: 0.45 [IU]/mL (ref 0.30–0.70)
Heparin Unfractionated: 0.98 [IU]/mL — ABNORMAL HIGH (ref 0.30–0.70)

## 2024-07-31 LAB — LACTIC ACID, PLASMA: Lactic Acid, Venous: 1.3 mmol/L (ref 0.5–1.9)

## 2024-07-31 MED ORDER — HEPARIN (PORCINE) 25000 UT/250ML-% IV SOLN
600.0000 [IU]/h | INTRAVENOUS | Status: DC
  Administered 2024-07-31: 600 [IU]/h via INTRAVENOUS
  Filled 2024-07-31: qty 250

## 2024-07-31 MED ORDER — MAGNESIUM SULFATE 2 GM/50ML IV SOLN
2.0000 g | Freq: Once | INTRAVENOUS | Status: AC
  Administered 2024-07-31: 2 g via INTRAVENOUS
  Filled 2024-07-31: qty 50

## 2024-07-31 MED ORDER — ASPIRIN 81 MG PO CHEW
81.0000 mg | CHEWABLE_TABLET | Freq: Every day | ORAL | Status: DC
  Administered 2024-07-31 – 2024-08-04 (×5): 81 mg via ORAL
  Filled 2024-07-31 (×6): qty 1

## 2024-07-31 MED ORDER — ACETAMINOPHEN 325 MG PO TABS
650.0000 mg | ORAL_TABLET | Freq: Four times a day (QID) | ORAL | Status: DC | PRN
  Administered 2024-07-31 – 2024-08-04 (×4): 650 mg via ORAL
  Filled 2024-07-31 (×4): qty 2

## 2024-07-31 MED ORDER — SODIUM CHLORIDE 0.9 % IV SOLN
2.0000 g | INTRAVENOUS | Status: AC
  Administered 2024-07-31 – 2024-08-03 (×4): 2 g via INTRAVENOUS
  Filled 2024-07-31 (×4): qty 20

## 2024-07-31 MED ORDER — ASPIRIN 81 MG PO TBEC
81.0000 mg | DELAYED_RELEASE_TABLET | Freq: Every day | ORAL | Status: DC
  Filled 2024-07-31: qty 1

## 2024-07-31 MED ORDER — POTASSIUM CHLORIDE CRYS ER 20 MEQ PO TBCR
60.0000 meq | EXTENDED_RELEASE_TABLET | Freq: Once | ORAL | Status: AC
  Administered 2024-07-31: 60 meq via ORAL
  Filled 2024-07-31: qty 3

## 2024-07-31 MED ORDER — AZITHROMYCIN 250 MG PO TABS
500.0000 mg | ORAL_TABLET | Freq: Every day | ORAL | Status: AC
  Administered 2024-07-31 – 2024-08-01 (×2): 500 mg via ORAL
  Filled 2024-07-31 (×2): qty 2

## 2024-07-31 MED ORDER — GLUCERNA SHAKE PO LIQD
237.0000 mL | Freq: Three times a day (TID) | ORAL | Status: DC
  Administered 2024-07-31 – 2024-08-04 (×4): 237 mL via ORAL

## 2024-07-31 MED ORDER — HYDRALAZINE HCL 20 MG/ML IJ SOLN: 10.0000 mg | INTRAMUSCULAR | Status: DC | PRN

## 2024-07-31 MED ORDER — ADULT MULTIVITAMIN W/MINERALS CH
1.0000 | ORAL_TABLET | Freq: Every day | ORAL | Status: AC
  Administered 2024-07-31 – 2024-08-04 (×5): 1 via ORAL
  Filled 2024-07-31 (×6): qty 1

## 2024-07-31 MED ORDER — AMLODIPINE BESYLATE 5 MG PO TABS
5.0000 mg | ORAL_TABLET | Freq: Every day | ORAL | Status: DC
  Administered 2024-07-31: 5 mg via ORAL
  Filled 2024-07-31: qty 1

## 2024-07-31 MED ORDER — HYDRALAZINE HCL 20 MG/ML IJ SOLN
10.0000 mg | Freq: Four times a day (QID) | INTRAMUSCULAR | Status: DC | PRN
  Administered 2024-07-31 – 2024-08-04 (×5): 10 mg via INTRAVENOUS
  Filled 2024-07-31 (×6): qty 1

## 2024-07-31 MED ORDER — ONDANSETRON HCL 4 MG/2ML IJ SOLN
4.0000 mg | Freq: Four times a day (QID) | INTRAMUSCULAR | Status: DC | PRN
  Administered 2024-08-04: 4 mg via INTRAVENOUS
  Filled 2024-07-31: qty 2

## 2024-07-31 MED ORDER — MEMANTINE HCL 10 MG PO TABS
10.0000 mg | ORAL_TABLET | Freq: Two times a day (BID) | ORAL | Status: DC
  Administered 2024-07-31 – 2024-08-04 (×10): 10 mg via ORAL
  Filled 2024-07-31 (×11): qty 1

## 2024-07-31 MED ORDER — THIAMINE MONONITRATE 100 MG PO TABS
100.0000 mg | ORAL_TABLET | Freq: Every day | ORAL | Status: AC
  Administered 2024-07-31 – 2024-08-04 (×5): 100 mg via ORAL
  Filled 2024-07-31 (×6): qty 1

## 2024-07-31 NOTE — Plan of Care (Signed)
  Problem: Clinical Measurements: Goal: Ability to maintain clinical measurements within normal limits will improve Outcome: Progressing   Problem: Pain Managment: Goal: General experience of comfort will improve and/or be controlled Outcome: Progressing   Problem: Safety: Goal: Ability to remain free from injury will improve Outcome: Progressing   Problem: Skin Integrity: Goal: Risk for impaired skin integrity will decrease Outcome: Progressing

## 2024-07-31 NOTE — Evaluation (Signed)
 Physical Therapy Evaluation Patient Details Name: Sherri Gross MRN: [id number] DOB: [birth year] Today's Date: 07/31/2024  History of Present Illness  Pt is a 79 y.o. F presenting to [facility] on 07/29/24 after being found unconscious in bed. IMG shows aortic atherosclerosis and atherosclerotic CAD. Pt admitted with MI. PMH is significant for dementia, intellectual disability, diabetes, HLD, and L spastic hemiparesis.   Clinical Impression  Unable to obtain PLOF from pt as pt is nonverbal, but per pt's sister, pt is from [facility], a group home facility for individuals with intellectual disabilities. Per pt's sister, she is unsure what level of physical assistance pt was needing prior to admission, but reports at baseline pt was requiring physical assistance for all bed mobility and was utilizing a WC for mobility. Pt was total A for rolling and has significant flexion contractures in BUE and BLE. Pillows were positioned under pt's R ischial tube, R scapula, and between pt's knees and medial maleoli to reduce risk of development of pressure sores. Rolled up towels were then placed in pt's hands. PT will continue to treat pt while she is admitted. Anticipate no follow up PT at discharge.       If plan is discharge home, recommend the following: Two people to help with walking and/or transfers;Two people to help with bathing/dressing/bathroom;Direct supervision/assist for medications management;Assist for transportation   Can travel by private vehicle        Equipment Recommendations None recommended by PT  Recommendations for Other Services       Functional Status Assessment Patient has had a recent decline in their functional status and demonstrates the ability to make significant improvements in function in a reasonable and predictable amount of time.     Precautions / Restrictions Precautions Precautions: Fall Recall of Precautions/Restrictions: Intact Precaution/Restrictions  Comments: MI restrictions Restrictions Weight Bearing Restrictions Per Provider Order: No      Mobility  Bed Mobility Overal bed mobility: Needs Assistance Bed Mobility: Rolling Rolling: Total assist, +2 for physical assistance         General bed mobility comments: Pt participated in rolling L and R for bed pad changing and to position pillows under R ischial tube and R scapula. Pt is total A for rolling with bilateral knees bent and requires max A for maintaining rolled position.    Transfers                        Ambulation/Gait                  Stairs            Wheelchair Mobility     Tilt Bed    Modified Rankin (Stroke Patients Only)       Balance Overall balance assessment: Mild deficits observed, not formally tested                                           Pertinent Vitals/Pain Pain Assessment Pain Assessment: Faces Faces Pain Scale: Hurts little more Pain Location: bilateral hands Pain Descriptors / Indicators: Grimacing Pain Intervention(s): Monitored during session, Limited activity within patient's tolerance    Home Living Family/patient expects to be discharged to:: Other (Comment) [facility] (facility for individuals with intellectual facilities))  Prior Function Prior Level of Function : Needs assist             Mobility Comments: Per pt's sister, pt was needing physical assistance for all mobility tasks. Pt's sister is unsure if pt was needing a hoyer lift for OOB mobility, but reports pt is a WC mobilizer. ADLs Comments: Per pt's sister, pt needing assist with all ADLs.     Extremity/Trunk Assessment   Upper Extremity Assessment Upper Extremity Assessment: RUE deficits/detail;LUE deficits/detail RUE Deficits / Details: elbow, wrist and finger flexion contractures. leadpipe rigidity. pt rests in scapular protraction, elbow flexion, forearm slightly supinated  with wrist and fingers flexed. pt has slightly increased range of motion on the R than L. LUE Deficits / Details: elbow, wrist and finger flexion contractures. leadpipe rigidity. pt rests in scapular protraction, elbow flexion, forearm slightly supinated with wrist and fingers flexed.    Lower Extremity Assessment Lower Extremity Assessment: RLE deficits/detail;LLE deficits/detail RLE Deficits / Details: knee flexion and ankle DF contracture, but has some available range passively. pt resting in hip, knee and ankle flexion. LLE Deficits / Details: significant knee flexion and ankle DF contracture. pt resting in hip, knee and ankle flexion.    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 Following commands: Impaired Following commands impaired:  (Pt unable to follow commands this session.)     Cueing Cueing Techniques: Verbal cues, Gestural cues     General Comments General comments (skin integrity, edema, etc.): SpO2 96% on 2L; HR 94    Exercises     Assessment/Plan    PT Assessment Patient needs continued PT services  PT Problem List Decreased strength;Decreased range of motion;Decreased activity tolerance;Decreased balance;Decreased mobility;Decreased cognition       PT Treatment Interventions Functional mobility training;Therapeutic activities;Therapeutic exercise;Balance training;Wheelchair mobility training    PT Goals (Current goals can be found in the Care Plan  section)  Acute Rehab PT Goals Patient Stated Goal: unable to state at this time PT Goal Formulation: With patient/family Time For Goal Achievement: 08/14/24 Potential to Achieve Goals: Fair    Frequency Min 1X/week     Co-evaluation               AM-PAC PT 6 Clicks Mobility  Outcome Measure Help needed turning from your back to your side while in a flat bed without using bedrails?: Total Help needed moving from lying on your back to sitting on the side of a flat bed without using bedrails?: Total Help needed moving to and from a bed to a chair (including a wheelchair)?: Total Help needed standing up from a chair using your arms (e.g., wheelchair or bedside chair)?: Total Help needed to walk in hospital room?: Total Help needed climbing 3-5 steps with a railing? : Total 6 Click Score: 6    End of Session Equipment Utilized During Treatment: Oxygen (2L) Activity Tolerance: Other (comment);Patient limited by lethargy (impaired cognition) Patient left: in bed;with call bell/phone within reach;with bed alarm set Nurse  Communication: Mobility status;Other (comment) (rotate for pressure relief every 2 hours) PT Visit Diagnosis: Muscle weakness (generalized) (M62.81);Other symptoms and signs involving the nervous system (R29.898)    Time: 8453-8388 PT Time Calculation (min) (ACUTE ONLY): 25 min   Charges:   PT Evaluation $PT Eval Moderate Complexity: 1 Mod   PT General Charges $$ ACUTE PT VISIT: 1 Visit         Leontine Hilt DPT Acute Rehab Services [phone] Prefer contact via chat   Leontine NOVAK Shiro Ellerman 07/31/2024, 4:56 PM

## 2024-07-31 NOTE — Progress Notes (Signed)
" °  Echocardiogram 2D Echocardiogram has been performed. Tinnie FORBES Gosling RDCS 07/31/2024, 2:31 PM "

## 2024-07-31 NOTE — Progress Notes (Signed)
"  °  Brief Progress Note  Patient Name: Sherri Gross Date of Encounter: 07/31/2024 [facility] Cardiologist: None   [name] is a 79 yo female with a pmhx of dementia, intellectual disability, diabetes left spastic hemiparesis, and hyperlipidemia who presented to the ED after being found unconscious in bed. On EMS arrival patient's BP: 104/80 and BG 312. Was initially placed on non-breather though removed by arrival to ED. ECG showed sinus rhythm with TWI in III. Pro BNP 225. Troponin 365 -> 578. Lactic acid 2.7 -> 2.1. WBC 15.2 with left shift. +Parainfluenza. Patient was seen by overnight fellow on 1/6 for NSTEMI. Given co-morbidities recommended to medically treat NSTEMI.   Interval: Patient was placed on IV antibiotics, leukocytosis improved though febrile today.    NSTEMI Echocardiogram ordered, follow up on results.  Repeated troponin for trend.   Continue IV heparin  Start ASA 81 mg  Hypertension BP: 155/74 Continue amlodipine  5 mg, most likely increase tomorrow   Hyperlipidemia Lipid panel ordered for the am  Lactic acidosis Most likely 2/2 CAP, repeat pending.   Hypokalemia Ordered mag  Sinus Tachycardia  Will hold on AV nodal blocking agent as most likely reactive to CAP and do not want to blunt compensatory tachycardia.     For questions or updates, please contact [facility] Please consult [email or web address] for contact info under       Signed, Leontine LOISE Salen, PA-C   "

## 2024-07-31 NOTE — Progress Notes (Signed)
 " PROGRESS NOTE  Sherri Gross  FMW:[id number] DOB: Sep 21, 1945 DOA: 07/29/2024 PCP: Hilma Philis HERO   Brief Narrative: Patient is a 79 year old female with history of dementia, hearing loss, hyperlipidemia, hypertension, diabetes type 2 who presented with shortness of breath, loss of consciousness.  She was brought from nursing facility.  Found to have altered mentation and found to have new oxygen requirement.  On presentation, she was hypertensive, tachypneic, hypoxic requiring nonrebreather.  Labs with WBC count 15.2, elevated troponin, elevated lactate level.  Respiratory viral panel showed parainfluenza virus 1.  CT chest showed cardiomegaly with central venous congestion, mild interstitial edema bilaterally, patchy ground glass upper lobe opacities consistent with edema/pneumonitis.  Patient started on antibiotics for community-acquired pneumonia.  Cardiology consulted.  Started on heparin  drip for NSTEMI  Assessment & Plan:  Principal Problem:   NSTEMI (non-ST elevated myocardial infarction) (HCC)   NSTEMI: Presented with elevated troponin.  Cardiology following.  On heparin  drip.  Patient is nonverbal.  Does not seem that she has active chest pain at present.  Looks comfortable this morning  Acute hypoxic respiratory failure: Likely from pneumonia/parainfluenza virus infection.  Continue to wean the oxygen.  Not on oxygen at baseline  Community-acquired pneumonia/elevated lactate/leukocytosis: Chest x-ray consistent with right upper lobe pneumonitis/aspiration.  Speech therapy consulted and following.  On dysphagia diet.  Continue current antibiotics.  Follow-up cultures.  Continue bronchodilators as needed.  Has mild grade fever.  Hypertension: Takes amlodipine  2.5 mg daily.  Hypotensive on arrival.  Now hypertensive.  Amlodipine  restarted at 5 mg daily.  Continue.  Medication for severe hypertension  Hypokalemia: Supplemented with potassium  Debility/deconditioning/dementia/hearing  loss: Patient lives at nursing facility.  Will consult PT/OT.  Continue memantine .  She is nonambulatory.  She has contractures of bilateral upper extremities        DVT prophylaxis:SCDs Start: 07/30/24 0758     Code Status: Full Code  Family Communication: Called and discussed with sister [name] on phone on 1/7  Patient status:Inpatient  Patient is from :SNF  Anticipated discharge to:SNF  Estimated DC date:2-3 days   Consultants: Cardiology  Procedures:None  Antimicrobials:  Anti-infectives (From admission, onward)    Start     Dose/Rate Route Frequency Ordered Stop   07/30/24 1300  cefTRIAXone  (ROCEPHIN ) 1 g in sodium chloride  0.9 % 100 mL IVPB        1 g 200 mL/hr over 30 Minutes Intravenous Every 24 hours 07/30/24 1247 08/04/24 1259   07/30/24 1300  azithromycin  (ZITHROMAX ) 500 mg in sodium chloride  0.9 % 250 mL IVPB        500 mg 250 mL/hr over 60 Minutes Intravenous Every 24 hours 07/30/24 1247 08/02/24 1259   07/30/24 0015  ceFEPIme  (MAXIPIME ) 2 g in sodium chloride  0.9 % 100 mL IVPB        2 g 200 mL/hr over 30 Minutes Intravenous  Once 07/30/24 0011 07/30/24 0215   07/30/24 0015  vancomycin  (VANCOREADY) IVPB 1500 mg/300 mL        1,500 mg 150 mL/hr over 120 Minutes Intravenous  Once 07/30/24 0011 07/30/24 0437       Subjective: Patient seen and examined at bedside today.  She was lying on  bed.  Does not appear to be in respiratory  distress.  On 2 L of oxygen per minute.  Being fed at bedside.  Does  appear comfortable, most likely she does not have any chest pain.  She is alert and awake.  Objective: Vitals:  07/30/24 2332 07/31/24 0411 07/31/24 0606 07/31/24 0651  BP: (!) 156/80 (!) 190/105 (!) 190/105 (!) 166/90  Pulse: 79 92    Resp: (!) 22 20  20   Temp: 100 F (37.8 C) 98.1 F (36.7 C)    TempSrc: Oral Oral    SpO2: 100% 100%    Weight:      Height:        Intake/Output Summary (Last 24 hours) at 07/31/2024 0823 Last data filed at  07/31/2024 0414 Gross per 24 hour  Intake --  Output 500 ml  Net -500 ml   Filed Weights   07/29/24 2257  Weight: 65.8 kg    Examination:  General exam: Overall comfortable, not in distress, lying on bed HEENT: PERRL Respiratory system: Mild bilateral expiratory wheezing Cardiovascular system: S1 & S2 heard, RRR.  Gastrointestinal system: Abdomen is nondistended, soft and nontender. Central nervous system: Alert and awake, nonverbal Extremities: No edema, no clubbing ,no cyanosis, contractures of upper extremities Skin: No rashes, no ulcers,no icterus     Data Reviewed: I have personally reviewed following labs and imaging studies  CBC: Recent Labs  Lab 07/29/24 2259 07/29/24 2323 07/30/24 0408 07/31/24 0443  WBC 15.2*  --   --  6.2  NEUTROABS 13.0*  --   --   --   HGB 13.3 13.6 13.3 13.1  HCT 42.6 40.0 39.0 39.0  MCV 95.1  --   --  89.0  PLT 224  --   --  211   Basic Metabolic Panel: Recent Labs  Lab 07/29/24 2259 07/29/24 2323 07/30/24 0408 07/31/24 0443  NA 142 145 143 139  K 3.7 3.4* 3.6 3.0*  CL 107  --   --  103  CO2 20*  --   --  21*  GLUCOSE 208*  --   --  81  BUN 31*  --   --  13  CREATININE 0.76  --   --  0.52  CALCIUM 8.8*  --   --  8.9     Recent Results (from the past 240 hours)  Blood culture (routine x 2)     Status: None (Preliminary result)   Collection Time: 07/30/24 12:38 AM   Specimen: BLOOD RIGHT ARM  Result Value Ref Range Status   Specimen Description BLOOD RIGHT ARM  Final   Special Requests   Final    BOTTLES DRAWN AEROBIC AND ANAEROBIC Blood Culture results may not be optimal due to an inadequate volume of blood received in culture bottles   Culture   Final    NO GROWTH 1 DAY Performed at [facility], [address], [place], [place] [address]    Report Status PENDING  Incomplete  Blood culture (routine x 2)     Status: None (Preliminary result)   Collection Time: 07/30/24 12:42 AM   Specimen: BLOOD LEFT ARM   Result Value Ref Range Status   Specimen Description BLOOD LEFT ARM  Final   Special Requests   Final    BOTTLES DRAWN AEROBIC AND ANAEROBIC Blood Culture results may not be optimal due to an inadequate volume of blood received in culture bottles   Culture   Final    NO GROWTH 1 DAY Performed at [facility], [address] [place], [place] [address]    Report Status PENDING  Incomplete  Resp panel by RT-PCR (RSV, Flu A&B, Covid) Anterior Nasal Swab     Status: None   Collection Time: 07/30/24  1:19 AM   Specimen: Anterior Nasal Swab  Result Value Ref Range Status   SARS Coronavirus 2 by RT PCR NEGATIVE NEGATIVE Final   Influenza A by PCR NEGATIVE NEGATIVE Final   Influenza B by PCR NEGATIVE NEGATIVE Final    Comment: (NOTE) The Xpert Xpress SARS-CoV-2/FLU/RSV plus assay is intended as an aid in the diagnosis of influenza from Nasopharyngeal swab specimens and should not be used as a sole basis for treatment. Nasal washings and aspirates are unacceptable for Xpert Xpress SARS-CoV-2/FLU/RSV testing.  Fact Sheet for Patients: bloggercourse.com  Fact Sheet for Healthcare Providers: seriousbroker.it  This test is not yet approved or cleared by the [place]  FDA and has been authorized for detection and/or diagnosis of SARS-CoV-2 by FDA under an Emergency Use Authorization (EUA). This EUA will remain in effect (meaning this test can be used) for the duration of the COVID-19 declaration under Section 564(b)(1) of the Act, 21 U.S.C. section 360bbb-3(b)(1), unless the authorization is terminated or revoked.     Resp Syncytial Virus by PCR NEGATIVE NEGATIVE Final    Comment: (NOTE) Fact Sheet for Patients: bloggercourse.com  Fact Sheet for Healthcare Providers: seriousbroker.it  This test is not yet approved or cleared by the [place]  FDA and has been authorized  for detection and/or diagnosis of SARS-CoV-2 by FDA under an Emergency Use Authorization (EUA). This EUA will remain in effect (meaning this test can be used) for the duration of the COVID-19 declaration under Section 564(b)(1) of the Act, 21 U.S.C. section 360bbb-3(b)(1), unless the authorization is terminated or revoked.  Performed at [facility], [address], [place], [place] [address]   Respiratory (~20 pathogens) panel by PCR     Status: Abnormal   Collection Time: 07/30/24 12:43 PM   Specimen: Nasopharyngeal Swab; Respiratory  Result Value Ref Range Status   Adenovirus NOT DETECTED NOT DETECTED Final   Coronavirus 229E NOT DETECTED NOT DETECTED Final    Comment: (NOTE) The Coronavirus on the Respiratory Panel, DOES NOT test for the novel  Coronavirus (2019 nCoV)    Coronavirus HKU1 NOT DETECTED NOT DETECTED Final   Coronavirus NL63 NOT DETECTED NOT DETECTED Final   Coronavirus OC43 NOT DETECTED NOT DETECTED Final   Metapneumovirus NOT DETECTED NOT DETECTED Final   Rhinovirus / Enterovirus NOT DETECTED NOT DETECTED Final   Influenza A NOT DETECTED NOT DETECTED Final   Influenza B NOT DETECTED NOT DETECTED Final   Parainfluenza Virus 1 DETECTED (A) NOT DETECTED Final   Parainfluenza Virus 2 NOT DETECTED NOT DETECTED Final   Parainfluenza Virus 3 NOT DETECTED NOT DETECTED Final   Parainfluenza Virus 4 NOT DETECTED NOT DETECTED Final   Respiratory Syncytial Virus NOT DETECTED NOT DETECTED Final   Bordetella pertussis NOT DETECTED NOT DETECTED Final   Bordetella Parapertussis NOT DETECTED NOT DETECTED Final   Chlamydophila pneumoniae NOT DETECTED NOT DETECTED Final   Mycoplasma pneumoniae NOT DETECTED NOT DETECTED Final    Comment: Performed at [facility], [address], [place], [place] [address]     Radiology Studies: CT CHEST WO CONTRAST Result Date: 07/30/2024 CLINICAL DATA:  Respiratory distress. EXAM: CT CHEST WITHOUT CONTRAST TECHNIQUE: Multidetector  CT imaging of the chest was performed following the standard protocol without IV contrast. RADIATION DOSE REDUCTION: This exam was performed according to the departmental dose-optimization program which includes automated exposure control, adjustment of the mA and/or kV according to patient size and/or use of iterative reconstruction technique. COMPARISON:  07/30/2024 at 2:08  a.m. FINDINGS: Positioning is somewhat suboptimal due to patient's kyphosis and difficulty positioning prior to scanning. Cardiovascular: Mild stable cardiomegaly. Calcified plaque over the left main and 3 vessel coronary arteries. Thoracic aorta is normal caliber. There is calcified plaque throughout the thoracic aorta. Pulmonary arteries are unremarkable on this noncontrast exam. Mediastinum/Nodes: No mediastinal or hilar adenopathy. Remaining mediastinal structures are unremarkable. Lungs/Pleura: Moderate stable elevation of the right hemidiaphragm. Right apical pleural thickening unchanged. Atelectasis over the right lung base unchanged. No effusion. Near resolution of previously seen patchy hazy central lung attenuation suggesting resolved interstitial edema. Airways are unremarkable. Upper Abdomen: No acute findings.  Unchanged from recent prior exam. Musculoskeletal: Unchanged. IMPRESSION: 1. Interval resolution of hazy patchy bilateral ground-glass opacification suggesting resolved interstitial edema. 2. Stable mild cardiomegaly. Atherosclerotic coronary artery disease. 3. Aortic atherosclerosis. Aortic Atherosclerosis (ICD10-I70.0). Electronically Signed   By: Toribio Agreste M.D.   On: 07/30/2024 14:13   CT Angio Chest PE W and/or Wo Contrast Result Date: 07/30/2024 EXAM: CTA CHEST 07/30/2024 02:18:16 AM TECHNIQUE: CTA of the chest was performed with the administration of 75 mL of intravenous contrast (iohexol  (OMNIPAQUE ) 350 MG/ML injection). Multiplanar reformatted images are provided for review. MIP images are provided for review.  Automated exposure control, iterative reconstruction, and/or weight based adjustment of the mA/kV was utilized to reduce the radiation dose to as low as reasonably achievable. COMPARISON: Comparison is made with portable chest 07/29/2024, 06/21/2024, and 10/02/2023. No prior cross-sectional imaging for comparison. CLINICAL HISTORY: Pulmonary embolism (PE) suspected, high prob. Patient presents with hypoxia. FINDINGS: PULMONARY ARTERIES: The pulmonary arteries are normal caliber without evidence of thromboemboli. Main pulmonary artery is normal in caliber. MEDIASTINUM: There is mild panchamber cardiomegaly with a slight left chamber predominance with prominent central pulmonary veins. There is a tortuous thoracic aorta with mild patchy calcific plaques, scattered calcification in the great vessels without aneurysm, stenosis, or dissection. No pericardial effusion. LYMPH NODES: No mediastinal, hilar or axillary lymphadenopathy. LUNGS AND PLEURA: There are trace pleural effusions. No pneumothorax. There is mild interstitial edema centrally and in the upper lobes. The lungs are expiratory with a chronically elevated right hemidiaphragm. There are patchy ground-glass opacities in the upper lobes consistent with ground glass edema and/or pneumonitis. There is posterior atelectasis in the lower lobes, additional subsegmental atelectasis along the domes of the diaphragm. UPPER ABDOMEN: No acute abnormality is seen in the upper abdomen. The gallbladder is absent. SOFT TISSUES AND BONES: There is kyphosis and degenerative change of the thoracic spine, osteopenia. 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 No acute bone or soft tissue abnormality. IMPRESSION: 1. No evidence of pulmonary embolism. 2. Cardiomegaly with central venous distention, Mild interstitial edema centrally and in the upper lobes, with  patchy ground-glass upper lobe opacities consistent with edema and/or pneumonitis. Findings compatible with chf or fluid overload, underlying pneumonitis not excluded. . . 3. Trace pleural effusions. Electronically signed by: Francis Quam MD 07/30/2024 02:41 AM EST RP Workstation: [id number]   CT HEAD WO CONTRAST ( ) Result Date: 07/30/2024 EXAM: CT HEAD WITHOUT 07/30/2024 12:34:10 AM TECHNIQUE: CT of the head was performed without the administration of intravenous contrast. Automated exposure control, iterative reconstruction, and/or weight based adjustment of the mA/kV was utilized to reduce the radiation dose to as low as reasonably achievable. COMPARISON: 12/08/2020 CLINICAL HISTORY: Mental status change, unknown cause. FINDINGS: BRAIN AND VENTRICLES: No acute intracranial hemorrhage. No mass effect or midline shift. No  extra-axial fluid collection. Old right thalamic lacunar infarct. No hydrocephalus. ORBITS: There is bulging and thinning of the lateral wall of the right ethmoid air cells into the medial right orbit with mass effect on the medial rectus muscle. SINUSES AND MASTOIDS: Complete opacification of the right frontal sinus and ethmoid air cells. Mucosal thickening in the right maxillary sinus. There is bulging and thinning of the lateral wall of the right ethmoid air cells into the medial right orbit with mass effect on the medial rectus muscle. SOFT TISSUES AND SKULL: No acute skull fracture. No acute soft tissue abnormality. IMPRESSION: 1. No acute intracranial abnormality. 2. Complete opacification of the right frontal sinus and ethmoid air cells with mucosal thickening in the right maxillary sinus .findings compatible with chronic sinusitis . Bulging and thinning of the lateral wall of the right ethmoid air cells into the medial right orbit and mass effect on the medial rectus muscle. Electronically signed by: Franky Crease MD 07/30/2024 12:49 AM EST RP Workstation: [id number]   DG Chest Port  1 View Result Date: 07/29/2024 EXAM: 1 VIEW(S) XRAY OF THE CHEST 07/29/2024 11:48:00 PM COMPARISON: 06/21/2024 CLINICAL HISTORY: hypoxia FINDINGS: LUNGS AND PLEURA: Left basilar opacity with silhouetting of the left hemidiaphragm. Elevated right hemidiaphragm with bowel gaseous distention under the right hemidiaphragm. No pneumothorax. HEART AND MEDIASTINUM: Atherosclerotic calcifications of the aortic arch. BONES AND SOFT TISSUES: Degenerative changes of the left shoulder. No acute osseous abnormality. IMPRESSION: 1. Left basilar atelectasis or infiltrate. 2. Elevated right hemidiaphragm with subjacent bowel gaseous distention. Electronically signed by: Greig Pique MD 07/29/2024 11:52 PM EST RP Workstation: [id number]    Scheduled Meds:  potassium chloride   60 mEq Oral Once   Continuous Infusions:  azithromycin  500 mg (07/30/24 1654)   cefTRIAXone  (ROCEPHIN )  IV 1 g (07/30/24 1510)   heparin  600 Units/hr (07/31/24 0137)     LOS: 1 day   Sherri Mustache, MD [facility] P1/01/2025, 8:23 AM  "

## 2024-07-31 NOTE — Progress Notes (Signed)
 PHARMACY - ANTICOAGULATION CONSULT NOTE  Pharmacy Consult for heparin  Indication: chest pain/ACS  Allergies[1]  Patient Measurements: Height: 5' 5 (165.1 cm) Weight: 65.8 kg (145 lb) IBW/kg (Calculated) : 57 HEPARIN  DW (KG): 65.8  Vital Signs: Temp: 100 F (37.8 C) (01/06 2332) Temp Source: Oral (01/06 2332) BP: 156/80 (01/06 2332) Pulse Rate: 79 (01/06 2332)  Labs: Recent Labs    07/29/24 2259 07/29/24 2323 07/30/24 0408 07/30/24 1508 07/31/24 0005  HGB 13.3 13.6 13.3  --   --   HCT 42.6 40.0 39.0  --   --   PLT 224  --   --   --   --   HEPARINUNFRC  --   --   --  >1.10* 0.98*  CREATININE 0.76  --   --   --   --     Estimated Creatinine Clearance: 51.3 mL/min (by C-G formula based on SCr of 0.76 mg/dL).  Assessment: 11 yoF presented after being found unconscious. Pharmacy consulted to dose heparin  for ACS.  -CBC WNL -No PTA anticoagulation  -Trop 578  AM: Heparin  level supra-therapeutic x2 after rate decrease to 800 units/hr. Per RN, collected appropriately w/ no bleeding at this time.  Goal of Therapy:  Heparin  level 0.3-0.7 units/ml Monitor platelets by anticoagulation protocol: Yes   Plan:  Hold heparin  x1 hr Decrease rate to 600 units/hr Check 8 hr heparin  level  Lynwood Poplar, PharmD, BCPS Clinical Pharmacist 07/31/2024 12:34 AM          [1]  Allergies Allergen Reactions   Galantamine     Unknown per mar   Tuberculin Ppd     unknown

## 2024-07-31 NOTE — Progress Notes (Signed)
 PHARMACY - ANTICOAGULATION CONSULT NOTE  Pharmacy Consult for heparin  Indication: chest pain/ACS  Allergies[1]  Patient Measurements: Height: 5' 5 (165.1 cm) Weight: 65.8 kg (145 lb) IBW/kg (Calculated) : 57 HEPARIN  DW (KG): 65.8  Vital Signs: Temp: 99.9 F (37.7 C) (01/07 0836) Temp Source: Oral (01/07 0836) BP: 140/71 (01/07 0824) Pulse Rate: 102 (01/07 0824)  Labs: Recent Labs    07/29/24 2259 07/29/24 2323 07/30/24 0408 07/30/24 1508 07/31/24 0005 07/31/24 0443 07/31/24 0922  HGB 13.3 13.6 13.3  --   --  13.1  --   HCT 42.6 40.0 39.0  --   --  39.0  --   PLT 224  --   --   --   --  211  --   HEPARINUNFRC  --   --   --  >1.10* 0.98*  --  0.45  CREATININE 0.76  --   --   --   --  0.52  --     Estimated Creatinine Clearance: 51.3 mL/min (by C-G formula based on SCr of 0.52 mg/dL).  Assessment: 67 yoF presented after being found unconscious w/ elevated troponin. Pharmacy consulted to dose heparin  for ACS. No AC PTA.  Heparin  level therapeutic, CBC ok.  Goal of Therapy:  Heparin  level 0.3-0.7 units/ml Monitor platelets by anticoagulation protocol: Yes   Plan:  Heparin  600 units/h Daily heparin  level and CBC  Ozell Jamaica, PharmD, BCPS, [facility] Clinical Pharmacist [phone] Please check AMION for all [facility] Pharmacy numbers 07/31/2024           [1]  Allergies Allergen Reactions   Galantamine     Unknown per mar   Tuberculin Ppd     unknown

## 2024-07-31 NOTE — Progress Notes (Addendum)
 Initial Nutrition Assessment  DOCUMENTATION CODES:  Non-severe (moderate) malnutrition in context of social or environmental circumstances  INTERVENTION:  Multivitamin PO once daily. 100 mg thiamine  PO once daily for 7 days. Glucerna Shake PO TID. Each supplement provides 220 Kcals and 10 grams of protein. Magic Cup TID. Each supplement provides 290 Kcals and 9 grams of protein. Provide 1:1 feeding assistance. Continue dysphagia 2 diet with thin liquids.   NUTRITION DIAGNOSIS:  Moderate Malnutrition related to social / environmental circumstances as evidenced by moderate fat depletion, severe muscle depletion   GOAL:  Patient will meet greater than or equal to 90% of their needs   MONITOR:  PO intake, Supplement acceptance, Labs, Weight trends  REASON FOR ASSESSMENT:  Consult Assessment of nutrition requirement/status  ASSESSMENT:  Patient presented with loss of consciousness from nursing home and was admitted for NSTEMI and PNA. PMH significant for dementia/nonverbal at baseline, intellectual disability, spastic hemiparesis, hearing loss, DM2, HTN, and dyslipidemia.  01/05 found unconscious in nursing home bed, arrived in ED. 01/06 SLP BSE = Dysphagia 2, thin liquids (diet at nursing home); edentulous.  Visited the patient with RN at bedside. The patient is nonverbal. RN reports the patient has mostly been eating apple sauce. Recommend RN encourage ONS.  Scheduled Meds:  amLODipine   5 mg Oral QHS   aspirin   81 mg Oral Daily   azithromycin   500 mg Oral Daily   memantine   10 mg Oral BID   Continuous Infusions:  cefTRIAXone  (ROCEPHIN )  IV 2 g (07/31/24 1327)   heparin  600 Units/hr (07/31/24 0137)   magnesium  sulfate bolus IVPB 2 g (07/31/24 1654)   PRN Meds:.acetaminophen , hydrALAZINE , ondansetron  (ZOFRAN ) IV  Diet Order             DIET DYS 2 Room service appropriate? No; Fluid consistency: Thin  Diet effective now                  Meal Intake: 25%  Labs:      Latest Ref Rng & Units 07/31/2024    4:43 AM 07/30/2024    4:08 AM 07/29/2024   11:23 PM  CMP  Glucose 70 - 99 mg/dL 81     BUN 8 - 23 mg/dL 13     Creatinine 9.55 - 1.00 mg/dL 9.47     Sodium 864 - 854 mmol/L 139  143  145   Potassium 3.5 - 5.1 mmol/L 3.0  3.6  3.4   Chloride 98 - 111 mmol/L 103     CO2 22 - 32 mmol/L 21     Calcium 8.9 - 10.3 mg/dL 8.9        I/O: + 1 L since admit  NUTRITION - FOCUSED PHYSICAL EXAM: Flowsheet Row Most Recent Value  Orbital Region Moderate depletion  Upper Arm Region Moderate depletion  Thoracic and Lumbar Region Mild depletion  Buccal Region No depletion  Temple Region Moderate depletion  Clavicle Bone Region Mild depletion  Clavicle and Acromion Bone Region Mild depletion  Scapular Bone Region Mild depletion  Dorsal Hand Mild depletion  Patellar Region Moderate depletion  Anterior Thigh Region Severe depletion  Posterior Calf Region Severe depletion  Edema (RD Assessment) Mild  [generalized non-pitting]  Hair Reviewed  Eyes Reviewed  Mouth --  [edentulous]  Skin Reviewed  Nails Reviewed    EDUCATION NEEDS:  Not appropriate for education at this time  Skin:  Skin Assessment: Reviewed RN Assessment  Last BM:  none charted  Height:  Ht Readings from  Last 1 Encounters:  07/29/24 5' 5 (1.651 m)   Weight:  Wt Readings from Last 10 Encounters:  07/29/24 65.8 kg  10/02/23 55 kg  03/09/20 55 kg  01/31/17 69.7 kg   Weight Change: unable to determine due to lack of recent weight history  Usual Body Weight: unable to determine  Edema: generalized non-pitting  Ideal Body Weight:  57 kg   BMI:  Body mass index is 24.13 kg/m.  Estimated Daily Nutritional Needs:  Kcal:  1500-1700 Protein:  70-85 g Fluid:  >/=1500 mL    Leverne Ruth, MS, RDN, LDN [place]. [facility] See AMION for contact information Secure chat preferred

## 2024-08-01 ENCOUNTER — Other Ambulatory Visit (HOSPITAL_COMMUNITY): Payer: Self-pay

## 2024-08-01 DIAGNOSIS — E440 Moderate protein-calorie malnutrition: Secondary | ICD-10-CM | POA: Insufficient documentation

## 2024-08-01 DIAGNOSIS — I214 Non-ST elevation (NSTEMI) myocardial infarction: Secondary | ICD-10-CM | POA: Diagnosis not present

## 2024-08-01 LAB — CBC
HCT: 36.8 % (ref 36.0–46.0)
Hemoglobin: 12.3 g/dL (ref 12.0–15.0)
MCH: 29.7 pg (ref 26.0–34.0)
MCHC: 33.4 g/dL (ref 30.0–36.0)
MCV: 88.9 fL (ref 80.0–100.0)
Platelets: 197 K/uL (ref 150–400)
RBC: 4.14 MIL/uL (ref 3.87–5.11)
RDW: 14.3 % (ref 11.5–15.5)
WBC: 5.6 K/uL (ref 4.0–10.5)
nRBC: 0 % (ref 0.0–0.2)

## 2024-08-01 LAB — BASIC METABOLIC PANEL WITH GFR
Anion gap: 11 (ref 5–15)
BUN: 18 mg/dL (ref 8–23)
CO2: 24 mmol/L (ref 22–32)
Calcium: 9 mg/dL (ref 8.9–10.3)
Chloride: 106 mmol/L (ref 98–111)
Creatinine, Ser: 0.61 mg/dL (ref 0.44–1.00)
GFR, Estimated: >60 mL/min
Glucose, Bld: 116 mg/dL — ABNORMAL HIGH (ref 70–99)
Potassium: 3.4 mmol/L — ABNORMAL LOW (ref 3.5–5.1)
Sodium: 141 mmol/L (ref 135–145)

## 2024-08-01 LAB — HEPARIN LEVEL (UNFRACTIONATED): Heparin Unfractionated: 0.34 [IU]/mL (ref 0.30–0.70)

## 2024-08-01 MED ORDER — AMLODIPINE BESYLATE 10 MG PO TABS
10.0000 mg | ORAL_TABLET | Freq: Every day | ORAL | Status: DC
  Administered 2024-08-01 – 2024-08-04 (×4): 10 mg via ORAL
  Filled 2024-08-01 (×4): qty 1

## 2024-08-01 MED ORDER — POTASSIUM CHLORIDE CRYS ER 20 MEQ PO TBCR
60.0000 meq | EXTENDED_RELEASE_TABLET | Freq: Once | ORAL | Status: AC
  Administered 2024-08-01: 60 meq via ORAL
  Filled 2024-08-01: qty 3

## 2024-08-01 MED ORDER — AZITHROMYCIN 250 MG PO TABS
500.0000 mg | ORAL_TABLET | Freq: Every day | ORAL | Status: AC
  Administered 2024-08-02: 500 mg via ORAL
  Filled 2024-08-01: qty 2

## 2024-08-01 MED ORDER — SENNOSIDES-DOCUSATE SODIUM 8.6-50 MG PO TABS
1.0000 | ORAL_TABLET | Freq: Two times a day (BID) | ORAL | Status: DC
  Administered 2024-08-01 – 2024-08-04 (×7): 1 via ORAL
  Filled 2024-08-01 (×8): qty 1

## 2024-08-01 MED ORDER — IPRATROPIUM-ALBUTEROL 0.5-2.5 (3) MG/3ML IN SOLN: 3.0000 mL | Freq: Four times a day (QID) | RESPIRATORY_TRACT | Status: DC | PRN

## 2024-08-01 MED ORDER — POLYETHYLENE GLYCOL 3350 17 G PO PACK
17.0000 g | PACK | Freq: Every day | ORAL | Status: DC
  Administered 2024-08-01 – 2024-08-04 (×2): 17 g via ORAL
  Filled 2024-08-01 (×5): qty 1

## 2024-08-01 NOTE — Progress Notes (Signed)
 " PROGRESS NOTE  Sherri Gross  FMW:[id number] DOB: February 15, 1946 DOA: 07/29/2024 PCP: Hilma Philis HERO   Brief Narrative: Patient is a 79 year old female with history of dementia, hearing loss, hyperlipidemia, hypertension, diabetes type 2 who presented with shortness of breath, loss of consciousness.  She was brought from nursing facility.  Found to have altered mentation and found to have new oxygen requirement.  On presentation, she was hypertensive, tachypneic, hypoxic requiring nonrebreather.  Labs with WBC count 15.2, elevated troponin, elevated lactate level.  Respiratory viral panel showed parainfluenza virus 1.  CT chest showed cardiomegaly with central venous congestion, mild interstitial edema bilaterally, patchy ground glass upper lobe opacities consistent with edema/pneumonitis.  Patient started on antibiotics for community-acquired pneumonia.  Cardiology consulted.  Started on heparin  drip for NSTEMI. NSTEMI is less likely, echo showed normal EF.  Heparin  drip discontinued.  Patient has improved clinically.  Possible plan for discharge tomorrow to SNF  Assessment & Plan:  Principal Problem:   NSTEMI (non-ST elevated myocardial infarction) [facility]) Active Problems:   Malnutrition of moderate degree   NSTEMI: Presented with elevated troponin.  Cardiology were following.  Was on heparin  drip.  Patient is nonverbal.  Does not seem that she has active chest pain at present.  Looks comfortable this morning.  Echo showed EF of 70-75%, no wall motion abnormality, grade 1 diastolic function.  Heparin  drip discontinued.  Cardiology signed off  Acute hypoxic respiratory failure: Likely from pneumonia/parainfluenza virus infection.  Now weaned to room air. not on oxygen at baseline  Community-acquired pneumonia/elevated lactate/leukocytosis: Chest x-ray consistent with right upper lobe pneumonitis/aspiration.  Speech therapy consulted and following.  On dysphagia diet.  Continue current antibiotics.   Follow-up cultures: NGTD.  Continue bronchodilators as needed.  Afebrile now  Hypertension: Takes amlodipine  2.5 mg daily.  Hypotensive on arrival.  Now hypertensive.  Amlodipine  restarted at 10 mg daily.   Hypokalemia: Supplemented with potassium  Debility/deconditioning/dementia/hearing loss: Patient lives at nursing facility.  Will consult PT/OT.  Continue memantine .  She is nonambulatory.  She has contractures of bilateral upper extremities   Nutrition Problem: Moderate Malnutrition Etiology: social / environmental circumstances    DVT prophylaxis:SCDs Start: 07/30/24 0758     Code Status: Full Code  Family Communication: Called and discussed with sister [name] on phone on 1/8  Patient status:Inpatient  Patient is from :SNF  Anticipated discharge to:SNF  Estimated DC date:likely tomorrow   Consultants: Cardiology  Procedures:None  Antimicrobials:  Anti-infectives (From admission, onward)    Start     Dose/Rate Route Frequency Ordered Stop   08/02/24 1000  azithromycin  (ZITHROMAX ) tablet 500 mg        500 mg Oral Daily 08/01/24 1308 08/03/24 0959   07/31/24 1345  cefTRIAXone  (ROCEPHIN ) 2 g in sodium chloride  0.9 % 100 mL IVPB        2 g 200 mL/hr over 30 Minutes Intravenous Every 24 hours 07/31/24 1256 08/04/24 1259   07/31/24 1200  azithromycin  (ZITHROMAX ) tablet 500 mg        500 mg Oral Daily 07/31/24 0850 08/01/24 0914   07/30/24 1300  cefTRIAXone  (ROCEPHIN ) 1 g in sodium chloride  0.9 % 100 mL IVPB  Status:  Discontinued        1 g 200 mL/hr over 30 Minutes Intravenous Every 24 hours 07/30/24 1247 07/31/24 1256   07/30/24 1300  azithromycin  (ZITHROMAX ) 500 mg in sodium chloride  0.9 % 250 mL IVPB  Status:  Discontinued        500  mg 250 mL/hr over 60 Minutes Intravenous Every 24 hours 07/30/24 1247 07/31/24 0850   07/30/24 0015  ceFEPIme  (MAXIPIME ) 2 g in sodium chloride  0.9 % 100 mL IVPB        2 g 200 mL/hr over 30 Minutes Intravenous  Once 07/30/24 0011  07/30/24 0215   07/30/24 0015  vancomycin  (VANCOREADY) IVPB 1500 mg/300 mL        1,500 mg 150 mL/hr over 120 Minutes Intravenous  Once 07/30/24 0011 07/30/24 0437       Subjective: Patient seen and examined at the bedside today.  Hemodynamically stable.  Overall comfortable, lying on bed.  Nonverbal.  Does not appear to be in any acute distress.  On room air.  Being fed  Objective: Vitals:   08/01/24 0101 08/01/24 0632 08/01/24 0846 08/01/24 1154  BP: 132/73 (!) 159/78 (!) 160/83 (!) 157/85  Pulse: 75 85 80 92  Resp: 20 (!) 21 (!) 22 (!) 21  Temp: 98.5 F (36.9 C) 99.3 F (37.4 C) 99.9 F (37.7 C) 98 F (36.7 C)  TempSrc: Axillary Axillary Oral Oral  SpO2: 100% 98% 98% 98%  Weight:      Height:        Intake/Output Summary (Last 24 hours) at 08/01/2024 1309 Last data filed at 08/01/2024 1032 Gross per 24 hour  Intake 310 ml  Output 50 ml  Net 260 ml   Filed Weights   07/29/24 2257  Weight: 65.8 kg    Examination:   General exam: Overall comfortable, not in distress,lying on bed HEENT: PERRL Respiratory system:  no wheezes or crackles  Cardiovascular system: S1 & S2 heard, RRR.  Gastrointestinal system: Abdomen is nondistended, soft and nontender. Central nervous system: Alert and awake but not oriented Extremities: No edema, no clubbing ,no cyanosis, contractures of upper extremities Skin: No rashes, no ulcers,no icterus     Data Reviewed: I have personally reviewed following labs and imaging studies  CBC: Recent Labs  Lab 07/29/24 2259 07/29/24 2323 07/30/24 0408 07/31/24 0443 08/01/24 0532  WBC 15.2*  --   --  6.2 5.6  NEUTROABS 13.0*  --   --   --   --   HGB 13.3 13.6 13.3 13.1 12.3  HCT 42.6 40.0 39.0 39.0 36.8  MCV 95.1  --   --  89.0 88.9  PLT 224  --   --  211 197   Basic Metabolic Panel: Recent Labs  Lab 07/29/24 2259 07/29/24 2323 07/30/24 0408 07/31/24 0443 07/31/24 1413 08/01/24 0532  NA 142 145 143 139  --  141  K 3.7 3.4* 3.6  3.0*  --  3.4*  CL 107  --   --  103  --  106  CO2 20*  --   --  21*  --  24  GLUCOSE 208*  --   --  81  --  116*  BUN 31*  --   --  13  --  18  CREATININE 0.76  --   --  0.52  --  0.61  CALCIUM 8.8*  --   --  8.9  --  9.0  MG  --   --   --   --  1.6*  --      Recent Results (from the past 240 hours)  Blood culture (routine x 2)     Status: None (Preliminary result)   Collection Time: 07/30/24 12:38 AM   Specimen: BLOOD RIGHT ARM  Result Value Ref Range Status  Specimen Description BLOOD RIGHT ARM  Final   Special Requests   Final    BOTTLES DRAWN AEROBIC AND ANAEROBIC Blood Culture results may not be optimal due to an inadequate volume of blood received in culture bottles   Culture   Final    NO GROWTH 2 DAYS Performed at [facility], [address], [place], [place] [address]    Report Status PENDING  Incomplete  Blood culture (routine x 2)     Status: None (Preliminary result)   Collection Time: 07/30/24 12:42 AM   Specimen: BLOOD LEFT ARM  Result Value Ref Range Status   Specimen Description BLOOD LEFT ARM  Final   Special Requests   Final    BOTTLES DRAWN AEROBIC AND ANAEROBIC Blood Culture results may not be optimal due to an inadequate volume of blood received in culture bottles   Culture   Final    NO GROWTH 2 DAYS Performed at [facility], [address] [place], [place] [address]    Report Status PENDING  Incomplete  Resp panel by RT-PCR (RSV, Flu A&B, Covid) Anterior Nasal Swab     Status: None   Collection Time: 07/30/24  1:19 AM   Specimen: Anterior Nasal Swab  Result Value Ref Range Status   SARS Coronavirus 2 by RT PCR NEGATIVE NEGATIVE Final   Influenza A by PCR NEGATIVE NEGATIVE Final   Influenza B by PCR NEGATIVE NEGATIVE Final    Comment: (NOTE) The Xpert Xpress SARS-CoV-2/FLU/RSV plus assay is intended as an aid in the diagnosis of influenza from Nasopharyngeal swab specimens and should not be used as a sole basis for treatment. Nasal  washings and aspirates are unacceptable for Xpert Xpress SARS-CoV-2/FLU/RSV testing.  Fact Sheet for Patients: bloggercourse.com  Fact Sheet for Healthcare Providers: seriousbroker.it  This test is not yet approved or cleared by the [place]  FDA and has been authorized for detection and/or diagnosis of SARS-CoV-2 by FDA under an Emergency Use Authorization (EUA). This EUA will remain in effect (meaning this test can be used) for the duration of the COVID-19 declaration under Section 564(b)(1) of the Act, 21 U.S.C. section 360bbb-3(b)(1), unless the authorization is terminated or revoked.     Resp Syncytial Virus by PCR NEGATIVE NEGATIVE Final    Comment: (NOTE) Fact Sheet for Patients: bloggercourse.com  Fact Sheet for Healthcare Providers: seriousbroker.it  This test is not yet approved or cleared by the [place]  FDA and has been authorized for detection and/or diagnosis of SARS-CoV-2 by FDA under an Emergency Use Authorization (EUA). This EUA will remain in effect (meaning this test can be used) for the duration of the COVID-19 declaration under Section 564(b)(1) of the Act, 21 U.S.C. section 360bbb-3(b)(1), unless the authorization is terminated or revoked.  Performed at [facility], [address], [place], [place] [address]   Respiratory (~20 pathogens) panel by PCR     Status: Abnormal   Collection Time: 07/30/24 12:43 PM   Specimen: Nasopharyngeal Swab; Respiratory  Result Value Ref Range Status   Adenovirus NOT DETECTED NOT DETECTED Final   Coronavirus 229E NOT DETECTED NOT DETECTED Final    Comment: (NOTE) The Coronavirus on the Respiratory Panel, DOES NOT test for the novel  Coronavirus (2019 nCoV)    Coronavirus HKU1 NOT DETECTED NOT DETECTED Final   Coronavirus NL63 NOT DETECTED NOT DETECTED Final   Coronavirus OC43 NOT DETECTED NOT DETECTED  Final   Metapneumovirus NOT DETECTED NOT DETECTED Final  Rhinovirus / Enterovirus NOT DETECTED NOT DETECTED Final   Influenza A NOT DETECTED NOT DETECTED Final   Influenza B NOT DETECTED NOT DETECTED Final   Parainfluenza Virus 1 DETECTED (A) NOT DETECTED Final   Parainfluenza Virus 2 NOT DETECTED NOT DETECTED Final   Parainfluenza Virus 3 NOT DETECTED NOT DETECTED Final   Parainfluenza Virus 4 NOT DETECTED NOT DETECTED Final   Respiratory Syncytial Virus NOT DETECTED NOT DETECTED Final   Bordetella pertussis NOT DETECTED NOT DETECTED Final   Bordetella Parapertussis NOT DETECTED NOT DETECTED Final   Chlamydophila pneumoniae NOT DETECTED NOT DETECTED Final   Mycoplasma pneumoniae NOT DETECTED NOT DETECTED Final    Comment: Performed at [facility], [address] [place], [place] [address]     Radiology Studies: ECHOCARDIOGRAM COMPLETE Result Date: 07/31/2024    ECHOCARDIOGRAM REPORT   Patient Name:   RAHI CHANDONNET Date of Exam: 07/31/2024 Medical Rec #:  [id number]       Height:       65.0 in Accession #:    [id number]      Weight:       145.0 lb Date of Birth:  July 19, 1946      BSA:          1.725 m Patient Age:    78 years        BP:           125/67 mmHg Patient Gender: F               HR:           102 bpm. Exam Location:  Inpatient Procedure: 2D Echo, Cardiac Doppler and Color Doppler (Both Spectral and Color            Flow Doppler were utilized during procedure). Indications:    NSTEMI I21.4  History:        Patient has no prior history of Echocardiogram examinations.                 Risk Factors:Hypertension.  Sonographer:    Tinnie Gosling RDCS Referring Phys: [id number] LOGAN N LOCKWOOD IMPRESSIONS  1. Left ventricular ejection fraction, by estimation, is 70 to 75%. The left ventricle has hyperdynamic function. The left ventricle has no regional wall motion abnormalities. There is mild concentric left ventricular hypertrophy. Left ventricular diastolic parameters are consistent with  Grade I diastolic dysfunction (impaired relaxation).  2. Right ventricular systolic function is normal. The right ventricular size is normal.  3. The mitral valve is normal in structure. Trivial mitral valve regurgitation. No evidence of mitral stenosis.  4. The aortic valve is normal in structure. Aortic valve regurgitation is not visualized. No aortic stenosis is present. FINDINGS  Left Ventricle: Left ventricular ejection fraction, by estimation, is 70 to 75%. The left ventricle has hyperdynamic function. The left ventricle has no regional wall motion abnormalities. The left ventricular internal cavity size was normal in size. There is mild concentric left ventricular hypertrophy. Left ventricular diastolic parameters are consistent with Grade I diastolic dysfunction (impaired relaxation). Right Ventricle: The right ventricular size is normal. No increase in right ventricular wall thickness. Right ventricular systolic function is normal. Left Atrium: Left atrial size was normal in size. Right Atrium: Right atrial size was normal in size. Pericardium: There is no evidence of pericardial effusion. Mitral Valve: The mitral valve is normal in structure. Trivial mitral valve regurgitation. No evidence of mitral valve stenosis. Tricuspid Valve: The tricuspid valve is normal in structure.  Tricuspid valve regurgitation is trivial. No evidence of tricuspid stenosis. Aortic Valve: The aortic valve is normal in structure. Aortic valve regurgitation is not visualized. No aortic stenosis is present. Aortic valve mean gradient measures 3.0 mmHg. Aortic valve peak gradient measures 4.9 mmHg. Pulmonic Valve: The pulmonic valve was normal in structure. Pulmonic valve regurgitation is mild to moderate. No evidence of pulmonic stenosis. Aorta: The aortic root is normal in size and structure. Venous: The inferior vena cava was not well visualized. IAS/Shunts: No atrial level shunt detected by color flow Doppler.  LEFT VENTRICLE PLAX  2D LVIDd:         4.60 cm     Diastology LVIDs:         2.70 cm     LV e' medial:    7.72 cm/s LV PW:         1.20 cm     LV E/e' medial:  7.7 LV IVS:        1.20 cm     LV e' lateral:   9.79 cm/s LVOT diam:     1.90 cm     LV E/e' lateral: 6.1 LVOT Area:     2.84 cm  LV Volumes (MOD) LV vol d, MOD A2C: 36.8 ml LV vol d, MOD A4C: 43.6 ml LV vol s, MOD A2C: 12.8 ml LV vol s, MOD A4C: 12.0 ml LV SV MOD A2C:     24.0 ml LV SV MOD A4C:     43.6 ml LV SV MOD BP:      27.9 ml LEFT ATRIUM             Index LA diam:        2.90 cm 1.68 cm/m LA Vol (A2C):   27.3 ml 15.82 ml/m LA Vol (A4C):   25.8 ml 14.95 ml/m LA Biplane Vol: 26.5 ml 15.36 ml/m  AORTIC VALVE AV Vmax:      111.00 cm/s AV Vmean:     83.000 cm/s AV VTI:       0.207 m AV Peak Grad: 4.9 mmHg AV Mean Grad: 3.0 mmHg  AORTA Ao Root diam: 3.30 cm Ao Asc diam:  3.20 cm MITRAL VALVE MV Area (PHT): 4.41 cm    SHUNTS MV Decel Time: 172 msec    Systemic Diam: 1.90 cm MV E velocity: 59.70 cm/s MV A velocity: 95.90 cm/s MV E/A ratio:  0.62 Toribio Fuel MD Electronically signed by Toribio Fuel MD Signature Date/Time: 07/31/2024/3:50:59 PM    Final    CT CHEST WO CONTRAST Result Date: 07/30/2024 CLINICAL DATA:  Respiratory distress. EXAM: CT CHEST WITHOUT CONTRAST TECHNIQUE: Multidetector CT imaging of the chest was performed following the standard protocol without IV contrast. RADIATION DOSE REDUCTION: This exam was performed according to the departmental dose-optimization program which includes automated exposure control, adjustment of the mA and/or kV according to patient size and/or use of iterative reconstruction technique. COMPARISON:  07/30/2024 at 2:08 a.m. FINDINGS: Positioning is somewhat suboptimal due to patient's kyphosis and difficulty positioning prior to scanning. Cardiovascular: Mild stable cardiomegaly. Calcified plaque over the left main and 3 vessel coronary arteries. Thoracic aorta is normal caliber. There is calcified plaque throughout the  thoracic aorta. Pulmonary arteries are unremarkable on this noncontrast exam. Mediastinum/Nodes: No mediastinal or hilar adenopathy. Remaining mediastinal structures are unremarkable. Lungs/Pleura: Moderate stable elevation of the right hemidiaphragm. Right apical pleural thickening unchanged. Atelectasis over the right lung base unchanged. No effusion. Near resolution of previously seen patchy hazy central lung  attenuation suggesting resolved interstitial edema. Airways are unremarkable. Upper Abdomen: No acute findings.  Unchanged from recent prior exam. Musculoskeletal: Unchanged. IMPRESSION: 1. Interval resolution of hazy patchy bilateral ground-glass opacification suggesting resolved interstitial edema. 2. Stable mild cardiomegaly. Atherosclerotic coronary artery disease. 3. Aortic atherosclerosis. Aortic Atherosclerosis (ICD10-I70.0). Electronically Signed   By: Toribio Agreste M.D.   On: 07/30/2024 14:13    Scheduled Meds:  amLODipine   5 mg Oral QHS   aspirin   81 mg Oral Daily   [START ON 08/02/2024] azithromycin   500 mg Oral Daily   feeding supplement (GLUCERNA SHAKE)  237 mL Oral TID BM   memantine   10 mg Oral BID   multivitamin with minerals  1 tablet Oral Daily   thiamine   100 mg Oral Daily   Continuous Infusions:  cefTRIAXone  (ROCEPHIN )  IV 2 g (07/31/24 1327)     LOS: 2 days   Ivonne Mustache, MD [facility] P1/02/2025, 1:09 PM  "

## 2024-08-01 NOTE — Progress Notes (Signed)
 OT Cancellation Note  Patient Details Name: Sherri Gross MRN: [id number] DOB: January 06, 1946   Cancelled Treatment:    Reason Eval/Treat Not Completed: (P) OT screened, no needs identified, will sign off. Pt at baseline, total A for all ADLs/mobility, not able to participate meaningfully, has fulltime caregivers for all needs, no further acute OT needs. Nursing staff ensuring Pt is positioned in bed safely/comfortably and turned every 2 hours, pillows placed between knees and on sides to prevent bed sores. Signing off.  Elouise JONELLE Bott 08/01/2024, 11:48 AM

## 2024-08-01 NOTE — Progress Notes (Signed)
 PHARMACY - ANTICOAGULATION CONSULT NOTE  Pharmacy Consult for heparin  Indication: chest pain/ACS  Allergies[1]  Patient Measurements: Height: 5' 5 (165.1 cm) Weight: 65.8 kg (145 lb) IBW/kg (Calculated) : 57 HEPARIN  DW (KG): 65.8  Vital Signs: Temp: 99.3 F (37.4 C) (01/08 0632) Temp Source: Axillary (01/08 9367) BP: 159/78 (01/08 9367) Pulse Rate: 85 (01/08 0632)  Labs: Recent Labs    07/29/24 2259 07/29/24 2323 07/30/24 0408 07/30/24 1508 07/31/24 0005 07/31/24 0443 07/31/24 0922 08/01/24 0532  HGB 13.3   < > 13.3  --   --  13.1  --  12.3  HCT 42.6   < > 39.0  --   --  39.0  --  36.8  PLT 224  --   --   --   --  211  --  197  HEPARINUNFRC  --   --   --    < > 0.98*  --  0.45 0.34  CREATININE 0.76  --   --   --   --  0.52  --  0.61   < > = values in this interval not displayed.    Estimated Creatinine Clearance: 51.3 mL/min (by C-G formula based on SCr of 0.61 mg/dL).  Assessment: 80 yoF presented after being found unconscious w/ elevated troponin. Pharmacy consulted to dose heparin  for ACS. No AC PTA.  Heparin  level therapeutic, CBC ok.  Goal of Therapy:  Heparin  level 0.3-0.7 units/ml Monitor platelets by anticoagulation protocol: Yes   Plan:  Continue IV Heparin  600 units/hr Daily heparin  level and CBC  Harlene Barlow, Berdine BIRCH, BCPS, BCCP Clinical Pharmacist  08/01/2024 7:45 AM   [facility] pharmacy phone numbers are listed on [email or web address]             [1]  Allergies Allergen Reactions   Galantamine     Unknown per mar   Tuberculin Ppd     unknown

## 2024-08-01 NOTE — Progress Notes (Signed)
 At 1612 pt desatting to 88% on room air. Pt placed on 4L nasal cannula with no improvement. Pt then placed on simple face mask on 2L, sats improved to 95%. Notified Adhikari, MD. Orders placed for PRN duoneb  treatment at 1624.

## 2024-08-02 DIAGNOSIS — I214 Non-ST elevation (NSTEMI) myocardial infarction: Secondary | ICD-10-CM | POA: Diagnosis not present

## 2024-08-02 LAB — BASIC METABOLIC PANEL WITH GFR
Anion gap: 13 (ref 5–15)
BUN: 21 mg/dL (ref 8–23)
CO2: 23 mmol/L (ref 22–32)
Calcium: 9 mg/dL (ref 8.9–10.3)
Chloride: 106 mmol/L (ref 98–111)
Creatinine, Ser: 0.53 mg/dL (ref 0.44–1.00)
GFR, Estimated: >60 mL/min
Glucose, Bld: 121 mg/dL — ABNORMAL HIGH (ref 70–99)
Potassium: 3.6 mmol/L (ref 3.5–5.1)
Sodium: 141 mmol/L (ref 135–145)

## 2024-08-02 LAB — HEPARIN LEVEL (UNFRACTIONATED): Heparin Unfractionated: <0.1 [IU]/mL — ABNORMAL LOW (ref 0.30–0.70)

## 2024-08-02 NOTE — Progress Notes (Signed)
 " PROGRESS NOTE  Sherri Gross  FMW:[id number] DOB: [birth year] DOA: 07/29/2024 PCP: Hilma Philis HERO   Brief Narrative: Patient is a 79 year old female with history of dementia, hearing loss, hyperlipidemia, hypertension, diabetes type 2 who presented with shortness of breath, loss of consciousness.  She was brought from nursing facility.  Found to have altered mentation and found to have new oxygen requirement.  On presentation, she was hypertensive, tachypneic, hypoxic requiring nonrebreather.  Labs with WBC count 15.2, elevated troponin, elevated lactate level.  Respiratory viral panel showed parainfluenza virus 1.  CT chest showed cardiomegaly with central venous congestion, mild interstitial edema bilaterally, patchy ground glass upper lobe opacities consistent with edema/pneumonitis.  Patient started on antibiotics for community-acquired pneumonia.  Cardiology consulted.  Started on heparin  drip for NSTEMI. NSTEMI is less likely, echo showed normal EF.  Heparin  drip discontinued.  Patient has improved clinically.  Medically stable for discharge to SNF.  Can be discharged only on Monday.  TOC following  Assessment & Plan:  Principal Problem:   NSTEMI (non-ST elevated myocardial infarction) (HCC) Active Problems:   Malnutrition of moderate degree   NSTEMI: Presented with elevated troponin.  Cardiology were following.  Was on heparin  drip.  Patient is nonverbal.  Does not seem that she has active chest pain at present.  Looks comfortable this morning.  Echo showed EF of 70-75%, no wall motion abnormality, grade 1 diastolic function.  Heparin  drip discontinued.  Cardiology signed off  Acute hypoxic respiratory failure: Likely from pneumonia/parainfluenza virus infection.  Now weaned to room air. not on oxygen at baseline  Community-acquired pneumonia/elevated lactate/leukocytosis: Chest x-ray consistent with right upper lobe pneumonitis/aspiration.  Speech therapy consulted and following.  On  dysphagia diet.  Continue current antibiotics.  Plan for 5 days course.  Follow-up cultures: NGTD.  Continue bronchodilators as needed.  Afebrile now  Hypertension: Takes amlodipine  2.5 mg daily.  Hypotensive on arrival.  Now hypertensive.  Amlodipine  restarted at 10 mg daily.   Hypokalemia: Supplemented with potassium  Debility/deconditioning/dementia/hearing loss: Patient lives at nursing facility.  Will consult PT/OT.  Continue memantine .  She is nonambulatory.  She has contractures of bilateral upper extremities   Nutrition Problem: Moderate Malnutrition Etiology: social / environmental circumstances    DVT prophylaxis:SCDs Start: 07/30/24 0758     Code Status: Full Code  Family Communication: Called and discussed with sister [name] on phone on 1/8  Patient status:Inpatient  Patient is from :SNF  Anticipated discharge to:SNF  Estimated DC date :whenever possible   Consultants: Cardiology  Procedures:None  Antimicrobials:  Anti-infectives (From admission, onward)    Start     Dose/Rate Route Frequency Ordered Stop   08/02/24 1000  azithromycin  (ZITHROMAX ) tablet 500 mg        500 mg Oral Daily 08/01/24 1308 08/02/24 1059   07/31/24 1345  cefTRIAXone  (ROCEPHIN ) 2 g in sodium chloride  0.9 % 100 mL IVPB        2 g 200 mL/hr over 30 Minutes Intravenous Every 24 hours 07/31/24 1256 08/04/24 1259   07/31/24 1200  azithromycin  (ZITHROMAX ) tablet 500 mg        500 mg Oral Daily 07/31/24 0850 08/01/24 0914   07/30/24 1300  cefTRIAXone  (ROCEPHIN ) 1 g in sodium chloride  0.9 % 100 mL IVPB  Status:  Discontinued        1 g 200 mL/hr over 30 Minutes Intravenous Every 24 hours 07/30/24 1247 07/31/24 1256   07/30/24 1300  azithromycin  (ZITHROMAX ) 500 mg in sodium chloride  0.9 %  250 mL IVPB  Status:  Discontinued        500 mg 250 mL/hr over 60 Minutes Intravenous Every 24 hours 07/30/24 1247 07/31/24 0850   07/30/24 0015  ceFEPIme  (MAXIPIME ) 2 g in sodium chloride  0.9 % 100 mL  IVPB        2 g 200 mL/hr over 30 Minutes Intravenous  Once 07/30/24 0011 07/30/24 0215   07/30/24 0015  vancomycin  (VANCOREADY) IVPB 1500 mg/300 mL        1,500 mg 150 mL/hr over 120 Minutes Intravenous  Once 07/30/24 0011 07/30/24 0437       Subjective: Patient seen and examined at bedside today.  Hemodynamically stable.  Lying on bed.  On room air this morning.  Does not appear to be coughing or short of breath.  Appears comfortable  Objective: Vitals:   08/02/24 0803 08/02/24 0804 08/02/24 0924 08/02/24 1129  BP: (!) 144/77   132/72  Pulse: 87   99  Resp: 19 20  (!) 23  Temp:  99.5 F (37.5 C)  99.4 F (37.4 C)  TempSrc: Oral   Oral  SpO2: 94%  94% 94%  Weight:      Height:        Intake/Output Summary (Last 24 hours) at 08/02/2024 1212 Last data filed at 08/02/2024 0418 Gross per 24 hour  Intake 0 ml  Output 450 ml  Net -450 ml   Filed Weights   07/29/24 2257  Weight: 65.8 kg    Examination:   General exam: Overall comfortable, not in distress, lying in bed, chronically deconditioned HEENT: PERRL Respiratory system:  no wheezes or crackles  Cardiovascular system: S1 & S2 heard, RRR.  Gastrointestinal system: Abdomen is nondistended, soft and nontender. Central nervous system: Alert and awake but not oriented Extremities: No edema, no clubbing ,no cyanosis, contractures of bilateral upper extremities Skin: No rashes, no ulcers,no icterus     Data Reviewed: I have personally reviewed following labs and imaging studies  CBC: Recent Labs  Lab 07/29/24 2259 07/29/24 2323 07/30/24 0408 07/31/24 0443 08/01/24 0532  WBC 15.2*  --   --  6.2 5.6  NEUTROABS 13.0*  --   --   --   --   HGB 13.3 13.6 13.3 13.1 12.3  HCT 42.6 40.0 39.0 39.0 36.8  MCV 95.1  --   --  89.0 88.9  PLT 224  --   --  211 197   Basic Metabolic Panel: Recent Labs  Lab 07/29/24 2259 07/29/24 2323 07/30/24 0408 07/31/24 0443 07/31/24 1413 08/01/24 0532 08/02/24 0513  NA 142 145  143 139  --  141 141  K 3.7 3.4* 3.6 3.0*  --  3.4* 3.6  CL 107  --   --  103  --  106 106  CO2 20*  --   --  21*  --  24 23  GLUCOSE 208*  --   --  81  --  116* 121*  BUN 31*  --   --  13  --  18 21  CREATININE 0.76  --   --  0.52  --  0.61 0.53  CALCIUM 8.8*  --   --  8.9  --  9.0 9.0  MG  --   --   --   --  1.6*  --   --      Recent Results (from the past 240 hours)  Blood culture (routine x 2)     Status: None (Preliminary  result)   Collection Time: 07/30/24 12:38 AM   Specimen: BLOOD RIGHT ARM  Result Value Ref Range Status   Specimen Description BLOOD RIGHT ARM  Final   Special Requests   Final    BOTTLES DRAWN AEROBIC AND ANAEROBIC Blood Culture results may not be optimal due to an inadequate volume of blood received in culture bottles   Culture   Final    NO GROWTH 3 DAYS Performed at [facility], [address] [place], [place] [address]    Report Status PENDING  Incomplete  Blood culture (routine x 2)     Status: None (Preliminary result)   Collection Time: 07/30/24 12:42 AM   Specimen: BLOOD LEFT ARM  Result Value Ref Range Status   Specimen Description BLOOD LEFT ARM  Final   Special Requests   Final    BOTTLES DRAWN AEROBIC AND ANAEROBIC Blood Culture results may not be optimal due to an inadequate volume of blood received in culture bottles   Culture   Final    NO GROWTH 3 DAYS Performed at [facility], [address], [place], [place] [address]    Report Status PENDING  Incomplete  Resp panel by RT-PCR (RSV, Flu A&B, Covid) Anterior Nasal Swab     Status: None   Collection Time: 07/30/24  1:19 AM   Specimen: Anterior Nasal Swab  Result Value Ref Range Status   SARS Coronavirus 2 by RT PCR NEGATIVE NEGATIVE Final   Influenza A by PCR NEGATIVE NEGATIVE Final   Influenza B by PCR NEGATIVE NEGATIVE Final    Comment: (NOTE) The Xpert Xpress SARS-CoV-2/FLU/RSV plus assay is intended as an aid in the diagnosis of influenza from Nasopharyngeal swab  specimens and should not be used as a sole basis for treatment. Nasal washings and aspirates are unacceptable for Xpert Xpress SARS-CoV-2/FLU/RSV testing.  Fact Sheet for Patients: bloggercourse.com  Fact Sheet for Healthcare Providers: seriousbroker.it  This test is not yet approved or cleared by the [place]  FDA and has been authorized for detection and/or diagnosis of SARS-CoV-2 by FDA under an Emergency Use Authorization (EUA). This EUA will remain in effect (meaning this test can be used) for the duration of the COVID-19 declaration under Section 564(b)(1) of the Act, 21 U.S.C. section 360bbb-3(b)(1), unless the authorization is terminated or revoked.     Resp Syncytial Virus by PCR NEGATIVE NEGATIVE Final    Comment: (NOTE) Fact Sheet for Patients: bloggercourse.com  Fact Sheet for Healthcare Providers: seriousbroker.it  This test is not yet approved or cleared by the [place]  FDA and has been authorized for detection and/or diagnosis of SARS-CoV-2 by FDA under an Emergency Use Authorization (EUA). This EUA will remain in effect (meaning this test can be used) for the duration of the COVID-19 declaration under Section 564(b)(1) of the Act, 21 U.S.C. section 360bbb-3(b)(1), unless the authorization is terminated or revoked.  Performed at [facility], [address], [place], [place] [address]   Respiratory (~20 pathogens) panel by PCR     Status: Abnormal   Collection Time: 07/30/24 12:43 PM   Specimen: Nasopharyngeal Swab; Respiratory  Result Value Ref Range Status   Adenovirus NOT DETECTED NOT DETECTED Final   Coronavirus 229E NOT DETECTED NOT DETECTED Final    Comment: (NOTE) The Coronavirus on the Respiratory Panel, DOES NOT test for the novel  Coronavirus (2019 nCoV)    Coronavirus HKU1 NOT DETECTED NOT DETECTED Final   Coronavirus NL63 NOT  DETECTED  NOT DETECTED Final   Coronavirus OC43 NOT DETECTED NOT DETECTED Final   Metapneumovirus NOT DETECTED NOT DETECTED Final   Rhinovirus / Enterovirus NOT DETECTED NOT DETECTED Final   Influenza A NOT DETECTED NOT DETECTED Final   Influenza B NOT DETECTED NOT DETECTED Final   Parainfluenza Virus 1 DETECTED (A) NOT DETECTED Final   Parainfluenza Virus 2 NOT DETECTED NOT DETECTED Final   Parainfluenza Virus 3 NOT DETECTED NOT DETECTED Final   Parainfluenza Virus 4 NOT DETECTED NOT DETECTED Final   Respiratory Syncytial Virus NOT DETECTED NOT DETECTED Final   Bordetella pertussis NOT DETECTED NOT DETECTED Final   Bordetella Parapertussis NOT DETECTED NOT DETECTED Final   Chlamydophila pneumoniae NOT DETECTED NOT DETECTED Final   Mycoplasma pneumoniae NOT DETECTED NOT DETECTED Final    Comment: Performed at [facility], [address] [place], [place] [address]     Radiology Studies: ECHOCARDIOGRAM COMPLETE Result Date: 07/31/2024    ECHOCARDIOGRAM REPORT   Patient Name:   Sherri Gross Date of Exam: 07/31/2024 Medical Rec #:  [id number]       Height:       65.0 in Accession #:    [id number]      Weight:       145.0 lb Date of Birth:  25-Jul-1946      BSA:          1.725 m Patient Age:    78 years        BP:           125/67 mmHg Patient Gender: F               HR:           102 bpm. Exam Location:  Inpatient Procedure: 2D Echo, Cardiac Doppler and Color Doppler (Both Spectral and Color            Flow Doppler were utilized during procedure). Indications:    NSTEMI I21.4  History:        Patient has no prior history of Echocardiogram examinations.                 Risk Factors:Hypertension.  Sonographer:    Tinnie Gosling RDCS Referring Phys: [id number] LOGAN N LOCKWOOD IMPRESSIONS  1. Left ventricular ejection fraction, by estimation, is 70 to 75%. The left ventricle has hyperdynamic function. The left ventricle has no regional wall motion abnormalities. There is mild concentric left  ventricular hypertrophy. Left ventricular diastolic parameters are consistent with Grade I diastolic dysfunction (impaired relaxation).  2. Right ventricular systolic function is normal. The right ventricular size is normal.  3. The mitral valve is normal in structure. Trivial mitral valve regurgitation. No evidence of mitral stenosis.  4. The aortic valve is normal in structure. Aortic valve regurgitation is not visualized. No aortic stenosis is present. FINDINGS  Left Ventricle: Left ventricular ejection fraction, by estimation, is 70 to 75%. The left ventricle has hyperdynamic function. The left ventricle has no regional wall motion abnormalities. The left ventricular internal cavity size was normal in size. There is mild concentric left ventricular hypertrophy. Left ventricular diastolic parameters are consistent with Grade I diastolic dysfunction (impaired relaxation). Right Ventricle: The right ventricular size is normal. No increase in right ventricular wall thickness. Right ventricular systolic function is normal. Left Atrium: Left atrial size was normal in size. Right Atrium: Right atrial size was normal in size. Pericardium: There is no evidence of pericardial effusion. Mitral Valve: The mitral valve is  normal in structure. Trivial mitral valve regurgitation. No evidence of mitral valve stenosis. Tricuspid Valve: The tricuspid valve is normal in structure. Tricuspid valve regurgitation is trivial. No evidence of tricuspid stenosis. Aortic Valve: The aortic valve is normal in structure. Aortic valve regurgitation is not visualized. No aortic stenosis is present. Aortic valve mean gradient measures 3.0 mmHg. Aortic valve peak gradient measures 4.9 mmHg. Pulmonic Valve: The pulmonic valve was normal in structure. Pulmonic valve regurgitation is mild to moderate. No evidence of pulmonic stenosis. Aorta: The aortic root is normal in size and structure. Venous: The inferior vena cava was not well visualized.  IAS/Shunts: No atrial level shunt detected by color flow Doppler.  LEFT VENTRICLE PLAX 2D LVIDd:         4.60 cm     Diastology LVIDs:         2.70 cm     LV e' medial:    7.72 cm/s LV PW:         1.20 cm     LV E/e' medial:  7.7 LV IVS:        1.20 cm     LV e' lateral:   9.79 cm/s LVOT diam:     1.90 cm     LV E/e' lateral: 6.1 LVOT Area:     2.84 cm  LV Volumes (MOD) LV vol d, MOD A2C: 36.8 ml LV vol d, MOD A4C: 43.6 ml LV vol s, MOD A2C: 12.8 ml LV vol s, MOD A4C: 12.0 ml LV SV MOD A2C:     24.0 ml LV SV MOD A4C:     43.6 ml LV SV MOD BP:      27.9 ml LEFT ATRIUM             Index LA diam:        2.90 cm 1.68 cm/m LA Vol (A2C):   27.3 ml 15.82 ml/m LA Vol (A4C):   25.8 ml 14.95 ml/m LA Biplane Vol: 26.5 ml 15.36 ml/m  AORTIC VALVE AV Vmax:      111.00 cm/s AV Vmean:     83.000 cm/s AV VTI:       0.207 m AV Peak Grad: 4.9 mmHg AV Mean Grad: 3.0 mmHg  AORTA Ao Root diam: 3.30 cm Ao Asc diam:  3.20 cm MITRAL VALVE MV Area (PHT): 4.41 cm    SHUNTS MV Decel Time: 172 msec    Systemic Diam: 1.90 cm MV E velocity: 59.70 cm/s MV A velocity: 95.90 cm/s MV E/A ratio:  0.62 Toribio Fuel MD Electronically signed by Toribio Fuel MD Signature Date/Time: 07/31/2024/3:50:59 PM    Final     Scheduled Meds:  amLODipine   10 mg Oral QHS   aspirin   81 mg Oral Daily   feeding supplement (GLUCERNA SHAKE)  237 mL Oral TID BM   memantine   10 mg Oral BID   multivitamin with minerals  1 tablet Oral Daily   polyethylene glycol  17 g Oral Daily   senna-docusate  1 tablet Oral BID   thiamine   100 mg Oral Daily   Continuous Infusions:  cefTRIAXone  (ROCEPHIN )  IV Stopped (08/01/24 2158)     LOS: 3 days   Ivonne Mustache, MD [facility] P1/03/2025, 12:12 PM  "

## 2024-08-02 NOTE — Plan of Care (Signed)
 ?  Problem: Clinical Measurements: ?Goal: Respiratory complications will improve ?Outcome: Progressing ?Goal: Cardiovascular complication will be avoided ?Outcome: Progressing ?  ?Problem: Safety: ?Goal: Ability to remain free from injury will improve ?Outcome: Progressing ?  ?

## 2024-08-02 NOTE — TOC Initial Note (Addendum)
 Transition of Care [facility]) - Initial/Assessment Note    Patient Details  Name: Sherri Gross MRN: [id number] Date of Birth: [birth year]  [facility] CM/SW Contact:    Sherri Gross, LCSWA Phone Number: 08/02/2024, 11:27 AM  Clinical Narrative:                      CSW spoke with patients legal guardian Sherri Gross who informed CSW that patient comes from [facility]. Sherri Gross confirmed plan is for patient to return when medically ready. Patients legal guardian informed CSW that facility has a fleeta but they may want patient to return by PTAR. All questions answered. No further questions reported at this time.CSW LVM for Sherri Gross with [facility]. CSW awaiting call back to confirm if patient can return back today. CSW will continue to follow.  Update- CSW received call from Sherri Gross with [facility] who informed CSW that facility unable to accept patient today and over weekend. Sherri Gross informed CSW that facility does not have the necessary care for patient to receive treatment, [name] RN with [facility] informed CSW that facility can accept patient on Monday. Facility will provide transportation and confirmed that all they will need at dc is dc summary. No FL2 needed. CSW informed MD.   Patient Goals and CMS Choice            Expected Discharge Plan and Services                                              Prior Living Arrangements/Services                       Activities of Daily Living   ADL Screening (condition at time of admission) Independently performs ADLs?: No Does the patient have a NEW difficulty with bathing/dressing/toileting/self-feeding that is expected to last >3 days?: No Does the patient have a NEW difficulty with getting in/out of bed, walking, or climbing stairs that is expected to last >3 days?: No Does the patient have a NEW difficulty with communication that is expected to last >3 days?: No Is the patient deaf or have difficulty hearing?:  Yes Does the patient have difficulty seeing, even when wearing glasses/contacts?: Yes Does the patient have difficulty concentrating, remembering, or making decisions?: Yes  Permission Sought/Granted                  Emotional Assessment              Admission diagnosis:  Hypoxemia [R09.02] NSTEMI (non-ST elevated myocardial infarction) [facility]) [I21.4] Patient Active Problem List   Diagnosis Date Noted   Malnutrition of moderate degree 08/01/2024   NSTEMI (non-ST elevated myocardial infarction) (HCC) 07/30/2024   Acute encephalopathy 03/03/2020   COVID-19 03/03/2020   Pneumonia 03/03/2020   Memory loss 07/27/2016   Left spastic hemiparesis (HCC) 03/27/2016   Dementia (HCC) 12/22/2009   Intellectual disability 12/22/2009   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   HYPERLIPIDEMIA 11/27/2009   PCP:  Hilma Philis HERO Pharmacy:   [id number] - [place], [place] - [address] AT [facility] [address] [place] [address] Phone: [phone] Fax: [phone]     Social Drivers of Health (SDOH) Social History: SDOH Screenings   Food Insecurity: No Food Insecurity (07/30/2024)  Housing: Low Risk (07/30/2024)  Transportation Needs:  No Transportation Needs (07/30/2024)  Utilities: Not At Risk (07/30/2024)  Social Connections: Socially Isolated (07/30/2024)  Tobacco Use: Low Risk (07/30/2024)   SDOH Interventions:     Readmission Risk Interventions     No data to display

## 2024-08-03 DIAGNOSIS — I214 Non-ST elevation (NSTEMI) myocardial infarction: Secondary | ICD-10-CM | POA: Diagnosis not present

## 2024-08-03 NOTE — Progress Notes (Signed)
 " PROGRESS NOTE  Sherri Gross  FMW:[id number] DOB: October 17, 1945 DOA: 07/29/2024 PCP: Hilma Philis HERO   Brief Narrative: Patient is a 79 year old female with history of dementia, hearing loss, hyperlipidemia, hypertension, diabetes type 2 who presented with shortness of breath, loss of consciousness.  She was brought from nursing facility.  Found to have altered mentation and found to have new oxygen requirement.  On presentation, she was hypertensive, tachypneic, hypoxic requiring nonrebreather.  Labs with WBC count 15.2, elevated troponin, elevated lactate level.  Respiratory viral panel showed parainfluenza virus 1.  CT chest showed cardiomegaly with central venous congestion, mild interstitial edema bilaterally, patchy ground glass upper lobe opacities consistent with edema/pneumonitis.  Patient started on antibiotics for community-acquired pneumonia.  Cardiology consulted.  Started on heparin  drip for NSTEMI. NSTEMI is less likely, echo showed normal EF.  Heparin  drip discontinued.  Patient has improved clinically.  Medically stable for discharge to SNF.  Can be discharged only on Monday.  TOC following  Assessment & Plan:  Principal Problem:   NSTEMI (non-ST elevated myocardial infarction) (HCC) Active Problems:   Malnutrition of moderate degree   NSTEMI: Presented with elevated troponin.  Cardiology were following.  Was on heparin  drip.  Patient is nonverbal.  Does not seem that she has active chest pain at present.  Looks comfortable this morning.  Echo showed EF of 70-75%, no wall motion abnormality, grade 1 diastolic function.  Heparin  drip discontinued.  Cardiology signed off  Acute hypoxic respiratory failure: Likely from pneumonia/parainfluenza virus infection.  Now weaned to room air. not on oxygen at baseline  Community-acquired pneumonia/elevated lactate/leukocytosis: Chest x-ray consistent with right upper lobe pneumonitis/aspiration.  Speech therapy consulted and following.  On  dysphagia diet.  Continue current antibiotics.  Plan for 5 days course.  Follow-up cultures: NGTD.  Continue bronchodilators as needed.  Afebrile now  Hypertension: Takes amlodipine  2.5 mg daily.  Hypotensive on arrival.  Now hypertensive.  Amlodipine  restarted at 10 mg daily.   Hypokalemia: Supplemented with potassium  Debility/deconditioning/dementia/hearing loss: Patient lives at nursing facility.  Will consult PT/OT.  Continue memantine .  She is nonambulatory.  She has contractures of bilateral upper extremities   Nutrition Problem: Moderate Malnutrition Etiology: social / environmental circumstances    DVT prophylaxis:SCDs Start: 07/30/24 0758     Code Status: Full Code  Family Communication: Called and discussed with sister [name] on phone on 1/8  Patient status:Inpatient  Patient is from :SNF  Anticipated discharge to:SNF  Estimated DC date :whenever possible.  Bed available on Monday   Consultants: Cardiology  Procedures:None  Antimicrobials:  Anti-infectives (From admission, onward)    Start     Dose/Rate Route Frequency Ordered Stop   08/02/24 1000  azithromycin  (ZITHROMAX ) tablet 500 mg        500 mg Oral Daily 08/01/24 1308 08/02/24 1059   07/31/24 1345  cefTRIAXone  (ROCEPHIN ) 2 g in sodium chloride  0.9 % 100 mL IVPB        2 g 200 mL/hr over 30 Minutes Intravenous Every 24 hours 07/31/24 1256 08/04/24 1259   07/31/24 1200  azithromycin  (ZITHROMAX ) tablet 500 mg        500 mg Oral Daily 07/31/24 0850 08/01/24 0914   07/30/24 1300  cefTRIAXone  (ROCEPHIN ) 1 g in sodium chloride  0.9 % 100 mL IVPB  Status:  Discontinued        1 g 200 mL/hr over 30 Minutes Intravenous Every 24 hours 07/30/24 1247 07/31/24 1256   07/30/24 1300  azithromycin  (ZITHROMAX ) 500  mg in sodium chloride  0.9 % 250 mL IVPB  Status:  Discontinued        500 mg 250 mL/hr over 60 Minutes Intravenous Every 24 hours 07/30/24 1247 07/31/24 0850   07/30/24 0015  ceFEPIme  (MAXIPIME ) 2 g in  sodium chloride  0.9 % 100 mL IVPB        2 g 200 mL/hr over 30 Minutes Intravenous  Once 07/30/24 0011 07/30/24 0215   07/30/24 0015  vancomycin  (VANCOREADY) IVPB 1500 mg/300 mL        1,500 mg 150 mL/hr over 120 Minutes Intravenous  Once 07/30/24 0011 07/30/24 0437       Subjective: Patient seen and examined at bedside today.  Hemodynamically stable.  Comfortably lying in bed.  On room air.  Does not appear to be in any acute respite distress.  Not coughing.  No new issues  Objective: Vitals:   08/02/24 1928 08/02/24 2335 08/03/24 0417 08/03/24 0743  BP: (!) 140/70 (!) 145/74 131/65 137/67  Pulse: 92 91 82 81  Resp: (!) 25 (!) 23 18   Temp: 100 F (37.8 C) 99.4 F (37.4 C) 99 F (37.2 C) 98.2 F (36.8 C)  TempSrc: Oral Oral Oral Oral  SpO2: 91% 96% 96% 95%  Weight:      Height:        Intake/Output Summary (Last 24 hours) at 08/03/2024 1053 Last data filed at 08/03/2024 0456 Gross per 24 hour  Intake 0 ml  Output --  Net 0 ml   Filed Weights   07/29/24 2257  Weight: 65.8 kg    Examination:   General exam: Overall comfortable, not in distress, lying on  bed, chronically deconditioned HEENT: PERRL Respiratory system:  no wheezes or crackles  Cardiovascular system: S1 & S2 heard, RRR.  Gastrointestinal system: Abdomen is nondistended, soft and nontender. Central nervous system: Alert and oriented Extremities: No edema, no clubbing ,no cyanosis, contractures of bilateral upper extremities Skin: No rashes, no ulcers,no icterus     Data Reviewed: I have personally reviewed following labs and imaging studies  CBC: Recent Labs  Lab 07/29/24 2259 07/29/24 2323 07/30/24 0408 07/31/24 0443 08/01/24 0532  WBC 15.2*  --   --  6.2 5.6  NEUTROABS 13.0*  --   --   --   --   HGB 13.3 13.6 13.3 13.1 12.3  HCT 42.6 40.0 39.0 39.0 36.8  MCV 95.1  --   --  89.0 88.9  PLT 224  --   --  211 197   Basic Metabolic Panel: Recent Labs  Lab 07/29/24 2259 07/29/24 2323  07/30/24 0408 07/31/24 0443 07/31/24 1413 08/01/24 0532 08/02/24 0513  NA 142 145 143 139  --  141 141  K 3.7 3.4* 3.6 3.0*  --  3.4* 3.6  CL 107  --   --  103  --  106 106  CO2 20*  --   --  21*  --  24 23  GLUCOSE 208*  --   --  81  --  116* 121*  BUN 31*  --   --  13  --  18 21  CREATININE 0.76  --   --  0.52  --  0.61 0.53  CALCIUM 8.8*  --   --  8.9  --  9.0 9.0  MG  --   --   --   --  1.6*  --   --      Recent Results (from the past 240  hours)  Blood culture (routine x 2)     Status: None (Preliminary result)   Collection Time: 07/30/24 12:38 AM   Specimen: BLOOD RIGHT ARM  Result Value Ref Range Status   Specimen Description BLOOD RIGHT ARM  Final   Special Requests   Final    BOTTLES DRAWN AEROBIC AND ANAEROBIC Blood Culture results may not be optimal due to an inadequate volume of blood received in culture bottles   Culture   Final    NO GROWTH 4 DAYS Performed at [facility], [address], [place], [place] [address]    Report Status PENDING  Incomplete  Blood culture (routine x 2)     Status: None (Preliminary result)   Collection Time: 07/30/24 12:42 AM   Specimen: BLOOD LEFT ARM  Result Value Ref Range Status   Specimen Description BLOOD LEFT ARM  Final   Special Requests   Final    BOTTLES DRAWN AEROBIC AND ANAEROBIC Blood Culture results may not be optimal due to an inadequate volume of blood received in culture bottles   Culture   Final    NO GROWTH 4 DAYS Performed at [facility], [address] [place], [place] [address]    Report Status PENDING  Incomplete  Resp panel by RT-PCR (RSV, Flu A&B, Covid) Anterior Nasal Swab     Status: None   Collection Time: 07/30/24  1:19 AM   Specimen: Anterior Nasal Swab  Result Value Ref Range Status   SARS Coronavirus 2 by RT PCR NEGATIVE NEGATIVE Final   Influenza A by PCR NEGATIVE NEGATIVE Final   Influenza B by PCR NEGATIVE NEGATIVE Final    Comment: (NOTE) The Xpert Xpress SARS-CoV-2/FLU/RSV  plus assay is intended as an aid in the diagnosis of influenza from Nasopharyngeal swab specimens and should not be used as a sole basis for treatment. Nasal washings and aspirates are unacceptable for Xpert Xpress SARS-CoV-2/FLU/RSV testing.  Fact Sheet for Patients: bloggercourse.com  Fact Sheet for Healthcare Providers: seriousbroker.it  This test is not yet approved or cleared by the [place]  FDA and has been authorized for detection and/or diagnosis of SARS-CoV-2 by FDA under an Emergency Use Authorization (EUA). This EUA will remain in effect (meaning this test can be used) for the duration of the COVID-19 declaration under Section 564(b)(1) of the Act, 21 U.S.C. section 360bbb-3(b)(1), unless the authorization is terminated or revoked.     Resp Syncytial Virus by PCR NEGATIVE NEGATIVE Final    Comment: (NOTE) Fact Sheet for Patients: bloggercourse.com  Fact Sheet for Healthcare Providers: seriousbroker.it  This test is not yet approved or cleared by the [place]  FDA and has been authorized for detection and/or diagnosis of SARS-CoV-2 by FDA under an Emergency Use Authorization (EUA). This EUA will remain in effect (meaning this test can be used) for the duration of the COVID-19 declaration under Section 564(b)(1) of the Act, 21 U.S.C. section 360bbb-3(b)(1), unless the authorization is terminated or revoked.  Performed at [facility], [address] [place], [place] [address]   Respiratory (~20 pathogens) panel by PCR     Status: Abnormal   Collection Time: 07/30/24 12:43 PM   Specimen: Nasopharyngeal Swab; Respiratory  Result Value Ref Range Status   Adenovirus NOT DETECTED NOT DETECTED Final   Coronavirus 229E NOT DETECTED NOT DETECTED Final    Comment: (NOTE) The Coronavirus on the Respiratory Panel, DOES NOT test for the novel  Coronavirus  (2019 nCoV)  Coronavirus HKU1 NOT DETECTED NOT DETECTED Final   Coronavirus NL63 NOT DETECTED NOT DETECTED Final   Coronavirus OC43 NOT DETECTED NOT DETECTED Final   Metapneumovirus NOT DETECTED NOT DETECTED Final   Rhinovirus / Enterovirus NOT DETECTED NOT DETECTED Final   Influenza A NOT DETECTED NOT DETECTED Final   Influenza B NOT DETECTED NOT DETECTED Final   Parainfluenza Virus 1 DETECTED (A) NOT DETECTED Final   Parainfluenza Virus 2 NOT DETECTED NOT DETECTED Final   Parainfluenza Virus 3 NOT DETECTED NOT DETECTED Final   Parainfluenza Virus 4 NOT DETECTED NOT DETECTED Final   Respiratory Syncytial Virus NOT DETECTED NOT DETECTED Final   Bordetella pertussis NOT DETECTED NOT DETECTED Final   Bordetella Parapertussis NOT DETECTED NOT DETECTED Final   Chlamydophila pneumoniae NOT DETECTED NOT DETECTED Final   Mycoplasma pneumoniae NOT DETECTED NOT DETECTED Final    Comment: Performed at [facility], [address], [place], [place] [address]     Radiology Studies: No results found.   Scheduled Meds:  amLODipine   10 mg Oral QHS   aspirin   81 mg Oral Daily   feeding supplement (GLUCERNA SHAKE)  237 mL Oral TID BM   memantine   10 mg Oral BID   multivitamin with minerals  1 tablet Oral Daily   polyethylene glycol  17 g Oral Daily   senna-docusate  1 tablet Oral BID   thiamine   100 mg Oral Daily   Continuous Infusions:  cefTRIAXone  (ROCEPHIN )  IV 2 g (08/02/24 1517)     LOS: 4 days   Ivonne Mustache, MD [facility] P1/04/2025, 10:53 AM  "

## 2024-08-03 NOTE — Plan of Care (Signed)
  Problem: Clinical Measurements: Goal: Respiratory complications will improve Outcome: Progressing Goal: Cardiovascular complication will be avoided Outcome: Progressing   Problem: Pain Managment: Goal: General experience of comfort will improve and/or be controlled Outcome: Progressing   Problem: Safety: Goal: Ability to remain free from injury will improve Outcome: Progressing

## 2024-08-03 NOTE — Plan of Care (Signed)
" °  Problem: Elimination: Goal: Will not experience complications related to bowel motility Outcome: Not Progressing Note: Was given senna today  & miralax  ordered as well   "

## 2024-08-04 ENCOUNTER — Inpatient Hospital Stay (HOSPITAL_COMMUNITY)

## 2024-08-04 DIAGNOSIS — I214 Non-ST elevation (NSTEMI) myocardial infarction: Secondary | ICD-10-CM | POA: Diagnosis not present

## 2024-08-04 LAB — BASIC METABOLIC PANEL WITH GFR
Anion gap: 16 — ABNORMAL HIGH (ref 5–15)
BUN: 35 mg/dL — ABNORMAL HIGH (ref 8–23)
CO2: 21 mmol/L — ABNORMAL LOW (ref 22–32)
Calcium: 9.7 mg/dL (ref 8.9–10.3)
Chloride: 108 mmol/L (ref 98–111)
Creatinine, Ser: 0.64 mg/dL (ref 0.44–1.00)
GFR, Estimated: >60 mL/min
Glucose, Bld: 159 mg/dL — ABNORMAL HIGH (ref 70–99)
Potassium: 3.4 mmol/L — ABNORMAL LOW (ref 3.5–5.1)
Sodium: 145 mmol/L (ref 135–145)

## 2024-08-04 LAB — CULTURE, BLOOD (ROUTINE X 2)
Culture: NO GROWTH
Culture: NO GROWTH

## 2024-08-04 LAB — CBC
HCT: 36.2 % (ref 36.0–46.0)
Hemoglobin: 12.2 g/dL (ref 12.0–15.0)
MCH: 29.8 pg (ref 26.0–34.0)
MCHC: 33.7 g/dL (ref 30.0–36.0)
MCV: 88.3 fL (ref 80.0–100.0)
Platelets: 155 K/uL (ref 150–400)
RBC: 4.1 MIL/uL (ref 3.87–5.11)
RDW: 14.8 % (ref 11.5–15.5)
WBC: 16.9 K/uL — ABNORMAL HIGH (ref 4.0–10.5)
nRBC: 0 % (ref 0.0–0.2)

## 2024-08-04 LAB — LACTIC ACID, PLASMA: Lactic Acid, Venous: 1.5 mmol/L (ref 0.5–1.9)

## 2024-08-04 MED ORDER — MORPHINE SULFATE (PF) 2 MG/ML IV SOLN
2.0000 mg | INTRAVENOUS | Status: DC | PRN
  Administered 2024-08-04 – 2024-08-05 (×3): 2 mg via INTRAVENOUS
  Filled 2024-08-04 (×3): qty 1

## 2024-08-04 MED ORDER — OXYCODONE HCL 5 MG PO TABS: 5.0000 mg | ORAL_TABLET | Freq: Four times a day (QID) | ORAL | Status: DC | PRN

## 2024-08-04 MED ORDER — SODIUM CHLORIDE 0.9 % IV SOLN: INTRAVENOUS | Status: DC

## 2024-08-04 MED ORDER — POTASSIUM CHLORIDE 20 MEQ PO PACK
20.0000 meq | PACK | Freq: Once | ORAL | Status: AC
  Administered 2024-08-04: 20 meq via ORAL
  Filled 2024-08-04: qty 1

## 2024-08-04 MED ORDER — LACTATED RINGERS IV BOLUS
500.0000 mL | Freq: Once | INTRAVENOUS | Status: AC
  Administered 2024-08-04: 500 mL via INTRAVENOUS

## 2024-08-04 MED ORDER — ACETAMINOPHEN 325 MG PO TABS: 650.0000 mg | ORAL_TABLET | Freq: Four times a day (QID) | ORAL | Status: DC | PRN

## 2024-08-04 MED ORDER — POTASSIUM CHLORIDE CRYS ER 20 MEQ PO TBCR
40.0000 meq | EXTENDED_RELEASE_TABLET | Freq: Once | ORAL | Status: AC
  Administered 2024-08-04: 40 meq via ORAL
  Filled 2024-08-04: qty 2

## 2024-08-04 MED ORDER — SODIUM CHLORIDE 0.9 % IV SOLN: 2.0000 g | INTRAVENOUS | Status: DC

## 2024-08-04 MED ORDER — SODIUM CHLORIDE 0.9 % IV SOLN
2.0000 g | INTRAVENOUS | Status: DC
  Administered 2024-08-04 – 2024-08-05 (×2): 2 g via INTRAVENOUS
  Filled 2024-08-04 (×2): qty 20

## 2024-08-04 NOTE — Progress Notes (Signed)
 Ok for [employer] per MD to obtain urine

## 2024-08-04 NOTE — Progress Notes (Signed)
" °   08/04/24 0030  Provider Notification  Provider Name/Title Evalene RAMAN. Opyd MD  Date Provider Notified 08/04/24  Time Provider Notified 0030  Method of Notification Page  Notification Reason Other (Comment) ([facility] for Resp in 28-36/min)  Provider response See new orders (CXR and basic labs)  Date of Provider Response 08/04/24  Time of Provider Response 0035    "

## 2024-08-04 NOTE — Progress Notes (Signed)
 " PROGRESS NOTE  Sherri Gross  FMW:[id number] DOB: [birth year] DOA: 07/29/2024 PCP: Hilma Philis HERO   Brief Narrative: Patient is a 79 year old female with history of dementia, hearing loss, hyperlipidemia, hypertension, diabetes type 2 who presented with shortness of breath, loss of consciousness.  She was brought from nursing facility.  Found to have altered mentation and found to have new oxygen requirement.  On presentation, she was hypertensive, tachypneic, hypoxic requiring nonrebreather.  Labs with WBC count 15.2, elevated troponin, elevated lactate level.  Respiratory viral panel showed parainfluenza virus 1.  CT chest showed cardiomegaly with central venous congestion, mild interstitial edema bilaterally, patchy ground glass upper lobe opacities consistent with edema/pneumonitis.  Patient started on antibiotics for community-acquired pneumonia.  Cardiology consulted.  Started on heparin  drip for NSTEMI. NSTEMI is less likely, echo showed normal EF.  Heparin  drip discontinued.  Patient has improved clinically.  She was waiting to go to back to her SNF. Developed mild fever, sinus tachycardia this morning.  Restarted antibiotics  Assessment & Plan:  Principal Problem:   NSTEMI (non-ST elevated myocardial infarction) (HCC) Active Problems:   Malnutrition of moderate degree   NSTEMI: Presented with elevated troponin.  Cardiology were following.  Was on heparin  drip.  Patient is nonverbal.  Does not seem that she has active chest pain at present.  Looks comfortable this morning.  Echo showed EF of 70-75%, no wall motion abnormality, grade 1 diastolic function.  Heparin  drip discontinued.  Cardiology signed off  Acute hypoxic respiratory failure: Likely from pneumonia/parainfluenza virus infection.  She was weaned to room air but now back to 2 L of oxygen.  Appears mildly tachypneic this morning  Community-acquired pneumonia/elevated lactate/leukocytosis: Chest x-ray consistent with right  upper lobe pneumonitis/aspiration.  Speech therapy consulted and following.  On dysphagia diet. Follow-up cultures: NGTD.  Continue bronchodilators as needed.   She finished antibiotics course but had to be restarted again because she developed new onset mild grade fever, leukocytosis.  Chest x-ray did not show any pneumonia.  Will check UA.  Continue ceftriaxone  for now.  Hypertension: Takes amlodipine  2.5 mg daily.    Amlodipine  restarted at 10 mg daily.  Blood pressure stable  Hypokalemia: Supplemented with potassium and corrected  Debility/deconditioning/dementia/hearing loss: Patient lives at nursing facility.  Will consult PT/OT.  Continue memantine .  She is nonambulatory.  She has contractures of bilateral upper extremities   Nutrition Problem: Moderate Malnutrition Etiology: social / environmental circumstances    DVT prophylaxis:SCDs Start: 07/30/24 0758     Code Status: Full Code  Family Communication: Called and discussed with sister [name] on phone on 1/8  Patient status:Inpatient  Patient is from :SNF  Anticipated discharge to:SNF  Estimated DC date : 1 to 2 days   Consultants: Cardiology  Procedures:None  Antimicrobials:  Anti-infectives (From admission, onward)    Start     Dose/Rate Route Frequency Ordered Stop   08/04/24 1330  cefTRIAXone  (ROCEPHIN ) 2 g in sodium chloride  0.9 % 100 mL IVPB        2 g 200 mL/hr over 30 Minutes Intravenous Every 24 hours 08/04/24 0916     08/04/24 0900  cefTRIAXone  (ROCEPHIN ) 2 g in sodium chloride  0.9 % 100 mL IVPB  Status:  Discontinued        2 g 200 mL/hr over 30 Minutes Intravenous Every 24 hours 08/04/24 0812 08/04/24 0812   08/02/24 1000  azithromycin  (ZITHROMAX ) tablet 500 mg        500 mg Oral Daily 08/01/24  1308 08/02/24 1059   07/31/24 1345  cefTRIAXone  (ROCEPHIN ) 2 g in sodium chloride  0.9 % 100 mL IVPB        2 g 200 mL/hr over 30 Minutes Intravenous Every 24 hours 07/31/24 1256 08/03/24 2135   07/31/24  1200  azithromycin  (ZITHROMAX ) tablet 500 mg        500 mg Oral Daily 07/31/24 0850 08/01/24 0914   07/30/24 1300  cefTRIAXone  (ROCEPHIN ) 1 g in sodium chloride  0.9 % 100 mL IVPB  Status:  Discontinued        1 g 200 mL/hr over 30 Minutes Intravenous Every 24 hours 07/30/24 1247 07/31/24 1256   07/30/24 1300  azithromycin  (ZITHROMAX ) 500 mg in sodium chloride  0.9 % 250 mL IVPB  Status:  Discontinued        500 mg 250 mL/hr over 60 Minutes Intravenous Every 24 hours 07/30/24 1247 07/31/24 0850   07/30/24 0015  ceFEPIme  (MAXIPIME ) 2 g in sodium chloride  0.9 % 100 mL IVPB        2 g 200 mL/hr over 30 Minutes Intravenous  Once 07/30/24 0011 07/30/24 0215   07/30/24 0015  vancomycin  (VANCOREADY) IVPB 1500 mg/300 mL        1,500 mg 150 mL/hr over 120 Minutes Intravenous  Once 07/30/24 0011 07/30/24 0437       Subjective: Patient seen and examined at bedside today.  She appears different than yesterday.  She had marked fever last night of 100.2.  She has mild sinus tachycardia which is a new problem.  She also appears mildly tachypneic.  Lungs are clear to auscultation.  Chest x-ray was done last night which did not show any signs of pneumonia or fluid.  Currently requiring 2 L of oxygen per minute.  Not sure whether this is from pain or underlying infection.  Antibiotics restarted.  Getting UA  Objective: Vitals:   08/03/24 2359 08/04/24 0344 08/04/24 0823 08/04/24 0937  BP: (!) 140/70 (!) 162/88 134/65   Pulse: (!) 109 (!) 108 (!) 102 (!) 108  Resp: (!) 36 (!) 27 (!) 25 (!) 28  Temp: 97.9 F (36.6 C) 100.2 F (37.9 C) 100.1 F (37.8 C)   TempSrc: Oral Oral Axillary   SpO2: 93% 96% 96% 97%  Weight:      Height:        Intake/Output Summary (Last 24 hours) at 08/04/2024 1137 Last data filed at 08/04/2024 0500 Gross per 24 hour  Intake 51.5 ml  Output 200 ml  Net -148.5 ml   Filed Weights   07/29/24 2257  Weight: 65.8 kg    Examination:    General exam: Lying in bed,  chronically deconditioned, appears comfortable HEENT: PERRL Respiratory system:  no wheezes or crackles, mild tachypnea Cardiovascular system: Sinus tachycardia Gastrointestinal system: Abdomen is nondistended, soft and nontender. Central nervous system: Alert and awake, nonverbal Extremities: No edema, no clubbing ,no cyanosis, contractures of bilateral upper extremities Skin: No rashes, no ulcers,no icterus     Data Reviewed: I have personally reviewed following labs and imaging studies  CBC: Recent Labs  Lab 07/29/24 2259 07/29/24 2323 07/30/24 0408 07/31/24 0443 08/01/24 0532 08/04/24 0136  WBC 15.2*  --   --  6.2 5.6 16.9*  NEUTROABS 13.0*  --   --   --   --   --   HGB 13.3 13.6 13.3 13.1 12.3 12.2  HCT 42.6 40.0 39.0 39.0 36.8 36.2  MCV 95.1  --   --  89.0 88.9 88.3  PLT 224  --   --  211 197 155   Basic Metabolic Panel: Recent Labs  Lab 07/29/24 2259 07/29/24 2323 07/30/24 0408 07/31/24 0443 07/31/24 1413 08/01/24 0532 08/02/24 0513 08/04/24 0136  NA 142   < > 143 139  --  141 141 145  K 3.7   < > 3.6 3.0*  --  3.4* 3.6 3.4*  CL 107  --   --  103  --  106 106 108  CO2 20*  --   --  21*  --  24 23 21*  GLUCOSE 208*  --   --  81  --  116* 121* 159*  BUN 31*  --   --  13  --  18 21 35*  CREATININE 0.76  --   --  0.52  --  0.61 0.53 0.64  CALCIUM 8.8*  --   --  8.9  --  9.0 9.0 9.7  MG  --   --   --   --  1.6*  --   --   --    < > = values in this interval not displayed.     Recent Results (from the past 240 hours)  Blood culture (routine x 2)     Status: None   Collection Time: 07/30/24 12:38 AM   Specimen: BLOOD RIGHT ARM  Result Value Ref Range Status   Specimen Description BLOOD RIGHT ARM  Final   Special Requests   Final    BOTTLES DRAWN AEROBIC AND ANAEROBIC Blood Culture results may not be optimal due to an inadequate volume of blood received in culture bottles   Culture   Final    NO GROWTH 5 DAYS Performed at [facility], [address] [place], [place] [address]    Report Status 08/04/2024 FINAL  Final  Blood culture (routine x 2)     Status: None   Collection Time: 07/30/24 12:42 AM   Specimen: BLOOD LEFT ARM  Result Value Ref Range Status   Specimen Description BLOOD LEFT ARM  Final   Special Requests   Final    BOTTLES DRAWN AEROBIC AND ANAEROBIC Blood Culture results may not be optimal due to an inadequate volume of blood received in culture bottles   Culture   Final    NO GROWTH 5 DAYS Performed at [facility], [address] [place], [place] [address]    Report Status 08/04/2024 FINAL  Final  Resp panel by RT-PCR (RSV, Flu A&B, Covid) Anterior Nasal Swab     Status: None   Collection Time: 07/30/24  1:19 AM   Specimen: Anterior Nasal Swab  Result Value Ref Range Status   SARS Coronavirus 2 by RT PCR NEGATIVE NEGATIVE Final   Influenza A by PCR NEGATIVE NEGATIVE Final   Influenza B by PCR NEGATIVE NEGATIVE Final    Comment: (NOTE) The Xpert Xpress SARS-CoV-2/FLU/RSV plus assay is intended as an aid in the diagnosis of influenza from Nasopharyngeal swab specimens and should not be used as a sole basis for treatment. Nasal washings and aspirates are unacceptable for Xpert Xpress SARS-CoV-2/FLU/RSV testing.  Fact Sheet for Patients: bloggercourse.com  Fact Sheet for Healthcare Providers: seriousbroker.it  This test is not yet approved or cleared by the [place]  FDA and has been authorized for detection and/or diagnosis of SARS-CoV-2 by FDA under an Emergency Use Authorization (EUA). This EUA will remain in effect (meaning this test can be used) for the duration of  the COVID-19 declaration under Section 564(b)(1) of the Act, 21 U.S.C. section 360bbb-3(b)(1), unless the authorization is terminated or revoked.     Resp Syncytial Virus by PCR NEGATIVE NEGATIVE Final    Comment: (NOTE) Fact Sheet for  Patients: bloggercourse.com  Fact Sheet for Healthcare Providers: seriousbroker.it  This test is not yet approved or cleared by the [place]  FDA and has been authorized for detection and/or diagnosis of SARS-CoV-2 by FDA under an Emergency Use Authorization (EUA). This EUA will remain in effect (meaning this test can be used) for the duration of the COVID-19 declaration under Section 564(b)(1) of the Act, 21 U.S.C. section 360bbb-3(b)(1), unless the authorization is terminated or revoked.  Performed at [facility], [address] [place], [place] [address]   Respiratory (~20 pathogens) panel by PCR     Status: Abnormal   Collection Time: 07/30/24 12:43 PM   Specimen: Nasopharyngeal Swab; Respiratory  Result Value Ref Range Status   Adenovirus NOT DETECTED NOT DETECTED Final   Coronavirus 229E NOT DETECTED NOT DETECTED Final    Comment: (NOTE) The Coronavirus on the Respiratory Panel, DOES NOT test for the novel  Coronavirus (2019 nCoV)    Coronavirus HKU1 NOT DETECTED NOT DETECTED Final   Coronavirus NL63 NOT DETECTED NOT DETECTED Final   Coronavirus OC43 NOT DETECTED NOT DETECTED Final   Metapneumovirus NOT DETECTED NOT DETECTED Final   Rhinovirus / Enterovirus NOT DETECTED NOT DETECTED Final   Influenza A NOT DETECTED NOT DETECTED Final   Influenza B NOT DETECTED NOT DETECTED Final   Parainfluenza Virus 1 DETECTED (A) NOT DETECTED Final   Parainfluenza Virus 2 NOT DETECTED NOT DETECTED Final   Parainfluenza Virus 3 NOT DETECTED NOT DETECTED Final   Parainfluenza Virus 4 NOT DETECTED NOT DETECTED Final   Respiratory Syncytial Virus NOT DETECTED NOT DETECTED Final   Bordetella pertussis NOT DETECTED NOT DETECTED Final   Bordetella Parapertussis NOT DETECTED NOT DETECTED Final   Chlamydophila pneumoniae NOT DETECTED NOT DETECTED Final   Mycoplasma pneumoniae NOT DETECTED NOT DETECTED Final    Comment: Performed at  [facility], [address] [place], [place] [address]     Radiology Studies: DG CHEST PORT 1 VIEW Result Date: 08/04/2024 EXAM: 1 VIEW XRAY OF THE CHEST 08/04/2024 01:01:00 AM COMPARISON: 07/29/2024 CLINICAL HISTORY: Acute respiratory failure with hypoxia (HCC) FINDINGS: LUNGS AND PLEURA: No focal pulmonary opacity. No pleural effusion. No pneumothorax. Elevated right hemidiaphragm with underlying loop of colon. HEART AND MEDIASTINUM: Aortic arch atherosclerosis. No acute abnormality of the cardiac and mediastinal silhouettes. BONES AND SOFT TISSUES: Surgical clips in right upper quadrant.  No acute osseous abnormality. IMPRESSION: 1. No acute cardiopulmonary abnormality. Electronically signed by: Pinkie Pebbles MD MD 08/04/2024 01:04 AM EST RP Workstation: [id number]     Scheduled Meds:  amLODipine   10 mg Oral QHS   aspirin   81 mg Oral Daily   feeding supplement (GLUCERNA SHAKE)  237 mL Oral TID BM   memantine   10 mg Oral BID   multivitamin with minerals  1 tablet Oral Daily   polyethylene glycol  17 g Oral Daily   senna-docusate  1 tablet Oral BID   thiamine   100 mg Oral Daily   Continuous Infusions:  sodium chloride  75 mL/hr at 08/04/24 0959   cefTRIAXone  (ROCEPHIN )  IV       LOS: 5 days   Ivonne Mustache, MD [facility] P1/05/2025, 11:37 AM  "

## 2024-08-04 NOTE — Plan of Care (Signed)

## 2024-08-05 DIAGNOSIS — I214 Non-ST elevation (NSTEMI) myocardial infarction: Secondary | ICD-10-CM | POA: Diagnosis not present

## 2024-08-05 LAB — BASIC METABOLIC PANEL WITH GFR
Anion gap: 13 (ref 5–15)
BUN: 38 mg/dL — ABNORMAL HIGH (ref 8–23)
CO2: 22 mmol/L (ref 22–32)
Calcium: 9.2 mg/dL (ref 8.9–10.3)
Chloride: 117 mmol/L — ABNORMAL HIGH (ref 98–111)
Creatinine, Ser: 0.49 mg/dL (ref 0.44–1.00)
GFR, Estimated: >60 mL/min
Glucose, Bld: 117 mg/dL — ABNORMAL HIGH (ref 70–99)
Potassium: 3.4 mmol/L — ABNORMAL LOW (ref 3.5–5.1)
Sodium: 151 mmol/L — ABNORMAL HIGH (ref 135–145)

## 2024-08-05 LAB — CBC
HCT: 33.4 % — ABNORMAL LOW (ref 36.0–46.0)
Hemoglobin: 10.7 g/dL — ABNORMAL LOW (ref 12.0–15.0)
MCH: 29.3 pg (ref 26.0–34.0)
MCHC: 32 g/dL (ref 30.0–36.0)
MCV: 91.5 fL (ref 80.0–100.0)
Platelets: 148 K/uL — ABNORMAL LOW (ref 150–400)
RBC: 3.65 MIL/uL — ABNORMAL LOW (ref 3.87–5.11)
RDW: 15.3 % (ref 11.5–15.5)
WBC: 11.1 K/uL — ABNORMAL HIGH (ref 4.0–10.5)
nRBC: 0 % (ref 0.0–0.2)

## 2024-08-05 LAB — MAGNESIUM: Magnesium: 2.1 mg/dL (ref 1.7–2.4)

## 2024-08-05 LAB — PHOSPHORUS: Phosphorus: 2.3 mg/dL — ABNORMAL LOW (ref 2.5–4.6)

## 2024-08-05 MED ORDER — K PHOS MONO-SOD PHOS DI & MONO 155-852-130 MG PO TABS
500.0000 mg | ORAL_TABLET | Freq: Three times a day (TID) | ORAL | Status: DC
  Filled 2024-08-05 (×4): qty 2

## 2024-08-05 MED ORDER — POTASSIUM CHLORIDE 10 MEQ/100ML IV SOLN
10.0000 meq | Freq: Once | INTRAVENOUS | Status: AC
  Administered 2024-08-05: 10 meq via INTRAVENOUS
  Filled 2024-08-05: qty 100

## 2024-08-05 MED ORDER — DEXTROSE 5 % IV SOLN: INTRAVENOUS | Status: AC

## 2024-08-05 MED ORDER — POTASSIUM CHLORIDE 10 MEQ/100ML IV SOLN
10.0000 meq | INTRAVENOUS | Status: AC
  Administered 2024-08-05 (×2): 10 meq via INTRAVENOUS
  Filled 2024-08-05: qty 100

## 2024-08-05 NOTE — Progress Notes (Signed)
 " PROGRESS NOTE  Sherri Gross  FMW:[id number] DOB: [birth year] DOA: 07/29/2024 PCP: Hilma Philis HERO   Brief Narrative: Patient is a 79 year old female with history of dementia, hearing loss, hyperlipidemia, hypertension, diabetes type 2 who presented with shortness of breath, loss of consciousness.  She was brought from nursing facility.  Found to have altered mentation and found to have new oxygen requirement.  On presentation, she was hypertensive, tachypneic, hypoxic requiring nonrebreather.  Labs with WBC count 15.2, elevated troponin, elevated lactate level.  Respiratory viral panel showed parainfluenza virus 1.  CT chest showed cardiomegaly with central venous congestion, mild interstitial edema bilaterally, patchy ground glass upper lobe opacities consistent with edema/pneumonitis.  Patient started on antibiotics for community-acquired pneumonia.  Cardiology consulted.  Started on heparin  drip for NSTEMI. NSTEMI is less likely, echo showed normal EF.  Heparin  drip discontinued.  Patient has improved clinically.  She was waiting to go to back to her SNF. Now has very poor oral intake, developed hyponatremia.  Palliative care consulted today for goals of care.  Nutritionist/speech therapy consulted  Assessment & Plan:  Principal Problem:   NSTEMI (non-ST elevated myocardial infarction) (HCC) Active Problems:   Malnutrition of moderate degree   NSTEMI: Presented with elevated troponin.  Cardiology were following.  Was on heparin  drip.  Patient is nonverbal.  Does not seem that she has active chest pain at present.  Looks comfortable this morning.  Echo showed EF of 70-75%, no wall motion abnormality, grade 1 diastolic function.  Heparin  drip discontinued.  Cardiology signed off  Acute hypoxic respiratory failure: Likely from pneumonia/parainfluenza virus infection.  She was weaned to room air but now back to 2 L of oxygen.  Chest x-ray at follow-up done on 1/11 did not show any acute  findings  Community-acquired pneumonia/elevated lactate/leukocytosis: Chest x-ray consistent with right upper lobe pneumonitis/aspiration.  Speech therapy consulted and following.  On dysphagia 2 diet. Follow-up cultures: NGTD.  Continue bronchodilators as needed.   She finished antibiotics course but had to be restarted again because she developed new onset mild grade fever, leukocytosis on 1/11.  Chest x-ray did not show any pneumonia.  Will check UA.  Continue ceftriaxone  for now.  Hypertension: Takes amlodipine  2.5 mg daily.    Amlodipine  restarted at 10 mg daily.  Blood pressure stable  Hypokalemia/hyponatremia: Supplemented with potassium .  Continue D5 for hypernatremia  Debility/deconditioning/dementia/hearing loss/poor oral intake: Patient lives at nursing facility.  Consulted PT/OT.  Continue memantine .  She is nonambulatory.  She has contractures of bilateral upper extremities.  Having very poor oral intake, not eating food even when fed.  Long discussion held with Darin Crosby on phone on 1/12.  She says that patient is DNR.  CODE STATUS changed.  We discussed about goals of care.  She is okay with palliative care consultation.  She is receptive about IV fluids and tube feeding if needed.   Nutrition Problem: Moderate Malnutrition Etiology: social / environmental circumstances    DVT prophylaxis:SCDs Start: 07/30/24 0758     Code Status: Limited: Do not attempt resuscitation (DNR) -DNR-LIMITED -Do Not Intubate/DNI   Family Communication: Called and discussed with sister [name] on phone on 1/12  Patient status:Inpatient  Patient is from :SNF  Anticipated discharge to:SNF  Estimated DC date : 2 to 3 days   Consultants: Cardiology  Procedures:None  Antimicrobials:  Anti-infectives (From admission, onward)    Start     Dose/Rate Route Frequency Ordered Stop   08/04/24 1330  cefTRIAXone  (ROCEPHIN )  2 g in sodium chloride  0.9 % 100 mL IVPB        2 g 200 mL/hr over  30 Minutes Intravenous Every 24 hours 08/04/24 0916     08/04/24 0900  cefTRIAXone  (ROCEPHIN ) 2 g in sodium chloride  0.9 % 100 mL IVPB  Status:  Discontinued        2 g 200 mL/hr over 30 Minutes Intravenous Every 24 hours 08/04/24 0812 08/04/24 0812   08/02/24 1000  azithromycin  (ZITHROMAX ) tablet 500 mg        500 mg Oral Daily 08/01/24 1308 08/02/24 1059   07/31/24 1345  cefTRIAXone  (ROCEPHIN ) 2 g in sodium chloride  0.9 % 100 mL IVPB        2 g 200 mL/hr over 30 Minutes Intravenous Every 24 hours 07/31/24 1256 08/03/24 2135   07/31/24 1200  azithromycin  (ZITHROMAX ) tablet 500 mg        500 mg Oral Daily 07/31/24 0850 08/01/24 0914   07/30/24 1300  cefTRIAXone  (ROCEPHIN ) 1 g in sodium chloride  0.9 % 100 mL IVPB  Status:  Discontinued        1 g 200 mL/hr over 30 Minutes Intravenous Every 24 hours 07/30/24 1247 07/31/24 1256   07/30/24 1300  azithromycin  (ZITHROMAX ) 500 mg in sodium chloride  0.9 % 250 mL IVPB  Status:  Discontinued        500 mg 250 mL/hr over 60 Minutes Intravenous Every 24 hours 07/30/24 1247 07/31/24 0850   07/30/24 0015  ceFEPIme  (MAXIPIME ) 2 g in sodium chloride  0.9 % 100 mL IVPB        2 g 200 mL/hr over 30 Minutes Intravenous  Once 07/30/24 0011 07/30/24 0215   07/30/24 0015  vancomycin  (VANCOREADY) IVPB 1500 mg/300 mL        1,500 mg 150 mL/hr over 120 Minutes Intravenous  Once 07/30/24 0011 07/30/24 0437       Subjective: Patient seen and examined at bedside today.  She appears more comfortable than yesterday.  Not tachycardic like yesterday.  Respiratory status stable.  Does not appear to be in any kind of distress this morning.  She has been noticed to have poor oral intake.  Objective: Vitals:   08/05/24 0344 08/05/24 0403 08/05/24 0439 08/05/24 0831  BP: (!) 149/73   (!) 146/81  Pulse: 95  95 100  Resp: (!) 28 20 (!) 22 (!) 29  Temp: 99.3 F (37.4 C)   100.1 F (37.8 C)  TempSrc: Axillary   Axillary  SpO2: 95%  96% 95%  Weight:      Height:         Intake/Output Summary (Last 24 hours) at 08/05/2024 1129 Last data filed at 08/05/2024 0410 Gross per 24 hour  Intake 1500.68 ml  Output --  Net 1500.68 ml   Filed Weights   07/29/24 2257  Weight: 65.8 kg    Examination:   General exam: Overall comfortable, not in distress, appears comfortable today, chronically deconditioned HEENT: PERRL Respiratory system:  no wheezes or crackles  Cardiovascular system: S1 & S2 heard, RRR.  Gastrointestinal system: Abdomen is nondistended, soft and nontender. Central nervous system: Awake  but not alert or oriented Extremities: No edema, no clubbing ,no cyanosis, contractures of bilateral upper extremities Skin: No rashes, no ulcers,no icterus     Data Reviewed: I have personally reviewed following labs and imaging studies  CBC: Recent Labs  Lab 07/29/24 2259 07/29/24 2323 07/30/24 0408 07/31/24 0443 08/01/24 0532 08/04/24 0136 08/05/24 0429  WBC  15.2*  --   --  6.2 5.6 16.9* 11.1*  NEUTROABS 13.0*  --   --   --   --   --   --   HGB 13.3   < > 13.3 13.1 12.3 12.2 10.7*  HCT 42.6   < > 39.0 39.0 36.8 36.2 33.4*  MCV 95.1  --   --  89.0 88.9 88.3 91.5  PLT 224  --   --  211 197 155 148*   < > = values in this interval not displayed.   Basic Metabolic Panel: Recent Labs  Lab 07/31/24 0443 07/31/24 1413 08/01/24 0532 08/02/24 0513 08/04/24 0136 08/05/24 0429  NA 139  --  141 141 145 151*  K 3.0*  --  3.4* 3.6 3.4* 3.4*  CL 103  --  106 106 108 117*  CO2 21*  --  24 23 21* 22  GLUCOSE 81  --  116* 121* 159* 117*  BUN 13  --  18 21 35* 38*  CREATININE 0.52  --  0.61 0.53 0.64 0.49  CALCIUM 8.9  --  9.0 9.0 9.7 9.2  MG  --  1.6*  --   --   --  2.1  PHOS  --   --   --   --   --  2.3*     Recent Results (from the past 240 hours)  Blood culture (routine x 2)     Status: None   Collection Time: 07/30/24 12:38 AM   Specimen: BLOOD RIGHT ARM  Result Value Ref Range Status   Specimen Description BLOOD RIGHT ARM  Final    Special Requests   Final    BOTTLES DRAWN AEROBIC AND ANAEROBIC Blood Culture results may not be optimal due to an inadequate volume of blood received in culture bottles   Culture   Final    NO GROWTH 5 DAYS Performed at [facility], [address] [place], [place] [address]    Report Status 08/04/2024 FINAL  Final  Blood culture (routine x 2)     Status: None   Collection Time: 07/30/24 12:42 AM   Specimen: BLOOD LEFT ARM  Result Value Ref Range Status   Specimen Description BLOOD LEFT ARM  Final   Special Requests   Final    BOTTLES DRAWN AEROBIC AND ANAEROBIC Blood Culture results may not be optimal due to an inadequate volume of blood received in culture bottles   Culture   Final    NO GROWTH 5 DAYS Performed at [facility], [address] [place], [place] [address]    Report Status 08/04/2024 FINAL  Final  Resp panel by RT-PCR (RSV, Flu A&B, Covid) Anterior Nasal Swab     Status: None   Collection Time: 07/30/24  1:19 AM   Specimen: Anterior Nasal Swab  Result Value Ref Range Status   SARS Coronavirus 2 by RT PCR NEGATIVE NEGATIVE Final   Influenza A by PCR NEGATIVE NEGATIVE Final   Influenza B by PCR NEGATIVE NEGATIVE Final    Comment: (NOTE) The Xpert Xpress SARS-CoV-2/FLU/RSV plus assay is intended as an aid in the diagnosis of influenza from Nasopharyngeal swab specimens and should not be used as a sole basis for treatment. Nasal washings and aspirates are unacceptable for Xpert Xpress SARS-CoV-2/FLU/RSV testing.  Fact Sheet for Patients: bloggercourse.com  Fact Sheet for Healthcare Providers: seriousbroker.it  This test is not yet approved or cleared by the [place]  FDA and has been  authorized for detection and/or diagnosis of SARS-CoV-2 by FDA under an Emergency Use Authorization (EUA). This EUA will remain in effect (meaning this test can be used) for the duration of the COVID-19  declaration under Section 564(b)(1) of the Act, 21 U.S.C. section 360bbb-3(b)(1), unless the authorization is terminated or revoked.     Resp Syncytial Virus by PCR NEGATIVE NEGATIVE Final    Comment: (NOTE) Fact Sheet for Patients: bloggercourse.com  Fact Sheet for Healthcare Providers: seriousbroker.it  This test is not yet approved or cleared by the [place]  FDA and has been authorized for detection and/or diagnosis of SARS-CoV-2 by FDA under an Emergency Use Authorization (EUA). This EUA will remain in effect (meaning this test can be used) for the duration of the COVID-19 declaration under Section 564(b)(1) of the Act, 21 U.S.C. section 360bbb-3(b)(1), unless the authorization is terminated or revoked.  Performed at [facility] Lab, [address] [place], [place] [address]   Respiratory (~20 pathogens) panel by PCR     Status: Abnormal   Collection Time: 07/30/24 12:43 PM   Specimen: Nasopharyngeal Swab; Respiratory  Result Value Ref Range Status   Adenovirus NOT DETECTED NOT DETECTED Final   Coronavirus 229E NOT DETECTED NOT DETECTED Final    Comment: (NOTE) The Coronavirus on the Respiratory Panel, DOES NOT test for the novel  Coronavirus (2019 nCoV)    Coronavirus HKU1 NOT DETECTED NOT DETECTED Final   Coronavirus NL63 NOT DETECTED NOT DETECTED Final   Coronavirus OC43 NOT DETECTED NOT DETECTED Final   Metapneumovirus NOT DETECTED NOT DETECTED Final   Rhinovirus / Enterovirus NOT DETECTED NOT DETECTED Final   Influenza A NOT DETECTED NOT DETECTED Final   Influenza B NOT DETECTED NOT DETECTED Final   Parainfluenza Virus 1 DETECTED (A) NOT DETECTED Final   Parainfluenza Virus 2 NOT DETECTED NOT DETECTED Final   Parainfluenza Virus 3 NOT DETECTED NOT DETECTED Final   Parainfluenza Virus 4 NOT DETECTED NOT DETECTED Final   Respiratory Syncytial Virus NOT DETECTED NOT DETECTED Final   Bordetella pertussis NOT  DETECTED NOT DETECTED Final   Bordetella Parapertussis NOT DETECTED NOT DETECTED Final   Chlamydophila pneumoniae NOT DETECTED NOT DETECTED Final   Mycoplasma pneumoniae NOT DETECTED NOT DETECTED Final    Comment: Performed at [facility], [address] [place], [place] [address]  Culture, blood (Routine X 2) w Reflex to ID Panel     Status: None (Preliminary result)   Collection Time: 08/04/24  9:33 AM   Specimen: BLOOD LEFT ARM  Result Value Ref Range Status   Specimen Description BLOOD LEFT ARM  Final   Special Requests   Final    BOTTLES DRAWN AEROBIC AND ANAEROBIC Blood Culture results may not be optimal due to an inadequate volume of blood received in culture bottles   Culture   Final    NO GROWTH < 24 HOURS Performed at [facility], [address] [place], [place] [address]    Report Status PENDING  Incomplete  Culture, blood (Routine X 2) w Reflex to ID Panel     Status: None (Preliminary result)   Collection Time: 08/04/24  9:40 AM   Specimen: BLOOD LEFT ARM  Result Value Ref Range Status   Specimen Description BLOOD LEFT ARM  Final   Special Requests   Final    BOTTLES DRAWN AEROBIC AND ANAEROBIC Blood Culture results may not be optimal due to an inadequate volume of blood received in culture bottles  Culture   Final    NO GROWTH < 24 HOURS Performed at [facility], [address], [place], [place] [address]    Report Status PENDING  Incomplete     Radiology Studies: DG CHEST PORT 1 VIEW Result Date: 08/04/2024 EXAM: 1 VIEW XRAY OF THE CHEST 08/04/2024 01:01:00 AM COMPARISON: 07/29/2024 CLINICAL HISTORY: Acute respiratory failure with hypoxia (HCC) FINDINGS: LUNGS AND PLEURA: No focal pulmonary opacity. No pleural effusion. No pneumothorax. Elevated right hemidiaphragm with underlying loop of colon. HEART AND MEDIASTINUM: Aortic arch atherosclerosis. No acute abnormality of the cardiac and mediastinal silhouettes. BONES AND SOFT TISSUES: Surgical  clips in right upper quadrant.  No acute osseous abnormality. IMPRESSION: 1. No acute cardiopulmonary abnormality. Electronically signed by: Pinkie Pebbles MD MD 08/04/2024 01:04 AM EST RP Workstation: [id number]     Scheduled Meds:  amLODipine   10 mg Oral QHS   aspirin   81 mg Oral Daily   feeding supplement (GLUCERNA SHAKE)  237 mL Oral TID BM   memantine   10 mg Oral BID   multivitamin with minerals  1 tablet Oral Daily   phosphorus  500 mg Oral TID   polyethylene glycol  17 g Oral Daily   senna-docusate  1 tablet Oral BID   thiamine   100 mg Oral Daily   Continuous Infusions:  cefTRIAXone  (ROCEPHIN )  IV Stopped (08/04/24 1542)   dextrose  75 mL/hr at 08/05/24 1034   potassium chloride  10 mEq (08/05/24 1040)     LOS: 6 days   Ivonne Mustache, MD [facility] P1/06/2025, 11:29 AM  "

## 2024-08-05 NOTE — Care Management Important Message (Signed)
 Important Message  Patient Details  Name: SYLVA OVERLEY MRN: [id number] Date of Birth: Aug 23, 1945   Important Message Given:  Yes - Medicare IM     Vonzell Arrie Sharps 08/05/2024, 11:21 AM

## 2024-08-05 NOTE — Progress Notes (Addendum)
 Nutrition Follow-up / Consult  DOCUMENTATION CODES:  Non-severe (moderate) malnutrition in context of social or environmental circumstances  INTERVENTION:  SLP evaluation to determine safest diet, MD ordered. Continue Magic cup with meals, each supplement provides 290 kcal and 9 grams of protein. Continue to offer Glucerna Shake po TID if patient is appropriate for POs, each supplement provides 220 kcal and 10 grams of protein. If patient unable to safely take POs, will need Cortrak tube, service is available Mondays, Wednesdays, and Fridays.   NUTRITION DIAGNOSIS:  Moderate Malnutrition related to social / environmental circumstances as evidenced by moderate fat depletion, severe muscle depletion; ongoing.  GOAL:  Patient will meet greater than or equal to 90% of their needs; unmet.  MONITOR:  PO intake, Supplement acceptance, Labs, Weight trends  REASON FOR ASSESSMENT:  Consult Poor PO  ASSESSMENT:  Patient presented with loss of consciousness from nursing home and was admitted for NSTEMI and PNA. PMH significant for dementia/nonverbal at baseline, intellectual disability, spastic hemiparesis, hearing loss, DM2, HTN, and dyslipidemia.  Patient remains on a dysphagia 2 diet with thin liquids; intake of meals is minimal. She had a few bites of magic cup yesterday with her meds per RN. Nursing is assisting with meals. Patient not following commands this morning so RN did not feed her. She is not drinking the Glucerna shakes. RN reports patient has basically had nothing to eat for 2 days. MD has ordered SLP evaluation.   Palliative care team has been consulted by MD for goals of care discussion. Code status changed to DNR after MD discussion with patient's sister.   Admit weight: 65.8 kg  Average Meal Intake: 1/9-1/10: 0-2% intake x 4 recorded meals  Nutritionally Relevant Medications: Scheduled Meds:  amLODipine   10 mg Oral QHS   aspirin   81 mg Oral Daily   feeding supplement  (GLUCERNA SHAKE)  237 mL Oral TID BM   memantine   10 mg Oral BID   multivitamin with minerals  1 tablet Oral Daily   phosphorus  500 mg Oral TID   polyethylene glycol  17 g Oral Daily   senna-docusate  1 tablet Oral BID   thiamine   100 mg Oral Daily   Continuous Infusions:  cefTRIAXone  (ROCEPHIN )  IV Stopped (08/04/24 1542)   dextrose  75 mL/hr at 08/05/24 1034   potassium chloride  10 mEq (08/05/24 1040)   PRN Meds:.acetaminophen , hydrALAZINE , ipratropium-albuterol , morphine  injection, ondansetron  (ZOFRAN ) IV, oxyCODONE   Labs Reviewed: Na 151 K 3.4<--3.4<--3.6<--3.4<--3<--3.6<--3.4<--3.7 Phos 2.3 Mag 2.1<--1.6  Diet Order:   Diet Order             DIET DYS 2 Room service appropriate? No; Fluid consistency: Thin  Diet effective now                   EDUCATION NEEDS:  Not appropriate for education at this time  Skin:  Skin Assessment: Reviewed RN Assessment (blister on back)  Last BM:  1/11  Height:  Ht Readings from Last 1 Encounters:  07/29/24 5' 5 (1.651 m)   Weight:  Wt Readings from Last 1 Encounters:  07/29/24 65.8 kg   Ideal Body Weight:  57 kg  BMI:  Body mass index is 24.13 kg/m.  Estimated Nutritional Needs:  Kcal:  1500-1700 Protein:  70-85 Fluid:  >/=1500   Suzen HUNT RD, LDN, CNSC Contact via secure chat. If unavailable, use group chat RD Inpatient.

## 2024-08-05 NOTE — Progress Notes (Signed)
 Dr.Adhikari notified that pt.is not eating. Pt's only PO intake has been bites of magic cup to take pills, which takes a lot of effort and monitoring to ensure she swallows safely.

## 2024-08-05 NOTE — Progress Notes (Signed)
" °   08/04/24 1944  Assess: MEWS Score  Temp 99.5 F (37.5 C)  BP (!) 148/85  MAP (mmHg) 105  Pulse Rate (!) 106  ECG Heart Rate (!) 107  Resp (!) 23  SpO2 90 %  O2 Device Nasal Cannula  Assess: MEWS Score  MEWS Temp 0  MEWS Systolic 0  MEWS Pulse 1  MEWS RR 1  MEWS LOC 0  MEWS Score 2  MEWS Score Color Yellow  Assess: if the MEWS score is Yellow or Red  Were vital signs accurate and taken at a resting state? Yes  Does the patient meet 2 or more of the SIRS criteria? Yes  Does the patient have a confirmed or suspected source of infection? Yes  MEWS guidelines implemented  No, previously yellow, continue vital signs every 4 hours (previously red & Yellow)  Assess: SIRS CRITERIA  SIRS Temperature  0  SIRS Respirations  1  SIRS Pulse 1  SIRS WBC 0  SIRS Score Sum  2    "

## 2024-08-05 NOTE — TOC Progression Note (Addendum)
 Transition of Care [facility]) - Progression Note    Patient Details  Name: Sherri Gross MRN: [id number] Date of Birth: 1946-04-10  Transition of Care [facility]) CM/SW Contact  Isaiah Public, LCSWA Phone Number: 08/05/2024, 12:41 PM  Clinical Narrative:     CSW LVM with [facility] RN. CSW awaiting call back.  Update- CSW spoke with [name] [facility] RN who confirmed patient can return when medically ready. CSW informed MD. MD informed CSW patient not medically ready for dc today. CSW to follow up with [facility] RN when medically stable for dc.Tele# for [name] [phone]. All questions answered. No further questions reported at this time.  Expected Discharge Plan: Group Home Barriers to Discharge: Continued Medical Work up               Expected Discharge Plan and Services In-house Referral: Clinical Social Work     Living arrangements for the past 2 months: Group Home                                       Social Drivers of Health (SDOH) Interventions SDOH Screenings   Food Insecurity: No Food Insecurity (07/30/2024)  Housing: Low Risk (07/30/2024)  Transportation Needs: No Transportation Needs (07/30/2024)  Utilities: Not At Risk (07/30/2024)  Social Connections: Socially Isolated (07/30/2024)  Tobacco Use: Low Risk (07/30/2024)    Readmission Risk Interventions     No data to display

## 2024-08-06 ENCOUNTER — Inpatient Hospital Stay (HOSPITAL_COMMUNITY)

## 2024-08-06 DIAGNOSIS — I214 Non-ST elevation (NSTEMI) myocardial infarction: Secondary | ICD-10-CM | POA: Diagnosis not present

## 2024-08-06 DIAGNOSIS — Z515 Encounter for palliative care: Secondary | ICD-10-CM

## 2024-08-06 DIAGNOSIS — Z7189 Other specified counseling: Secondary | ICD-10-CM

## 2024-08-06 DIAGNOSIS — R0902 Hypoxemia: Secondary | ICD-10-CM

## 2024-08-06 LAB — PHOSPHORUS: Phosphorus: 3.6 mg/dL (ref 2.5–4.6)

## 2024-08-06 LAB — BASIC METABOLIC PANEL WITH GFR
Anion gap: 13 (ref 5–15)
BUN: 41 mg/dL — ABNORMAL HIGH (ref 8–23)
CO2: 22 mmol/L (ref 22–32)
Calcium: 9.4 mg/dL (ref 8.9–10.3)
Chloride: 114 mmol/L — ABNORMAL HIGH (ref 98–111)
Creatinine, Ser: 0.65 mg/dL (ref 0.44–1.00)
GFR, Estimated: >60 mL/min
Glucose, Bld: 181 mg/dL — ABNORMAL HIGH (ref 70–99)
Potassium: 3.8 mmol/L (ref 3.5–5.1)
Sodium: 149 mmol/L — ABNORMAL HIGH (ref 135–145)

## 2024-08-06 LAB — BLOOD GAS, VENOUS
Acid-Base Excess: 2 mmol/L (ref 0.0–2.0)
Bicarbonate: 27.3 mmol/L (ref 20.0–28.0)
Drawn by: 8050
O2 Saturation: 88.2 %
Patient temperature: 37.4
pCO2, Ven: 45 mmHg (ref 44–60)
pH, Ven: 7.39 (ref 7.25–7.43)
pO2, Ven: 49 mmHg — ABNORMAL HIGH (ref 32–45)

## 2024-08-06 LAB — CBC
HCT: 35.1 % — ABNORMAL LOW (ref 36.0–46.0)
Hemoglobin: 11.1 g/dL — ABNORMAL LOW (ref 12.0–15.0)
MCH: 29.3 pg (ref 26.0–34.0)
MCHC: 31.6 g/dL (ref 30.0–36.0)
MCV: 92.6 fL (ref 80.0–100.0)
Platelets: 193 K/uL (ref 150–400)
RBC: 3.79 MIL/uL — ABNORMAL LOW (ref 3.87–5.11)
RDW: 15.9 % — ABNORMAL HIGH (ref 11.5–15.5)
WBC: 12.8 K/uL — ABNORMAL HIGH (ref 4.0–10.5)
nRBC: 0.2 % (ref 0.0–0.2)

## 2024-08-06 MED ORDER — DIPHENHYDRAMINE HCL 50 MG/ML IJ SOLN: 12.5000 mg | INTRAMUSCULAR | Status: DC | PRN

## 2024-08-06 MED ORDER — LORAZEPAM 2 MG/ML PO CONC: 1.0000 mg | ORAL | Status: DC | PRN

## 2024-08-06 MED ORDER — LORAZEPAM 2 MG/ML IJ SOLN: 1.0000 mg | INTRAMUSCULAR | Status: DC | PRN

## 2024-08-06 MED ORDER — HALOPERIDOL LACTATE 5 MG/ML IJ SOLN: 0.5000 mg | INTRAMUSCULAR | Status: DC | PRN

## 2024-08-06 MED ORDER — SODIUM CHLORIDE 0.9 % IV SOLN: 3.0000 g | Freq: Four times a day (QID) | INTRAVENOUS | Status: DC

## 2024-08-06 MED ORDER — GLYCOPYRROLATE 0.2 MG/ML IJ SOLN: 0.2000 mg | INTRAMUSCULAR | Status: DC | PRN

## 2024-08-06 MED ORDER — MORPHINE SULFATE (PF) 2 MG/ML IV SOLN
2.0000 mg | INTRAVENOUS | Status: DC | PRN
  Administered 2024-08-06 – 2024-08-07 (×2): 2 mg via INTRAVENOUS
  Filled 2024-08-06 (×2): qty 1

## 2024-08-06 MED ORDER — HALOPERIDOL LACTATE 2 MG/ML PO CONC: 0.5000 mg | ORAL | Status: DC | PRN

## 2024-08-06 MED ORDER — BIOTENE DRY MOUTH MT LIQD: 15.0000 mL | OROMUCOSAL | Status: DC | PRN

## 2024-08-06 MED ORDER — POLYVINYL ALCOHOL 1.4 % OP SOLN: 1.0000 [drp] | Freq: Four times a day (QID) | OPHTHALMIC | Status: DC | PRN

## 2024-08-06 MED ORDER — MORPHINE SULFATE (PF) 2 MG/ML IV SOLN: 2.0000 mg | INTRAVENOUS | Status: DC | PRN

## 2024-08-06 MED ORDER — BISACODYL 10 MG RE SUPP: 10.0000 mg | Freq: Every day | RECTAL | Status: DC | PRN

## 2024-08-06 NOTE — Progress Notes (Signed)
 Physical Therapy Treatment and Discharge Patient Details Name: Sherri Gross MRN: [id number] DOB: 11-Dec-1945 Today's Date: 08/06/2024   History of Present Illness Pt is a 79 y.o. F presenting to [facility] on 07/29/24 after being found unconscious in bed. IMG shows aortic atherosclerosis and atherosclerotic CAD. Pt admitted with MI. PMH is significant for dementia, intellectual disability, diabetes, HLD, and L spastic hemiparesis.    PT Comments  Pt received in bed on Bipap. RN transferring pt over to 6L O2 via [place]. Performed ROM to BLE's and B hands without pt participating. Assisted RN with rolling for repositioning as well as placing new dressings on bony prominences. Pt not participating in any of these activities or following any commands. SPO2 decreased from 100% on Bipap to 93% on 6L, RN monitoring. Pt no longer appropriate for acute PT (PA placed discontinue order after this session in accordance with this assessment), PT signing off.     If plan is discharge home, recommend the following: Two people to help with walking and/or transfers;Two people to help with bathing/dressing/bathroom;Direct supervision/assist for medications management;Assist for transportation   Can travel by private vehicle        Equipment Recommendations  None recommended by PT    Recommendations for Other Services       Precautions / Restrictions Precautions Precautions: Fall Recall of Precautions/Restrictions: Intact Precaution/Restrictions Comments: MI restrictions Restrictions Weight Bearing Restrictions Per Provider Order: No     Mobility  Bed Mobility Overal bed mobility: Needs Assistance Bed Mobility: Rolling Rolling: Total assist, +2 for physical assistance         General bed mobility comments: Pt not participating with rolling for repositioning and replacing mepilex dressings with RN    Transfers                   General transfer comment: n/a    Ambulation/Gait                General Gait Details: n/a   Stairs             Wheelchair Mobility     Tilt Bed    Modified Rankin (Stroke Patients Only)       Balance                                            Communication Communication Communication: Impaired Factors Affecting Communication: Other (comment) (pt is non-verbal)  Cognition Arousal: Lethargic Behavior During Therapy: Flat affect   PT - Cognitive impairments: Sequencing, Initiation, Problem solving, Awareness, Orientation, Memory, Attention, Safety/Judgement                       PT - Cognition Comments: pt not engaging in session in any way other than grimacing with ROM to LE's Following commands: Impaired Following commands impaired:  (does not follow any commands)    Cueing Cueing Techniques: Verbal cues, Gestural cues  Exercises Other Exercises Other Exercises: ROM to B hips, knees, and ankles. ROM to B wrists and fingers with wascloth placed in L hand to prevent nails in palm, already one present in R    General Comments General comments (skin integrity, edema, etc.): pt moved from Bipap to 6L O2 via [place], respiratory present at switch. SPO2 dropped from 100% to 93% over 10 mins.      Pertinent Vitals/Pain Pain Assessment Pain Assessment:  PAINAD Breathing: normal Negative Vocalization: occasional moan/groan, low speech, negative/disapproving quality Facial Expression: sad, frightened, frown Body Language: relaxed Consolability: no need to console PAINAD Score: 2 Pain Intervention(s): Limited activity within patient's tolerance    Home Living                          Prior Function            PT Goals (current goals can now be found in the care plan section) Acute Rehab PT Goals Patient Stated Goal: unable to state at this time PT Goal Formulation: With patient/family Time For Goal Achievement: 08/14/24 Potential to Achieve Goals: Fair Progress towards PT goals: Not  progressing toward goals - comment (PT signing off)    Frequency           PT Plan      Co-evaluation              AM-PAC PT 6 Clicks Mobility   Outcome Measure  Help needed turning from your back to your side while in a flat bed without using bedrails?: Total Help needed moving from lying on your back to sitting on the side of a flat bed without using bedrails?: Total Help needed moving to and from a bed to a chair (including a wheelchair)?: Total Help needed standing up from a chair using your arms (e.g., wheelchair or bedside chair)?: Total Help needed to walk in hospital room?: Total Help needed climbing 3-5 steps with a railing? : Total 6 Click Score: 6    End of Session Equipment Utilized During Treatment: Oxygen (6L) Activity Tolerance: Other (comment);Patient limited by lethargy (impaired cognition) Patient left: in bed;with call bell/phone within reach;with bed alarm set Nurse Communication: Mobility status PT Visit Diagnosis: Muscle weakness (generalized) (M62.81);Other symptoms and signs involving the nervous system (R29.898)     Time: 9081-9060 PT Time Calculation (min) (ACUTE ONLY): 21 min  Charges:    $Therapeutic Activity: 8-22 mins PT General Charges $$ ACUTE PT VISIT: 1 Visit                     Richerd Lipoma, PT  Acute Rehab Services Secure chat preferred Office [phone]    Richerd CROME Rizwan Kuyper 08/06/2024, 12:38 PM

## 2024-08-06 NOTE — Consult Note (Signed)
 "                              Consultation Note Date: 08/06/2024   Patient Name: Sherri Gross  DOB: March 04, 1946  MRN: [id number]  Age / Sex: 79 y.o., female  PCP: Hilma Philis HERO Referring Physician: Jillian Buttery, MD  Reason for Consultation: Establishing goals of care  HPI/Patient Profile: 79 y.o. female  with past medical history of advanced dementia, hearing loss, hypertension, hyperlipidemia, DM2, remote psychosis admitted on 07/29/2024 with hypoxia from SNF.   Patient was found to have viral infection/pneumonia in the ED.  She was hypotensive and had leukocytosis, lactic acidosis which improved, elevated troponin which increased from 365-578.  She had CT of the chest that showed mild interstitial edema, upper lobe opacities concerning with edema and/or pneumonitis.  She was admitted with acute hypoxic respiratory failure and completed antibiotics.  NSTEMI was ultimately ruled out.  Unfortunately, she had acute worsening of her clinical status today with respiratory distress and increased oxygen requirements, suspect aspiration.  She was started back on antibiotics.  PMT has been consulted to assist with goals of care conversation.  Clinical Assessment and Goals of Care:  I have reviewed medical records including EPIC notes, labs and imaging, discussed with primary attending and SLP, assessed the patient and then had a phone conversation with patient's sister/legal guardian [name] to discuss diagnosis prognosis, GOC, EOL wishes, disposition and options.  I introduced Palliative Medicine as specialized medical care for people living with serious illness. It focuses on providing relief from the symptoms and stress of a serious illness. The goal is to improve quality of life for both the patient and the family.  We discussed a brief life review of the patient and then focused on their current illness.   I attempted to elicit values and goals of care important to the patient.    Medical  History Review and Understanding:  We discussed patient's acute illness in the context of their chronic comorbidities. The natural disease trajectory and expectations at EOL were discussed, with emphasis on the irreversible nature of dementia and signs/symptoms of disease progression. Patient's sister understands the severity of patient's illness.  Social History: Patient's only living sibling remaining is [name].  She never had any children.  Her sister clarifies that she is the only legal guardian after discussing that patient's niece Grayce was listed in medical record as legal guardian as well.  Patient previously enjoyed writing and coloring, attending the outings at her facility though unable to participate with activity such as swimming.  She is no longer able to enjoy any of her previous hobbies.  Functional and Nutritional State: Patient is wheelchair-bound, has been for about 2 years now.  Albumin of 3.6 on 10/02/2023.  Worsening oral intake.  Palliative Symptoms: Dyspnea, fever  Advance Directives: No documentation currently on file.  Code Status: Concepts specific to code status, artifical feeding and hydration, and rehospitalization were considered and discussed.   Discussion: Patient's sister shares her concern about patient's overall quality of life, citing her contractures and no source of joy from daily activities.  She shared her hope was for IV fluids and tube feedings to see if this will perk her up.  I provided extensive counseling on the risks and benefits of artificial nutrition and hydration, sharing my concern that this would cause greater harm than benefit and no impact to patient's overall prognosis.  We discussed evidence-based  medicine regarding artificial nutrition and hydration in patients with dementia and rationale for this not being recommended.  Emotional support and therapeutic listening was provided as she processed all of the information provided.  She shared  that she did not expect the artificial hydration and nutrition to help, but she was not quite ready to consider alternatives yet.  She initially shared her interest in a short-term trial of Cortrak.  I called her back a second time after speaking with SLP and provided update on today's evaluation.  We discussed unlikelihood that she would benefit or improve and she ultimately decided for comfort focused care rather than the cortrak tomorrow. Hospice services were recommended, with review of services available in different settings.  She lives in [place] and would prefer the closest facility there so that she can visit.  She believes there is one close by.  The referral process was reviewed and she is aware to expect a call from social work next.  The difference between aggressive medical intervention and comfort care was considered in light of the patient's goals of care. Hospice and Palliative Care services outpatient were explained and offered.   Discussed the importance of continued conversation with family and the medical providers regarding overall plan of care and treatment options, ensuring decisions are within the context of the patients values and GOCs.   Questions and concerns were addressed.  Hard Choices booklet left for review. The family was encouraged to call with questions or concerns.  PMT will continue to support holistically.   SUMMARY OF RECOMMENDATIONS   - Continue DNR/DNI - Transition to comfort focused care today, discontinuing all medications and interventions, labs, monitoring etc. not aimed at providing comfort Morphine  PRN for pain/air hunger/comfort Robinul  PRN for excessive secretions Ativan  PRN for agitation/anxiety Zofran  PRN for nausea Liquifilm tears PRN for dry eyes Haldol  PRN for agitation/anxiety May have comfort feeding/pured diet if interested Comfort cart for family Unrestricted visitations in the setting of EOL (per policy) Oxygen PRN 2L or less for  comfort. No escalation.   - Psychosocial and emotional support provided - TOC consulted for assistance with hospice facility referral near [facility] - PMT will continue to follow and support   Prognosis:  < 2 weeks  Discharge Planning: Hospice facility      Primary Diagnoses: Present on Admission: **None**  Physical Exam Vitals and nursing note reviewed.  Constitutional:      Appearance: She is ill-appearing.     Interventions: Nasal cannula in place.  HENT:     Head: Normocephalic and atraumatic.  Cardiovascular:     Rate and Rhythm: Normal rate.  Pulmonary:     Effort: Tachypnea present.  Neurological:     Mental Status: She is disoriented and confused.  Psychiatric:        Cognition and Memory: Cognition is impaired.    Vital Signs: BP (!) 148/94 (BP Location: Left Arm)   Pulse 92   Temp 99.4 F (37.4 C) (Axillary)   Resp (!) 27   Ht 5' 5 (1.651 m)   Wt 65.8 kg   SpO2 100%   BMI 24.13 kg/m  Pain Scale: PAINAD      SpO2: SpO2: 100 % O2 Device:SpO2: 100 % O2 Flow Rate: .O2 Flow Rate (L/min): 15 L/min   Palliative Assessment/Data: 10%     Lajarvis Italiano P Munira Polson, PA-C  Palliative Medicine Team Team phone # [phone]  Thank you for allowing the Palliative Medicine Team to assist in the care of this patient.  Please utilize secure chat with additional questions, if there is no response within 30 minutes please call the above phone number.  Palliative Medicine Team providers are available by phone from 7am to 7pm daily and can be reached through the team cell phone.  Should this patient require assistance outside of these hours, please call the patient's attending physician.    Billing based on MDM: High  Problems Addressed: One acute or chronic illness or injury that poses a threat to life or bodily function  Amount and/or Complexity of Data: Category 1:Review of prior external note(s) from each unique source, Review of the result(s) of each unique  test, and Assessment requiring an independent historian(s), Category 2:Independent interpretation of a test performed by another physician/other qualified health care professional (not separately reported), and Category 3:Discussion of management or test interpretation with external physician/other qualified health care professional/appropriate source (not separately reported)  Risks: Parenteral controlled substances and Decision not to resuscitate or to de-escalate care because of poor prognosis   "

## 2024-08-06 NOTE — TOC Progression Note (Addendum)
 Transition of Care [facility]) - Progression Note    Patient Details  Name: Sherri Gross MRN: [id number] Date of Birth: 1946-05-30  Transition of Care [facility]) CM/SW Contact  Isaiah Public, LCSWA Phone Number: 08/06/2024, 2:33 PM  Clinical Narrative:     CSW received consult from Palliative NP for residential hospice for patient. CSW spoke with patients legal guardian/sister [name]. [name] gave CSW permission to make referral to [facility] for [facility] CSW called [facility] and spoke with Delon and made referral. Delon informed CSW that liaison will follow up with CSW. CSW will continue to follow.  Update- CSW spoke with [facility] Liaison who request for CSW to fax referral over for review. Fax # [phone]. CSW faxed referral over for review.  Expected Discharge Plan: Group Home Barriers to Discharge: Continued Medical Work up               Expected Discharge Plan and Services In-house Referral: Clinical Social Work     Living arrangements for the past 2 months: Group Home                                       Social Drivers of Health (SDOH) Interventions SDOH Screenings   Food Insecurity: No Food Insecurity (07/30/2024)  Housing: Low Risk (07/30/2024)  Transportation Needs: No Transportation Needs (07/30/2024)  Utilities: Not At Risk (07/30/2024)  Social Connections: Socially Isolated (07/30/2024)  Tobacco Use: Low Risk (07/30/2024)    Readmission Risk Interventions     No data to display

## 2024-08-06 NOTE — Progress Notes (Signed)
 " PROGRESS NOTE  Sherri Gross  FMW:[id number] DOB: 02-17-1946 DOA: 07/29/2024 PCP: Hilma Philis HERO   Brief Narrative: Patient is a 79 year old female with history of dementia, hearing loss, hyperlipidemia, hypertension, diabetes type 2 who presented with shortness of breath, loss of consciousness.  She was brought from nursing facility.  Found to have altered mentation and found to have new oxygen requirement.  On presentation, she was hypertensive, tachypneic, hypoxic requiring nonrebreather.  Labs with WBC count 15.2, elevated troponin, elevated lactate level.  Respiratory viral panel showed parainfluenza virus 1.  CT chest showed cardiomegaly with central venous congestion, mild interstitial edema bilaterally, patchy ground glass upper lobe opacities consistent with edema/pneumonitis.  Patient started on antibiotics for community-acquired pneumonia.  Cardiology consulted.  Started on heparin  drip for NSTEMI. NSTEMI is less likely, echo showed normal EF.  Heparin  drip discontinued.  Patient has improved clinically.  She was waiting to go to back to her SNF. Now has very poor oral intake, developed hypernatremia.  PNutritionist/speech therapy consulted.  Also developed respiratory distress since early morning of 1/13.  Likely she aspirated.  Antibiotics changed to Unasyn.alliative care consulted  for goals of care.    Assessment & Plan:  Principal Problem:   NSTEMI (non-ST elevated myocardial infarction) (HCC) Active Problems:   Malnutrition of moderate degree    Acute hypoxic respiratory failure/Aspiration pneumonia: Came with  pneumonia/parainfluenza virus infection.  Admission chest x-ray consistent with right upper lobe pneumonitis/aspiration.  Speech therapy consulted and following.  She was on dysphagia 2 diet.  She was weaned to room air but now back to 5 L of oxygen.  Chest x-ray at follow-up done on 1/11 did not show any acute findings.  Chest x-ray on 11/13 showed  chronic elevation of  right hemidiaphragm with basilar atelectasis and retrocardiac opacity within the medial base of the left lung, likely due to atelectasis. She likely aspirated again.  Speech therapy recommending NPO.  Continue gentle IV fluids.  Started on Unasyn. Palliative care consulted for goals of care.  Has mild leukocytosis, mostly febrile.  NSTEMI: Presented with elevated troponin.  Cardiology were following.  Was on heparin  drip.  Patient is nonverbal.  Does not seem that she has active chest pain at present.  Looks comfortable this morning.  Echo showed EF of 70-75%, no wall motion abnormality, grade 1 diastolic function.  Heparin  drip discontinued.  Cardiology signed off  Hypertension: Takes amlodipine  2.5 mg daily.    Amlodipine  restarted at 10 mg daily.  Blood pressure stable  Hypernatremia: Supplemented with potassium .  Continue D5 for hypernatremia  Debility/deconditioning/dementia/hearing loss/poor oral intake: Patient lives at nursing facility.  Consulted PT/OT.  Continue memantine .  She is nonambulatory.  She has contractures of bilateral upper extremities.  Having very poor oral intake, not eating food even when fed.  Now has aspiration pneumonia.  Long discussion held with Darin Crosby on phone on 1/12.  She says that patient is DNR.  CODE STATUS changed.  We discussed about goals of care.  She is okay with palliative care consultation.  She is receptive about IV fluids and tube feeding if needed. I think comfort care is the most appropriate decision for her   Nutrition Problem: Moderate Malnutrition Etiology: social / environmental circumstances    DVT prophylaxis:SCDs Start: 07/30/24 0758     Code Status: Limited: Do not attempt resuscitation (DNR) -DNR-LIMITED -Do Not Intubate/DNI   Family Communication: Called and discussed with sister [name] on phone on 1/12  Patient status:Inpatient  Patient is from :SNF  Anticipated discharge to:not sure  Estimated DC date :not  sure   Consultants: Cardiology, palliative care  Procedures:None  Antimicrobials:  Anti-infectives (From admission, onward)    Start     Dose/Rate Route Frequency Ordered Stop   08/06/24 1045  Ampicillin-Sulbactam (UNASYN) 3 g in sodium chloride  0.9 % 100 mL IVPB        3 g 200 mL/hr over 30 Minutes Intravenous Every 6 hours 08/06/24 0958     08/04/24 1330  cefTRIAXone  (ROCEPHIN ) 2 g in sodium chloride  0.9 % 100 mL IVPB  Status:  Discontinued        2 g 200 mL/hr over 30 Minutes Intravenous Every 24 hours 08/04/24 0916 08/06/24 0945   08/04/24 0900  cefTRIAXone  (ROCEPHIN ) 2 g in sodium chloride  0.9 % 100 mL IVPB  Status:  Discontinued        2 g 200 mL/hr over 30 Minutes Intravenous Every 24 hours 08/04/24 0812 08/04/24 0812   08/02/24 1000  azithromycin  (ZITHROMAX ) tablet 500 mg        500 mg Oral Daily 08/01/24 1308 08/02/24 1059   07/31/24 1345  cefTRIAXone  (ROCEPHIN ) 2 g in sodium chloride  0.9 % 100 mL IVPB        2 g 200 mL/hr over 30 Minutes Intravenous Every 24 hours 07/31/24 1256 08/03/24 2135   07/31/24 1200  azithromycin  (ZITHROMAX ) tablet 500 mg        500 mg Oral Daily 07/31/24 0850 08/01/24 0914   07/30/24 1300  cefTRIAXone  (ROCEPHIN ) 1 g in sodium chloride  0.9 % 100 mL IVPB  Status:  Discontinued        1 g 200 mL/hr over 30 Minutes Intravenous Every 24 hours 07/30/24 1247 07/31/24 1256   07/30/24 1300  azithromycin  (ZITHROMAX ) 500 mg in sodium chloride  0.9 % 250 mL IVPB  Status:  Discontinued        500 mg 250 mL/hr over 60 Minutes Intravenous Every 24 hours 07/30/24 1247 07/31/24 0850   07/30/24 0015  ceFEPIme  (MAXIPIME ) 2 g in sodium chloride  0.9 % 100 mL IVPB        2 g 200 mL/hr over 30 Minutes Intravenous  Once 07/30/24 0011 07/30/24 0215   07/30/24 0015  vancomycin  (VANCOREADY) IVPB 1500 mg/300 mL        1,500 mg 150 mL/hr over 120 Minutes Intravenous  Once 07/30/24 0011 07/30/24 0437       Subjective: Patient seen and examined at bedside today.  She  went into respiratory distress last night.  Required up to 5 to 6 L of oxygen and even BiPAP.  Most likely she aspirated.  This morning she was lying on bed, appears mildly tachypneic.  Maintaining saturation on 5 L of oxygen.  Afebrile.  Objective: Vitals:   08/06/24 0315 08/06/24 0327 08/06/24 0547 08/06/24 0820  BP:  125/68 127/68 (!) 148/94  Pulse: (!) 106 (!) 104 97 92  Resp: (!) 31 (!) 25 (!) 32 (!) 27  Temp:  99.3 F (37.4 C)  99.4 F (37.4 C)  TempSrc:  Axillary  Axillary  SpO2: 96% 98% 98% 100%  Weight:      Height:        Intake/Output Summary (Last 24 hours) at 08/06/2024 1138 Last data filed at 08/06/2024 0820 Gross per 24 hour  Intake 131.37 ml  Output --  Net 131.37 ml   Filed Weights   07/29/24 2257  Weight: 65.8 kg    Examination:  General  exam: Deconditioned, chronically ill looking, lying in bed HEENT: PERRL Respiratory system: Decreased air sound bilaterally Cardiovascular system: S1 & S2 heard, RRR.  Gastrointestinal system: Abdomen is nondistended, soft and nontender. Central nervous system: Awake but not alert or oriented Extremities: No edema, no clubbing ,no cyanosis, contractures of bilateral upper extremities Skin: No rashes, no ulcers,no icterus     Data Reviewed: I have personally reviewed following labs and imaging studies  CBC: Recent Labs  Lab 07/31/24 0443 08/01/24 0532 08/04/24 0136 08/05/24 0429 08/06/24 0448  WBC 6.2 5.6 16.9* 11.1* 12.8*  HGB 13.1 12.3 12.2 10.7* 11.1*  HCT 39.0 36.8 36.2 33.4* 35.1*  MCV 89.0 88.9 88.3 91.5 92.6  PLT 211 197 155 148* 193   Basic Metabolic Panel: Recent Labs  Lab 07/31/24 1413 08/01/24 0532 08/02/24 0513 08/04/24 0136 08/05/24 0429 08/06/24 0448  NA  --  141 141 145 151* 149*  K  --  3.4* 3.6 3.4* 3.4* 3.8  CL  --  106 106 108 117* 114*  CO2  --  24 23 21* 22 22  GLUCOSE  --  116* 121* 159* 117* 181*  BUN  --  18 21 35* 38* 41*  CREATININE  --  0.61 0.53 0.64 0.49 0.65  CALCIUM   --  9.0 9.0 9.7 9.2 9.4  MG 1.6*  --   --   --  2.1  --   PHOS  --   --   --   --  2.3* 3.6     Recent Results (from the past 240 hours)  Blood culture (routine x 2)     Status: None   Collection Time: 07/30/24 12:38 AM   Specimen: BLOOD RIGHT ARM  Result Value Ref Range Status   Specimen Description BLOOD RIGHT ARM  Final   Special Requests   Final    BOTTLES DRAWN AEROBIC AND ANAEROBIC Blood Culture results may not be optimal due to an inadequate volume of blood received in culture bottles   Culture   Final    NO GROWTH 5 DAYS Performed at [facility], [address] [place], [place] [address]    Report Status 08/04/2024 FINAL  Final  Blood culture (routine x 2)     Status: None   Collection Time: 07/30/24 12:42 AM   Specimen: BLOOD LEFT ARM  Result Value Ref Range Status   Specimen Description BLOOD LEFT ARM  Final   Special Requests   Final    BOTTLES DRAWN AEROBIC AND ANAEROBIC Blood Culture results may not be optimal due to an inadequate volume of blood received in culture bottles   Culture   Final    NO GROWTH 5 DAYS Performed at [facility], [address] [place], [place] [address]    Report Status 08/04/2024 FINAL  Final  Resp panel by RT-PCR (RSV, Flu A&B, Covid) Anterior Nasal Swab     Status: None   Collection Time: 07/30/24  1:19 AM   Specimen: Anterior Nasal Swab  Result Value Ref Range Status   SARS Coronavirus 2 by RT PCR NEGATIVE NEGATIVE Final   Influenza A by PCR NEGATIVE NEGATIVE Final   Influenza B by PCR NEGATIVE NEGATIVE Final    Comment: (NOTE) The Xpert Xpress SARS-CoV-2/FLU/RSV plus assay is intended as an aid in the diagnosis of influenza from Nasopharyngeal swab specimens and should not be used as a sole basis for treatment. Nasal washings and aspirates are unacceptable for Xpert Xpress SARS-CoV-2/FLU/RSV testing.  Fact Sheet for Patients: bloggercourse.com  Fact Sheet for Healthcare  Providers: seriousbroker.it  This test is not yet approved or cleared by the [place]  FDA and has been authorized for detection and/or diagnosis of SARS-CoV-2 by FDA under an Emergency Use Authorization (EUA). This EUA will remain in effect (meaning this test can be used) for the duration of the COVID-19 declaration under Section 564(b)(1) of the Act, 21 U.S.C. section 360bbb-3(b)(1), unless the authorization is terminated or revoked.     Resp Syncytial Virus by PCR NEGATIVE NEGATIVE Final    Comment: (NOTE) Fact Sheet for Patients: bloggercourse.com  Fact Sheet for Healthcare Providers: seriousbroker.it  This test is not yet approved or cleared by the [place]  FDA and has been authorized for detection and/or diagnosis of SARS-CoV-2 by FDA under an Emergency Use Authorization (EUA). This EUA will remain in effect (meaning this test can be used) for the duration of the COVID-19 declaration under Section 564(b)(1) of the Act, 21 U.S.C. section 360bbb-3(b)(1), unless the authorization is terminated or revoked.  Performed at [facility], [address], [place], [place] [address]   Respiratory (~20 pathogens) panel by PCR     Status: Abnormal   Collection Time: 07/30/24 12:43 PM   Specimen: Nasopharyngeal Swab; Respiratory  Result Value Ref Range Status   Adenovirus NOT DETECTED NOT DETECTED Final   Coronavirus 229E NOT DETECTED NOT DETECTED Final    Comment: (NOTE) The Coronavirus on the Respiratory Panel, DOES NOT test for the novel  Coronavirus (2019 nCoV)    Coronavirus HKU1 NOT DETECTED NOT DETECTED Final   Coronavirus NL63 NOT DETECTED NOT DETECTED Final   Coronavirus OC43 NOT DETECTED NOT DETECTED Final   Metapneumovirus NOT DETECTED NOT DETECTED Final   Rhinovirus / Enterovirus NOT DETECTED NOT DETECTED Final   Influenza A NOT DETECTED NOT DETECTED Final   Influenza B NOT  DETECTED NOT DETECTED Final   Parainfluenza Virus 1 DETECTED (A) NOT DETECTED Final   Parainfluenza Virus 2 NOT DETECTED NOT DETECTED Final   Parainfluenza Virus 3 NOT DETECTED NOT DETECTED Final   Parainfluenza Virus 4 NOT DETECTED NOT DETECTED Final   Respiratory Syncytial Virus NOT DETECTED NOT DETECTED Final   Bordetella pertussis NOT DETECTED NOT DETECTED Final   Bordetella Parapertussis NOT DETECTED NOT DETECTED Final   Chlamydophila pneumoniae NOT DETECTED NOT DETECTED Final   Mycoplasma pneumoniae NOT DETECTED NOT DETECTED Final    Comment: Performed at [facility], [address] [place], [place] [address]  Culture, blood (Routine X 2) w Reflex to ID Panel     Status: None (Preliminary result)   Collection Time: 08/04/24  9:33 AM   Specimen: BLOOD LEFT ARM  Result Value Ref Range Status   Specimen Description BLOOD LEFT ARM  Final   Special Requests   Final    BOTTLES DRAWN AEROBIC AND ANAEROBIC Blood Culture results may not be optimal due to an inadequate volume of blood received in culture bottles   Culture   Final    NO GROWTH 2 DAYS Performed at [facility], [address], [place], [place] [address]    Report Status PENDING  Incomplete  Culture, blood (Routine X 2) w Reflex to ID Panel     Status: None (Preliminary result)   Collection Time: 08/04/24  9:40 AM   Specimen: BLOOD LEFT ARM  Result Value Ref Range Status   Specimen Description BLOOD LEFT ARM  Final   Special Requests  Final    BOTTLES DRAWN AEROBIC AND ANAEROBIC Blood Culture results may not be optimal due to an inadequate volume of blood received in culture bottles   Culture   Final    NO GROWTH 2 DAYS Performed at [facility], [address] [place], [place] [address]    Report Status PENDING  Incomplete     Radiology Studies: DG CHEST PORT 1 VIEW Result Date: 08/06/2024 EXAM: 1 VIEW(S) XRAY OF THE CHEST 08/06/2024 06:19:00 AM COMPARISON: 08/04/2024 CLINICAL HISTORY: The  patient has acute respiratory failure with hypoxia. ICD10: 427266 - Acute respiratory failure with hypoxia (HCC). FINDINGS: LUNGS AND PLEURA: Chronic elevation of right hemidiaphragm with basilar atelectasis. Retrocardiac opacity within the medial base of the left lung is likely due to atelectasis. No pleural effusion. No pneumothorax. HEART AND MEDIASTINUM: Atherosclerotic aorta. No acute abnormality of the cardiac and mediastinal silhouettes. BONES AND SOFT TISSUES: Cholecystectomy clips noted. Degenerative changes of spine and shoulders. Multiple loose bodies identified within the axillary recess of the left shoulder joint. No acute osseous abnormality. IMPRESSION: 1. Chronic elevation of right hemidiaphragm with basilar atelectasis and retrocardiac opacity within the medial base of the left lung, likely due to atelectasis. Electronically signed by: Waddell Calk MD MD 08/06/2024 06:42 AM EST RP Workstation: [id number]     Scheduled Meds:  amLODipine   10 mg Oral QHS   aspirin   81 mg Oral Daily   feeding supplement (GLUCERNA SHAKE)  237 mL Oral TID BM   memantine   10 mg Oral BID   multivitamin with minerals  1 tablet Oral Daily   phosphorus  500 mg Oral TID   polyethylene glycol  17 g Oral Daily   senna-docusate  1 tablet Oral BID   thiamine   100 mg Oral Daily   Continuous Infusions:  ampicillin-sulbactam (UNASYN) IV       LOS: 7 days   Ivonne Mustache, MD [facility] P1/13/2026, 11:38 AM  "

## 2024-08-06 NOTE — Evaluation (Signed)
 Clinical/Bedside Swallow Evaluation Patient Details  Name: Sherri Gross MRN: [id number] Date of Birth: 1945-11-06  Today's Date: 08/06/2024 Time: SLP Start Time (ACUTE ONLY): 9056 SLP Stop Time (ACUTE ONLY): 0949 SLP Time Calculation (min) (ACUTE ONLY): 6 min  Past Medical History:  Past Medical History:  Diagnosis Date   Arthritis    Cataracts, bilateral    Cerebellar ataxia (HCC)    Dementia (HCC)    Hearing loss    Hypercholesteremia    Hypertension    IDDM (insulin dependent diabetes mellitus)    Osteoarthritis    Presbyopia    Psychotic disorder (HCC)    Past Surgical History:  Past Surgical History:  Procedure Laterality Date   denies surgical history     HPI:  Sherri Gross is a 79 yo female presenting to ED 1/6 with AMS and new supplemental O2 requirements in the setting to elevated troponin. CT Chest shows resolution of hazy patchy bilateral ground-glass opacification suggesting resolved interstitial edema with stable mild cardiomegaly. Clinical evaluation 1/6 with recs to resume baseline diet (D2/minced & moist with thin liquids).  Pt with subsequent decline in medical status, decreased PO intake, and SLP re-consulted. PMH includes dementia, hearing loss, HTN, HLD, T2DM    Assessment / Plan / Recommendation  Clinical Impression  Pt presents with clinical indicators of pharyngeal dysphagia.  Pt's overall medical status has declined since evaluation on 07/31/23.  [name] required BiPAP overnight but was able to transition to nasal cannula prior to this assessment.  Pt with eyes open, but minimally responsive.  Unable to fully visualize oral cavity, but appears somewhat dry.  Pt did not accept ice chips.  With a very small amount of water (around 1/8 tsp) placed in oral cavity by spoon there was no appreciable oral transit or pharyngeal swallow response observed.  There was delayed, weak throat clear after bolus presentation.  Pt is at high risk of aspiration, but is unable to  particpate in further evaluation at this time. She is unlikely to be able to meet nutritional needs orally. Pt is being followed by palliative care.  Recommend ongoing discussions around goals of care.  SLP will follow remotely to reasses as needed.    Recommend pt remain NPO at this time with alternate means of medication administration.   SLP Visit Diagnosis: Dysphagia, oropharyngeal phase (R13.12)    Aspiration Risk  Severe aspiration risk;Risk for inadequate nutrition/hydration    Diet Recommendation NPO    Medication Administration: Via alternative means    Other Recommendations Oral Care Recommendations: Oral care QID     Swallow Evaluation Recommendations  N/A   Assistance Recommended at Discharge  N/A  Functional Status Assessment Patient has had a recent decline in their functional status and demonstrates the ability to make significant improvements in function in a reasonable and predictable amount of time.  Frequency and Duration min 2x/week  2 weeks       Prognosis Prognosis for improved oropharyngeal function: Guarded Barriers to Reach Goals: Cognitive deficits      Swallow Study   General Date of Onset: 07/30/24 HPI: Sherri Gross is a 79 yo female presenting to ED 1/6 with AMS and new supplemental O2 requirements in the setting to elevated troponin. CT Chest shows resolution of hazy patchy bilateral ground-glass opacification suggesting resolved interstitial edema with stable mild cardiomegaly. Clinical evaluation 1/6 with recs to resume baseline diet (D2/minced & moist with thin liquids).  Pt with subsequent decline in medical status, decreased PO intake, and  SLP re-consulted. PMH includes dementia, hearing loss, HTN, HLD, T2DM Type of Study: Bedside Swallow Evaluation Previous Swallow Assessment: clinical assessment 1/6 Diet Prior to this Study: NPO Respiratory Status: Nasal cannula History of Recent Intubation: No Behavior/Cognition: Alert Oral Cavity  Assessment:  (unable to fully visual, appears somewhat dry) Oral Care Completed by SLP: No Oral Cavity - Dentition: Edentulous Self-Feeding Abilities: Total assist Patient Positioning: Upright in bed Baseline Vocal Quality: Not observed Volitional Cough: Cognitively unable to elicit Volitional Swallow: Unable to elicit    Oral/Motor/Sensory Function Overall Oral Motor/Sensory Function:  (unable to assess)   Ice Chips Ice chips: Impaired Oral Phase Impairments: Poor awareness of bolus   Thin Liquid Thin Liquid: Impaired Oral Phase Impairments: Poor awareness of bolus Pharyngeal  Phase Impairments: Throat Clearing - Delayed;Unable to trigger swallow    Nectar Thick Nectar Thick Liquid: Not tested   Honey Thick Honey Thick Liquid: Not tested   Puree Puree: Not tested   Solid     Solid: Not tested      Sherri FORBES Grippe, MA, CCC-SLP Acute Rehabilitation Services Office: [phone] 08/06/2024,10:05 AM

## 2024-08-06 NOTE — Progress Notes (Signed)
 Nutrition Brief Note  Chart reviewed. Pt now transitioning to comfort care. Plans for d/c to residential hospice. No further nutrition interventions planned at this time.  Please re-consult as needed.   Suzen HUNT RD, LDN, CNSC Contact via secure chat. If unavailable, use group chat RD Inpatient.

## 2024-08-06 NOTE — Plan of Care (Signed)
  Problem: Clinical Measurements: Goal: Cardiovascular complication will be avoided Outcome: Progressing   Problem: Pain Managment: Goal: General experience of comfort will improve and/or be controlled Outcome: Progressing   Problem: Safety: Goal: Ability to remain free from injury will improve Outcome: Progressing

## 2024-08-07 NOTE — Discharge Summary (Signed)
 Physician Discharge Summary  Sherri Gross FMW:[id number] DOB: July 13, 1946 DOA: 07/29/2024  PCP: Hilma Philis HERO  Admit date: 07/29/2024 Discharge date: 08/07/2024  Admitted From: Group Home Disposition: Residential hospice  Discharge Condition:Comfort care CODE STATUS:DNR Diet recommendation:Regular  Brief/Interim Summary: Patient is a 79 year old female with history of dementia, hearing loss, hyperlipidemia, hypertension, diabetes type 2 who presented with shortness of breath, loss of consciousness.  She was brought from nursing facility.  Found to have altered mentation and found to have new oxygen requirement.  On presentation, she was hypertensive, tachypneic, hypoxic requiring nonrebreather.  Labs with WBC count 15.2, elevated troponin, elevated lactate level.  Respiratory viral panel showed parainfluenza virus 1.  CT chest showed cardiomegaly with central venous congestion, mild interstitial edema bilaterally, patchy ground glass upper lobe opacities consistent with edema/pneumonitis.  Patient started on antibiotics for community-acquired pneumonia.  Cardiology consulted.  Started on heparin  drip for NSTEMI. NSTEMI is less likely, echo showed normal EF.  Heparin  drip discontinued.  Patient has improved clinically.  She was waiting to go to back to her SNF. She eventually developed poor oral intake, fever,hypernatremia.  PNutritionist/speech therapy consulted.  Also developed respiratory distress since early morning of 1/13.  Likely she aspirated.  Palliative care consulted for goals of care. Due to her significant comorbidities, goal of care discussed with family.  Family opted for comfort care.  Plan is to have transferred her to residential hospice today     Discharge Diagnoses:  Principal Problem:   NSTEMI (non-ST elevated myocardial infarction) [facility]) Active Problems:   Malnutrition of moderate degree    Discharge Instructions  Discharge Instructions     Discharge  instructions   Complete by: As directed    Follow up with Inpatient Hospice services   Increase activity slowly   Complete by: As directed    No wound care   Complete by: As directed       Allergies as of 08/07/2024       Reactions   Galantamine    Unknown per mar   Tuberculin Ppd    unknown        Medication List     STOP taking these medications    acetaminophen  325 MG tablet Commonly known as: TYLENOL    aluminum-magnesium  hydroxide 200-200 MG/5ML suspension   amLODipine  2.5 MG tablet Commonly known as: NORVASC    bisacodyl  10 MG suppository Commonly known as: DULCOLAX   diphenhydrAMINE  25 MG tablet Commonly known as: SOMINEX   docusate 50 MG/5ML liquid Commonly known as: COLACE   Ensure   ezetimibe 10 MG tablet Commonly known as: ZETIA   fenofibrate  145 MG tablet Commonly known as: TRICOR    guaiFENesin 100 MG/5ML liquid Commonly known as: ROBITUSSIN   hydrocortisone cream 1 %   loperamide 2 MG tablet Commonly known as: IMODIUM A-D   memantine  10 MG tablet Commonly known as: NAMENDA    Milk of Magnesia 1200 MG/15ML suspension Generic drug: magnesium  hydroxide   promethazine 25 MG tablet Commonly known as: PHENERGAN   sodium chloride  0.65 % Soln nasal spray Commonly known as: OCEAN   sodium phosphate Enem   tizanidine 2 MG capsule Commonly known as: ZANAFLEX   vitamin A & D ointment   Vitamin D3 50 MCG (2000 UT) Tabs        Allergies[1]  Consultations: Palliative care,cardiology   Procedures/Studies: DG CHEST PORT 1 VIEW Result Date: 08/06/2024 EXAM: 1 VIEW(S) XRAY OF THE CHEST 08/06/2024 06:19:00 AM COMPARISON: 08/04/2024 CLINICAL HISTORY: The patient has acute  respiratory failure with hypoxia. ICD10: 427266 - Acute respiratory failure with hypoxia (HCC). FINDINGS: LUNGS AND PLEURA: Chronic elevation of right hemidiaphragm with basilar atelectasis. Retrocardiac opacity within the medial base of the left lung is likely due to  atelectasis. No pleural effusion. No pneumothorax. HEART AND MEDIASTINUM: Atherosclerotic aorta. No acute abnormality of the cardiac and mediastinal silhouettes. BONES AND SOFT TISSUES: Cholecystectomy clips noted. Degenerative changes of spine and shoulders. Multiple loose bodies identified within the axillary recess of the left shoulder joint. No acute osseous abnormality. IMPRESSION: 1. Chronic elevation of right hemidiaphragm with basilar atelectasis and retrocardiac opacity within the medial base of the left lung, likely due to atelectasis. Electronically signed by: Waddell Calk MD MD 08/06/2024 06:42 AM EST RP Workstation: [id number]   DG CHEST PORT 1 VIEW Result Date: 08/04/2024 EXAM: 1 VIEW XRAY OF THE CHEST 08/04/2024 01:01:00 AM COMPARISON: 07/29/2024 CLINICAL HISTORY: Acute respiratory failure with hypoxia (HCC) FINDINGS: LUNGS AND PLEURA: No focal pulmonary opacity. No pleural effusion. No pneumothorax. Elevated right hemidiaphragm with underlying loop of colon. HEART AND MEDIASTINUM: Aortic arch atherosclerosis. No acute abnormality of the cardiac and mediastinal silhouettes. BONES AND SOFT TISSUES: Surgical clips in right upper quadrant.  No acute osseous abnormality. IMPRESSION: 1. No acute cardiopulmonary abnormality. Electronically signed by: Pinkie Pebbles MD MD 08/04/2024 01:04 AM EST RP Workstation: [id number]   ECHOCARDIOGRAM COMPLETE Result Date: 07/31/2024    ECHOCARDIOGRAM REPORT   Patient Name:   Sherri Gross Date of Exam: 07/31/2024 Medical Rec #:  [id number]       Height:       65.0 in Accession #:    [id number]      Weight:       145.0 lb Date of Birth:  August 04, 1945      BSA:          1.725 m Patient Age:    78 years        BP:           125/67 mmHg Patient Gender: F               HR:           102 bpm. Exam Location:  Inpatient Procedure: 2D Echo, Cardiac Doppler and Color Doppler (Both Spectral and Color            Flow Doppler were utilized during procedure). Indications:     NSTEMI I21.4  History:        Patient has no prior history of Echocardiogram examinations.                 Risk Factors:Hypertension.  Sonographer:    Tinnie Gosling RDCS Referring Phys: [id number] LOGAN N LOCKWOOD IMPRESSIONS  1. Left ventricular ejection fraction, by estimation, is 70 to 75%. The left ventricle has hyperdynamic function. The left ventricle has no regional wall motion abnormalities. There is mild concentric left ventricular hypertrophy. Left ventricular diastolic parameters are consistent with Grade I diastolic dysfunction (impaired relaxation).  2. Right ventricular systolic function is normal. The right ventricular size is normal.  3. The mitral valve is normal in structure. Trivial mitral valve regurgitation. No evidence of mitral stenosis.  4. The aortic valve is normal in structure. Aortic valve regurgitation is not visualized. No aortic stenosis is present. FINDINGS  Left Ventricle: Left ventricular ejection fraction, by estimation, is 70 to 75%. The left ventricle has hyperdynamic function. The left ventricle has no regional wall motion abnormalities. The left ventricular internal cavity size was normal  in size. There is mild concentric left ventricular hypertrophy. Left ventricular diastolic parameters are consistent with Grade I diastolic dysfunction (impaired relaxation). Right Ventricle: The right ventricular size is normal. No increase in right ventricular wall thickness. Right ventricular systolic function is normal. Left Atrium: Left atrial size was normal in size. Right Atrium: Right atrial size was normal in size. Pericardium: There is no evidence of pericardial effusion. Mitral Valve: The mitral valve is normal in structure. Trivial mitral valve regurgitation. No evidence of mitral valve stenosis. Tricuspid Valve: The tricuspid valve is normal in structure. Tricuspid valve regurgitation is trivial. No evidence of tricuspid stenosis. Aortic Valve: The aortic valve is normal in  structure. Aortic valve regurgitation is not visualized. No aortic stenosis is present. Aortic valve mean gradient measures 3.0 mmHg. Aortic valve peak gradient measures 4.9 mmHg. Pulmonic Valve: The pulmonic valve was normal in structure. Pulmonic valve regurgitation is mild to moderate. No evidence of pulmonic stenosis. Aorta: The aortic root is normal in size and structure. Venous: The inferior vena cava was not well visualized. IAS/Shunts: No atrial level shunt detected by color flow Doppler.  LEFT VENTRICLE PLAX 2D LVIDd:         4.60 cm     Diastology LVIDs:         2.70 cm     LV e' medial:    7.72 cm/s LV PW:         1.20 cm     LV E/e' medial:  7.7 LV IVS:        1.20 cm     LV e' lateral:   9.79 cm/s LVOT diam:     1.90 cm     LV E/e' lateral: 6.1 LVOT Area:     2.84 cm  LV Volumes (MOD) LV vol d, MOD A2C: 36.8 ml LV vol d, MOD A4C: 43.6 ml LV vol s, MOD A2C: 12.8 ml LV vol s, MOD A4C: 12.0 ml LV SV MOD A2C:     24.0 ml LV SV MOD A4C:     43.6 ml LV SV MOD BP:      27.9 ml LEFT ATRIUM             Index LA diam:        2.90 cm 1.68 cm/m LA Vol (A2C):   27.3 ml 15.82 ml/m LA Vol (A4C):   25.8 ml 14.95 ml/m LA Biplane Vol: 26.5 ml 15.36 ml/m  AORTIC VALVE AV Vmax:      111.00 cm/s AV Vmean:     83.000 cm/s AV VTI:       0.207 m AV Peak Grad: 4.9 mmHg AV Mean Grad: 3.0 mmHg  AORTA Ao Root diam: 3.30 cm Ao Asc diam:  3.20 cm MITRAL VALVE MV Area (PHT): 4.41 cm    SHUNTS MV Decel Time: 172 msec    Systemic Diam: 1.90 cm MV E velocity: 59.70 cm/s MV A velocity: 95.90 cm/s MV E/A ratio:  0.62 Toribio Fuel MD Electronically signed by Toribio Fuel MD Signature Date/Time: 07/31/2024/3:50:59 PM    Final    CT CHEST WO CONTRAST Result Date: 07/30/2024 CLINICAL DATA:  Respiratory distress. EXAM: CT CHEST WITHOUT CONTRAST TECHNIQUE: Multidetector CT imaging of the chest was performed following the standard protocol without IV contrast. RADIATION DOSE REDUCTION: This exam was performed according to the  departmental dose-optimization program which includes automated exposure control, adjustment of the mA and/or kV according to patient size and/or use of iterative reconstruction technique. COMPARISON:  07/30/2024 at 2:08 a.m. FINDINGS: Positioning is somewhat suboptimal due to patient's kyphosis and difficulty positioning prior to scanning. Cardiovascular: Mild stable cardiomegaly. Calcified plaque over the left main and 3 vessel coronary arteries. Thoracic aorta is normal caliber. There is calcified plaque throughout the thoracic aorta. Pulmonary arteries are unremarkable on this noncontrast exam. Mediastinum/Nodes: No mediastinal or hilar adenopathy. Remaining mediastinal structures are unremarkable. Lungs/Pleura: Moderate stable elevation of the right hemidiaphragm. Right apical pleural thickening unchanged. Atelectasis over the right lung base unchanged. No effusion. Near resolution of previously seen patchy hazy central lung attenuation suggesting resolved interstitial edema. Airways are unremarkable. Upper Abdomen: No acute findings.  Unchanged from recent prior exam. Musculoskeletal: Unchanged. IMPRESSION: 1. Interval resolution of hazy patchy bilateral ground-glass opacification suggesting resolved interstitial edema. 2. Stable mild cardiomegaly. Atherosclerotic coronary artery disease. 3. Aortic atherosclerosis. Aortic Atherosclerosis (ICD10-I70.0). Electronically Signed   By: Toribio Agreste M.D.   On: 07/30/2024 14:13   CT Angio Chest PE W and/or Wo Contrast Result Date: 07/30/2024 EXAM: CTA CHEST 07/30/2024 02:18:16 AM TECHNIQUE: CTA of the chest was performed with the administration of 75 mL of intravenous contrast (iohexol  (OMNIPAQUE ) 350 MG/ML injection). Multiplanar reformatted images are provided for review. MIP images are provided for review. Automated exposure control, iterative reconstruction, and/or weight based adjustment of the mA/kV was utilized to reduce the radiation dose to as low as  reasonably achievable. COMPARISON: Comparison is made with portable chest 07/29/2024, 06/21/2024, and 10/02/2023. No prior cross-sectional imaging for comparison. CLINICAL HISTORY: Pulmonary embolism (PE) suspected, high prob. Patient presents with hypoxia. FINDINGS: PULMONARY ARTERIES: The pulmonary arteries are normal caliber without evidence of thromboemboli. Main pulmonary artery is normal in caliber. MEDIASTINUM: There is mild panchamber cardiomegaly with a slight left chamber predominance with prominent central pulmonary veins. There is a tortuous thoracic aorta with mild patchy calcific plaques, scattered calcification in the great vessels without aneurysm, stenosis, or dissection. No pericardial effusion. LYMPH NODES: No mediastinal, hilar or axillary lymphadenopathy. LUNGS AND PLEURA: There are trace pleural effusions. No pneumothorax. There is mild interstitial edema centrally and in the upper lobes. The lungs are expiratory with a chronically elevated right hemidiaphragm. There are patchy ground-glass opacities in the upper lobes consistent with ground glass edema and/or pneumonitis. There is posterior atelectasis in the lower lobes, additional subsegmental atelectasis along the domes of the diaphragm. UPPER ABDOMEN: No acute abnormality is seen in the upper abdomen. The gallbladder is absent. SOFT TISSUES AND BONES: There is kyphosis and degenerative change of the thoracic spine, osteopenia. 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 No acute bone or soft tissue abnormality. IMPRESSION: 1. No evidence of pulmonary embolism. 2. Cardiomegaly with central venous distention, Mild interstitial edema centrally and in the upper lobes, with patchy ground-glass upper lobe opacities consistent with edema and/or pneumonitis. Findings compatible with chf or fluid overload, underlying pneumonitis  not excluded. . . 3. Trace pleural effusions. Electronically signed by: Francis Quam MD 07/30/2024 02:41 AM EST RP Workstation: [id number]   CT HEAD WO CONTRAST ( ) Result Date: 07/30/2024 EXAM: CT HEAD WITHOUT 07/30/2024 12:34:10 AM TECHNIQUE: CT of the head was performed without the administration of intravenous contrast. Automated exposure control, iterative reconstruction, and/or weight based adjustment of the mA/kV was utilized to reduce the radiation dose to as low as reasonably achievable. COMPARISON: 12/08/2020 CLINICAL HISTORY: Mental status change, unknown cause. FINDINGS: BRAIN AND VENTRICLES: No acute intracranial hemorrhage. No mass effect or  midline shift. No extra-axial fluid collection. Old right thalamic lacunar infarct. No hydrocephalus. ORBITS: There is bulging and thinning of the lateral wall of the right ethmoid air cells into the medial right orbit with mass effect on the medial rectus muscle. SINUSES AND MASTOIDS: Complete opacification of the right frontal sinus and ethmoid air cells. Mucosal thickening in the right maxillary sinus. There is bulging and thinning of the lateral wall of the right ethmoid air cells into the medial right orbit with mass effect on the medial rectus muscle. SOFT TISSUES AND SKULL: No acute skull fracture. No acute soft tissue abnormality. IMPRESSION: 1. No acute intracranial abnormality. 2. Complete opacification of the right frontal sinus and ethmoid air cells with mucosal thickening in the right maxillary sinus .findings compatible with chronic sinusitis . Bulging and thinning of the lateral wall of the right ethmoid air cells into the medial right orbit and mass effect on the medial rectus muscle. Electronically signed by: Franky Crease MD 07/30/2024 12:49 AM EST RP Workstation: [id number]   DG Chest Port 1 View Result Date: 07/29/2024 EXAM: 1 VIEW(S) XRAY OF THE CHEST 07/29/2024 11:48:00 PM COMPARISON: 06/21/2024 CLINICAL HISTORY: hypoxia FINDINGS: LUNGS  AND PLEURA: Left basilar opacity with silhouetting of the left hemidiaphragm. Elevated right hemidiaphragm with bowel gaseous distention under the right hemidiaphragm. No pneumothorax. HEART AND MEDIASTINUM: Atherosclerotic calcifications of the aortic arch. BONES AND SOFT TISSUES: Degenerative changes of the left shoulder. No acute osseous abnormality. IMPRESSION: 1. Left basilar atelectasis or infiltrate. 2. Elevated right hemidiaphragm with subjacent bowel gaseous distention. Electronically signed by: Greig Pique MD 07/29/2024 11:52 PM EST RP Workstation: [id number]      Subjective: Patient seen and examined at bedside today.  Currently on full comfort care.  Does not appear to be in acute distress.  Discharge Exam: Vitals:   08/06/24 1944 08/06/24 2321  BP: (!) 153/76 (!) 140/77  Pulse: 92 95  Resp: 19 (!) 45  Temp: 99.8 F (37.7 C) 98.8 F (37.1 C)  SpO2: 97% 94%   Vitals:   08/06/24 0930 08/06/24 1145 08/06/24 1944 08/06/24 2321  BP: (!) 148/94 (!) 165/91 (!) 153/76 (!) 140/77  Pulse: (!) 104 100 92 95  Resp: (!) 26 (!) 24 19 (!) 45  Temp:  (!) 100.4 F (38 C) 99.8 F (37.7 C) 98.8 F (37.1 C)  TempSrc:  Axillary Axillary Oral  SpO2:  96% 97% 94%  Weight:      Height:        General: Pt not alert or awake Cardiovascular: RRR, S1/S2  Respiratory: Diminished sounds bilaterally Abdominal: Soft, NT, ND, bowel sounds + Extremities: no edema, no cyanosis, contracture of bilateral hands    The results of significant diagnostics from this hospitalization (including imaging, microbiology, ancillary and laboratory) are listed below for reference.     Microbiology: Recent Results (from the past 240 hours)  Blood culture (routine x 2)     Status: None   Collection Time: 07/30/24 12:38 AM   Specimen: BLOOD RIGHT ARM  Result Value Ref Range Status   Specimen Description BLOOD RIGHT ARM  Final   Special Requests   Final    BOTTLES DRAWN AEROBIC AND ANAEROBIC Blood Culture  results may not be optimal due to an inadequate volume of blood received in culture bottles   Culture   Final    NO GROWTH 5 DAYS Performed at [facility], [address] [place], [place] [address]    Report Status 08/04/2024 FINAL  Final  Blood culture (routine x 2)     Status: None   Collection Time: 07/30/24 12:42 AM   Specimen: BLOOD LEFT ARM  Result Value Ref Range Status   Specimen Description BLOOD LEFT ARM  Final   Special Requests   Final    BOTTLES DRAWN AEROBIC AND ANAEROBIC Blood Culture results may not be optimal due to an inadequate volume of blood received in culture bottles   Culture   Final    NO GROWTH 5 DAYS Performed at [facility], [address], [place], [place] [address]    Report Status 08/04/2024 FINAL  Final  Resp panel by RT-PCR (RSV, Flu A&B, Covid) Anterior Nasal Swab     Status: None   Collection Time: 07/30/24  1:19 AM   Specimen: Anterior Nasal Swab  Result Value Ref Range Status   SARS Coronavirus 2 by RT PCR NEGATIVE NEGATIVE Final   Influenza A by PCR NEGATIVE NEGATIVE Final   Influenza B by PCR NEGATIVE NEGATIVE Final    Comment: (NOTE) The Xpert Xpress SARS-CoV-2/FLU/RSV plus assay is intended as an aid in the diagnosis of influenza from Nasopharyngeal swab specimens and should not be used as a sole basis for treatment. Nasal washings and aspirates are unacceptable for Xpert Xpress SARS-CoV-2/FLU/RSV testing.  Fact Sheet for Patients: bloggercourse.com  Fact Sheet for Healthcare Providers: seriousbroker.it  This test is not yet approved or cleared by the [place]  FDA and has been authorized for detection and/or diagnosis of SARS-CoV-2 by FDA under an Emergency Use Authorization (EUA). This EUA will remain in effect (meaning this test can be used) for the duration of the COVID-19 declaration under Section 564(b)(1) of the Act, 21 U.S.C. section 360bbb-3(b)(1), unless  the authorization is terminated or revoked.     Resp Syncytial Virus by PCR NEGATIVE NEGATIVE Final    Comment: (NOTE) Fact Sheet for Patients: bloggercourse.com  Fact Sheet for Healthcare Providers: seriousbroker.it  This test is not yet approved or cleared by the [place]  FDA and has been authorized for detection and/or diagnosis of SARS-CoV-2 by FDA under an Emergency Use Authorization (EUA). This EUA will remain in effect (meaning this test can be used) for the duration of the COVID-19 declaration under Section 564(b)(1) of the Act, 21 U.S.C. section 360bbb-3(b)(1), unless the authorization is terminated or revoked.  Performed at [facility], [address], [place], [place] [address]   Respiratory (~20 pathogens) panel by PCR     Status: Abnormal   Collection Time: 07/30/24 12:43 PM   Specimen: Nasopharyngeal Swab; Respiratory  Result Value Ref Range Status   Adenovirus NOT DETECTED NOT DETECTED Final   Coronavirus 229E NOT DETECTED NOT DETECTED Final    Comment: (NOTE) The Coronavirus on the Respiratory Panel, DOES NOT test for the novel  Coronavirus (2019 nCoV)    Coronavirus HKU1 NOT DETECTED NOT DETECTED Final   Coronavirus NL63 NOT DETECTED NOT DETECTED Final   Coronavirus OC43 NOT DETECTED NOT DETECTED Final   Metapneumovirus NOT DETECTED NOT DETECTED Final   Rhinovirus / Enterovirus NOT DETECTED NOT DETECTED Final   Influenza A NOT DETECTED NOT DETECTED Final   Influenza B NOT DETECTED NOT DETECTED Final   Parainfluenza Virus 1 DETECTED (A) NOT DETECTED Final   Parainfluenza Virus 2 NOT DETECTED NOT DETECTED Final   Parainfluenza Virus 3 NOT DETECTED NOT DETECTED Final   Parainfluenza Virus 4 NOT DETECTED NOT DETECTED Final   Respiratory Syncytial Virus NOT DETECTED NOT DETECTED  Final   Bordetella pertussis NOT DETECTED NOT DETECTED Final   Bordetella Parapertussis NOT DETECTED NOT DETECTED Final    Chlamydophila pneumoniae NOT DETECTED NOT DETECTED Final   Mycoplasma pneumoniae NOT DETECTED NOT DETECTED Final    Comment: Performed at [facility], [address], [place], [place] [address]  Culture, blood (Routine X 2) w Reflex to ID Panel     Status: None (Preliminary result)   Collection Time: 08/04/24  9:33 AM   Specimen: BLOOD LEFT ARM  Result Value Ref Range Status   Specimen Description BLOOD LEFT ARM  Final   Special Requests   Final    BOTTLES DRAWN AEROBIC AND ANAEROBIC Blood Culture results may not be optimal due to an inadequate volume of blood received in culture bottles   Culture   Final    NO GROWTH 3 DAYS Performed at [facility], [address], [place], [place] [address]    Report Status PENDING  Incomplete  Culture, blood (Routine X 2) w Reflex to ID Panel     Status: None (Preliminary result)   Collection Time: 08/04/24  9:40 AM   Specimen: BLOOD LEFT ARM  Result Value Ref Range Status   Specimen Description BLOOD LEFT ARM  Final   Special Requests   Final    BOTTLES DRAWN AEROBIC AND ANAEROBIC Blood Culture results may not be optimal due to an inadequate volume of blood received in culture bottles   Culture   Final    NO GROWTH 3 DAYS Performed at [facility], [address], [place], [place] [address]    Report Status PENDING  Incomplete     Labs: BNP (last 3 results) No results for input(s): BNP in the last 8760 hours. Basic Metabolic Panel: Recent Labs  Lab 07/31/24 1413 08/01/24 0532 08/02/24 0513 08/04/24 0136 08/05/24 0429 08/06/24 0448  NA  --  141 141 145 151* 149*  K  --  3.4* 3.6 3.4* 3.4* 3.8  CL  --  106 106 108 117* 114*  CO2  --  24 23 21* 22 22  GLUCOSE  --  116* 121* 159* 117* 181*  BUN  --  18 21 35* 38* 41*  CREATININE  --  0.61 0.53 0.64 0.49 0.65  CALCIUM  --  9.0 9.0 9.7 9.2 9.4  MG 1.6*  --   --   --  2.1  --   PHOS  --   --   --   --  2.3* 3.6   Liver Function Tests: No results for input(s):  AST, ALT, ALKPHOS, BILITOT, PROT, ALBUMIN in the last 168 hours. No results for input(s): LIPASE, AMYLASE in the last 168 hours. No results for input(s): AMMONIA in the last 168 hours. CBC: Recent Labs  Lab 08/01/24 0532 08/04/24 0136 08/05/24 0429 08/06/24 0448  WBC 5.6 16.9* 11.1* 12.8*  HGB 12.3 12.2 10.7* 11.1*  HCT 36.8 36.2 33.4* 35.1*  MCV 88.9 88.3 91.5 92.6  PLT 197 155 148* 193   Cardiac Enzymes: No results for input(s): CKTOTAL, CKMB, CKMBINDEX, TROPONINI in the last 168 hours. BNP: Invalid input(s): POCBNP CBG: No results for input(s): GLUCAP in the last 168 hours. D-Dimer No results for input(s): DDIMER in the last 72 hours. Hgb A1c No results for input(s): HGBA1C in the last 72 hours. Lipid Profile No results for input(s): CHOL, HDL, LDLCALC, TRIG, CHOLHDL, LDLDIRECT in the last 72 hours. Thyroid function studies No results for input(s): TSH,  T4TOTAL, T3FREE, THYROIDAB in the last 72 hours.  Invalid input(s): FREET3 Anemia work up No results for input(s): VITAMINB12, FOLATE, FERRITIN, TIBC, IRON, RETICCTPCT in the last 72 hours. Urinalysis    Component Value Date/Time   COLORURINE YELLOW 10/02/2023 1716   APPEARANCEUR HAZY (A) 10/02/2023 1716   LABSPEC 1.023 10/02/2023 1716   PHURINE 6.0 10/02/2023 1716   GLUCOSEU NEGATIVE 10/02/2023 1716   HGBUR NEGATIVE 10/02/2023 1716   BILIRUBINUR NEGATIVE 10/02/2023 1716   KETONESUR NEGATIVE 10/02/2023 1716   PROTEINUR NEGATIVE 10/02/2023 1716   UROBILINOGEN 1.0 01/17/2014 1616   NITRITE POSITIVE (A) 10/02/2023 1716   LEUKOCYTESUR NEGATIVE 10/02/2023 1716   Sepsis Labs Recent Labs  Lab 08/01/24 0532 08/04/24 0136 08/05/24 0429 08/06/24 0448  WBC 5.6 16.9* 11.1* 12.8*   Microbiology Recent Results (from the past 240 hours)  Blood culture (routine x 2)     Status: None   Collection Time: 07/30/24 12:38 AM   Specimen: BLOOD RIGHT ARM   Result Value Ref Range Status   Specimen Description BLOOD RIGHT ARM  Final   Special Requests   Final    BOTTLES DRAWN AEROBIC AND ANAEROBIC Blood Culture results may not be optimal due to an inadequate volume of blood received in culture bottles   Culture   Final    NO GROWTH 5 DAYS Performed at [facility], [address], [place], [place] [address]    Report Status 08/04/2024 FINAL  Final  Blood culture (routine x 2)     Status: None   Collection Time: 07/30/24 12:42 AM   Specimen: BLOOD LEFT ARM  Result Value Ref Range Status   Specimen Description BLOOD LEFT ARM  Final   Special Requests   Final    BOTTLES DRAWN AEROBIC AND ANAEROBIC Blood Culture results may not be optimal due to an inadequate volume of blood received in culture bottles   Culture   Final    NO GROWTH 5 DAYS Performed at [facility], [address] [place], [place] [address]    Report Status 08/04/2024 FINAL  Final  Resp panel by RT-PCR (RSV, Flu A&B, Covid) Anterior Nasal Swab     Status: None   Collection Time: 07/30/24  1:19 AM   Specimen: Anterior Nasal Swab  Result Value Ref Range Status   SARS Coronavirus 2 by RT PCR NEGATIVE NEGATIVE Final   Influenza A by PCR NEGATIVE NEGATIVE Final   Influenza B by PCR NEGATIVE NEGATIVE Final    Comment: (NOTE) The Xpert Xpress SARS-CoV-2/FLU/RSV plus assay is intended as an aid in the diagnosis of influenza from Nasopharyngeal swab specimens and should not be used as a sole basis for treatment. Nasal washings and aspirates are unacceptable for Xpert Xpress SARS-CoV-2/FLU/RSV testing.  Fact Sheet for Patients: bloggercourse.com  Fact Sheet for Healthcare Providers: seriousbroker.it  This test is not yet approved or cleared by the [place]  FDA and has been authorized for detection and/or diagnosis of SARS-CoV-2 by FDA under an Emergency Use Authorization (EUA). This EUA will remain in  effect (meaning this test can be used) for the duration of the COVID-19 declaration under Section 564(b)(1) of the Act, 21 U.S.C. section 360bbb-3(b)(1), unless the authorization is terminated or revoked.     Resp Syncytial Virus by PCR NEGATIVE NEGATIVE Final    Comment: (NOTE) Fact Sheet for Patients: bloggercourse.com  Fact Sheet for Healthcare Providers: seriousbroker.it  This test is not yet approved or cleared by the [place]  FDA and  has been authorized for detection and/or diagnosis of SARS-CoV-2 by FDA under an Emergency Use Authorization (EUA). This EUA will remain in effect (meaning this test can be used) for the duration of the COVID-19 declaration under Section 564(b)(1) of the Act, 21 U.S.C. section 360bbb-3(b)(1), unless the authorization is terminated or revoked.  Performed at [facility], [address], [place], [place] [address]   Respiratory (~20 pathogens) panel by PCR     Status: Abnormal   Collection Time: 07/30/24 12:43 PM   Specimen: Nasopharyngeal Swab; Respiratory  Result Value Ref Range Status   Adenovirus NOT DETECTED NOT DETECTED Final   Coronavirus 229E NOT DETECTED NOT DETECTED Final    Comment: (NOTE) The Coronavirus on the Respiratory Panel, DOES NOT test for the novel  Coronavirus (2019 nCoV)    Coronavirus HKU1 NOT DETECTED NOT DETECTED Final   Coronavirus NL63 NOT DETECTED NOT DETECTED Final   Coronavirus OC43 NOT DETECTED NOT DETECTED Final   Metapneumovirus NOT DETECTED NOT DETECTED Final   Rhinovirus / Enterovirus NOT DETECTED NOT DETECTED Final   Influenza A NOT DETECTED NOT DETECTED Final   Influenza B NOT DETECTED NOT DETECTED Final   Parainfluenza Virus 1 DETECTED (A) NOT DETECTED Final   Parainfluenza Virus 2 NOT DETECTED NOT DETECTED Final   Parainfluenza Virus 3 NOT DETECTED NOT DETECTED Final   Parainfluenza Virus 4 NOT DETECTED NOT DETECTED Final   Respiratory  Syncytial Virus NOT DETECTED NOT DETECTED Final   Bordetella pertussis NOT DETECTED NOT DETECTED Final   Bordetella Parapertussis NOT DETECTED NOT DETECTED Final   Chlamydophila pneumoniae NOT DETECTED NOT DETECTED Final   Mycoplasma pneumoniae NOT DETECTED NOT DETECTED Final    Comment: Performed at [facility], [address] [place], [place] [address]  Culture, blood (Routine X 2) w Reflex to ID Panel     Status: None (Preliminary result)   Collection Time: 08/04/24  9:33 AM   Specimen: BLOOD LEFT ARM  Result Value Ref Range Status   Specimen Description BLOOD LEFT ARM  Final   Special Requests   Final    BOTTLES DRAWN AEROBIC AND ANAEROBIC Blood Culture results may not be optimal due to an inadequate volume of blood received in culture bottles   Culture   Final    NO GROWTH 3 DAYS Performed at [facility], [address], [place], [place] [address]    Report Status PENDING  Incomplete  Culture, blood (Routine X 2) w Reflex to ID Panel     Status: None (Preliminary result)   Collection Time: 08/04/24  9:40 AM   Specimen: BLOOD LEFT ARM  Result Value Ref Range Status   Specimen Description BLOOD LEFT ARM  Final   Special Requests   Final    BOTTLES DRAWN AEROBIC AND ANAEROBIC Blood Culture results may not be optimal due to an inadequate volume of blood received in culture bottles   Culture   Final    NO GROWTH 3 DAYS Performed at [facility], [address] [place], [place] [address]    Report Status PENDING  Incomplete    Please note: You were cared for by a hospitalist during your hospital stay. Once you are discharged, your primary care physician will handle any further medical issues. Please note that NO REFILLS for any discharge medications will be authorized once you are discharged, as it is imperative that you return to your primary care physician (or establish a relationship with a  primary care physician if you do not have one) for your post hospital  discharge needs so that they can reassess your need for medications and monitor your lab values.    Time coordinating discharge: 40 minutes  SIGNED:   Ivonne Mustache, MD  [facility] 08/07/2024, 11:27 AM Pager [phone]  If 7PM-7AM, please contact night-coverage [email or web address] Password [email or web address]    [1]  Allergies Allergen Reactions   Galantamine     Unknown per mar   Tuberculin Ppd     unknown

## 2024-08-07 NOTE — TOC Transition Note (Signed)
 Transition of Care [facility]) - Discharge Note   Patient Details  Name: Sherri Gross MRN: [id number] Date of Birth: 26-Nov-1945  Transition of Care [facility]) CM/SW Contact:  Isaiah Public, LCSWA Phone Number: 08/07/2024, 11:41 AM   Clinical Narrative:     Patient will DC to: [facility]   Anticipated DC date: 08/07/2024  Family notified: [name] (sister/Legal Guardian)  Transport by: [facility]  ?  Per MD patient ready for DC to [facility] . RN, patient, patient's family, and facility notified of DC. Discharge Summary sent to facility. RN given number for report tele# [phone]. DC packet on chart. DNR signed by MD. Attached to patients DC packet.Ambulance transport requested for patient.  CSW signing off.   Final next level of care: Hospice Medical Facility Barriers to Discharge: No Barriers Identified   Patient Goals and CMS Choice     Choice offered to / list presented to : Sibling, HC POA / Guardian (sister [name])      Discharge Placement              Patient chooses bed at:  [facility]) Patient to be transferred to facility by: [facility] Name of family member notified: [name] Patient and family notified of of transfer: 08/07/24  Discharge Plan and Services Additional resources added to the After Visit Summary for   In-house Referral: Clinical Social Work                                   Social Drivers of Health (SDOH) Interventions SDOH Screenings   Food Insecurity: No Food Insecurity (07/30/2024)  Housing: Low Risk (07/30/2024)  Transportation Needs: No Transportation Needs (07/30/2024)  Utilities: Not At Risk (07/30/2024)  Social Connections: Socially Isolated (07/30/2024)  Tobacco Use: Low Risk (07/30/2024)     Readmission Risk Interventions     No data to display

## 2024-08-07 NOTE — Progress Notes (Signed)
 " Daily Progress Note   Date: 08/07/2024   Patient Name: Sherri Gross  DOB: 03/23/1946  MRN: [id number]  Age / Sex: 79 y.o., female  Attending Physician: Jillian Buttery, MD Primary Care Physician: Hilma Philis HERO Admit Date: 07/29/2024 Length of Stay: 8 days  Reason for Follow-up: Establishing goals of care  Past Medical History:  Diagnosis Date   Arthritis    Cataracts, bilateral    Cerebellar ataxia (HCC)    Dementia (HCC)    Hearing loss    Hypercholesteremia    Hypertension    IDDM (insulin dependent diabetes mellitus)    Osteoarthritis    Presbyopia    Psychotic disorder [facility])     Assessment & Plan:   HPI/Patient Profile:   79 y.o. female  with past medical history of advanced dementia, hearing loss, hypertension, hyperlipidemia, DM2, remote psychosis admitted on 07/29/2024 with hypoxia from SNF.    Patient was found to have viral infection/pneumonia in the ED.  She was hypotensive and had leukocytosis, lactic acidosis which improved, elevated troponin which increased from 365-578.  She had CT of the chest that showed mild interstitial edema, upper lobe opacities concerning with edema and/or pneumonitis.  She was admitted with acute hypoxic respiratory failure and completed antibiotics.  NSTEMI was ultimately ruled out.  Unfortunately, she had acute worsening of her clinical status today with respiratory distress and increased oxygen requirements, suspect aspiration.  She was started back on antibiotics.   PMT has been consulted to assist with goals of care conversation.  Patient received morphine  2 mg IV PRN in the last 24 hours. Remains comfortable with minimal symptom management. Spoke with primary RN who shared she is concerned about the patient's diet order and worry that someone will attempt to feed the patient with aspiration. Discontinued the patient's diet order to prevent trays being delivered and ordered comfort feeds.   SUMMARY OF  RECOMMENDATIONS DNR-comfort Pending discharge to hospice - [facility]  Comfort feeds only, no diet ordered due to aspiration concern Symptom management as below  Symptom Management:  Morphine  pain/dyspnea/increased work of breathing/RR>25 Robinul  PRN secretions Haldol  PRN agitation/delirium Ativan  PRN anxiety/seizure/sleep/distress Zofran  PRN nausea/vomiting Liquifilm Tears PRN dry eye  Code Status: DNR - Comfort  Prognosis: < 2 weeks  Discharge Planning: Hospice facility  Subjective:   Subjective: Chart Reviewed. Updates received. Patient Assessed. Created space and opportunity for patient  and family to explore thoughts and feelings regarding current medical situation.  Today's Discussion:  Met with the patient at bedside today without any visitors present. Patient was asleep but awoke to voice. Patient does not speak which is baseline. Looked comfortable with minimal symptom management. Patient remains stable for transfer to hospice facility when bed available.  Review of Systems  Unable to perform ROS   Objective:   Primary Diagnoses: Present on Admission: **None**   Vital Signs:  BP (!) 140/77 (BP Location: Left Arm)   Pulse 95   Temp 98.8 F (37.1 C) (Oral)   Resp (!) 45   Ht 5' 5 (1.651 m)   Wt 65.8 kg   SpO2 94%   BMI 24.13 kg/m   Physical Exam Constitutional:      Appearance: She is ill-appearing.  HENT:     Head: Normocephalic.     Nose: Nose normal.     Mouth/Throat:     Comments: Edentulous  Eyes:     Extraocular Movements: Extraocular movements intact.  Cardiovascular:     Rate and Rhythm: Normal rate.  Pulses: Normal pulses.  Pulmonary:     Effort: Pulmonary effort is normal.  Skin:    General: Skin is warm.  Neurological:     Mental Status: She is alert. Mental status is at baseline.     Palliative Assessment/Data: 10%   Existing Vynca/ACP Documentation: Legal Guardian - sister Sherri Gross  Thank you for allowing us   to participate in the care of Sherri Gross PMT will continue to support holistically.  I personally spent a total of 25 minutes in the care of the patient today including preparing to see the patient, getting/reviewing separately obtained history, performing a medically appropriate exam/evaluation, placing orders, and documenting clinical information in the EHR.   Denali Becvar E Nicodemus Denk, PA-C  Palliative Medicine Team  Team Phone # [phone] (Nights/Weekends) 08/07/2024 11:01 AM  "

## 2024-08-07 NOTE — TOC Progression Note (Signed)
 Transition of Care [facility]) - Progression Note    Patient Details  Name: Sherri Gross MRN: [id number] Date of Birth: 09/27/1945  Transition of [facility]) CM/SW Contact  Isaiah Public, LCSWA Phone Number: 08/07/2024, 10:49 AM  Clinical Narrative:     CSW received a call from [facility] RN Liaison who informed CSW that patient was approved for [facility]. Rosina informed CSW that she is going to reach out to patients legal guardian/sister [name] and get consents signed. Rosina informed CSW that facility has bed for patient today. CSW informed MD. CSW will continue to follow.  Expected Discharge Plan: Group Home Barriers to Discharge: Continued Medical Work up               Expected Discharge Plan and Services In-house Referral: Clinical Social Work     Living arrangements for the past 2 months: Group Home                                       Social Drivers of Health (SDOH) Interventions SDOH Screenings   Food Insecurity: No Food Insecurity (07/30/2024)  Housing: Low Risk (07/30/2024)  Transportation Needs: No Transportation Needs (07/30/2024)  Utilities: Not At Risk (07/30/2024)  Social Connections: Socially Isolated (07/30/2024)  Tobacco Use: Low Risk (07/30/2024)    Readmission Risk Interventions     No data to display

## 2024-08-09 LAB — CULTURE, BLOOD (ROUTINE X 2)
Culture: NO GROWTH
Culture: NO GROWTH
